# Patient Record
Sex: Male | Born: 1955
Health system: Southern US, Community
[De-identification: ages and names within clinical notes are randomized; demographics above are authoritative.]

## PROBLEM LIST (undated history)

## (undated) DIAGNOSIS — Z972 Presence of dental prosthetic device (complete) (partial): Secondary | ICD-10-CM

## (undated) DIAGNOSIS — N429 Disorder of prostate, unspecified: Secondary | ICD-10-CM

## (undated) DIAGNOSIS — M549 Dorsalgia, unspecified: Secondary | ICD-10-CM

## (undated) DIAGNOSIS — G4700 Insomnia, unspecified: Secondary | ICD-10-CM

## (undated) DIAGNOSIS — K08109 Complete loss of teeth, unspecified cause, unspecified class: Secondary | ICD-10-CM

## (undated) DIAGNOSIS — H269 Unspecified cataract: Secondary | ICD-10-CM

## (undated) DIAGNOSIS — K635 Polyp of colon: Secondary | ICD-10-CM

## (undated) DIAGNOSIS — J449 Chronic obstructive pulmonary disease, unspecified: Secondary | ICD-10-CM

## (undated) DIAGNOSIS — G479 Sleep disorder, unspecified: Secondary | ICD-10-CM

## (undated) DIAGNOSIS — F17200 Nicotine dependence, unspecified, uncomplicated: Secondary | ICD-10-CM

## (undated) DIAGNOSIS — K4090 Unilateral inguinal hernia, without obstruction or gangrene, not specified as recurrent: Secondary | ICD-10-CM

## (undated) DIAGNOSIS — N400 Enlarged prostate without lower urinary tract symptoms: Secondary | ICD-10-CM

## (undated) DIAGNOSIS — G8929 Other chronic pain: Secondary | ICD-10-CM

## (undated) DIAGNOSIS — Z87891 Personal history of nicotine dependence: Principal | ICD-10-CM

## (undated) DIAGNOSIS — M5136 Other intervertebral disc degeneration, lumbar region: Principal | ICD-10-CM

## (undated) HISTORY — PX: COLONOSCOPY W/ BIOPSIES AND POLYPECTOMY: SHX1376

## (undated) HISTORY — DX: Dorsalgia, unspecified: M54.9

## (undated) HISTORY — DX: Benign prostatic hyperplasia without lower urinary tract symptoms: N40.0

## (undated) HISTORY — DX: Other intervertebral disc degeneration, lumbar region: M51.36

## (undated) HISTORY — DX: Sleep disorder, unspecified: G47.9

## (undated) HISTORY — PX: HERNIA REPAIR: SHX51

## (undated) HISTORY — DX: Insomnia, unspecified: G47.00

## (undated) HISTORY — DX: Other chronic pain: G89.29

## (undated) HISTORY — PX: GANGLION CYST EXCISION: SHX1691

## (undated) HISTORY — DX: Personal history of nicotine dependence: Z87.891

## (undated) HISTORY — PX: TOOTH EXTRACTION: SUR596

## (undated) HISTORY — DX: Polyp of colon: K63.5

## (undated) HISTORY — DX: Disorder of prostate, unspecified: N42.9

## (undated) HISTORY — DX: Nicotine dependence, unspecified, uncomplicated: F17.200

---

## 2002-10-04 DIAGNOSIS — K4090 Unilateral inguinal hernia, without obstruction or gangrene, not specified as recurrent: Secondary | ICD-10-CM

## 2002-10-04 HISTORY — DX: Unilateral inguinal hernia, without obstruction or gangrene, not specified as recurrent: K40.90

## 2014-09-02 ENCOUNTER — Telehealth: Payer: Self-pay | Admitting: Family Medicine

## 2014-09-02 MED ORDER — TAMSULOSIN HCL 0.4 MG PO CAPS: 0.4000 mg | ORAL_CAPSULE | Freq: Every day | ORAL | Status: DC

## 2014-09-02 NOTE — Telephone Encounter (Signed)
I called the pt and he stated he is taking this for prostate problems (difficulty with urine flow) and I informed him Dr Maudie Mercury approved this and the Rx was sent to his pharmacy.

## 2014-09-02 NOTE — Telephone Encounter (Signed)
Please find out what he is taking this for. If for benign prostatic hypertrophy can refill #15, 0 refills to get to appointment but no further refills if not seen that day. Advise he get here 15 minutes prior to that appointment.

## 2014-09-02 NOTE — Telephone Encounter (Signed)
Pt calling to request refill of tamsulosine 0.4 mg.  Pt was informed that since he has not seen Dr. Maudie Mercury yet and will not see her until 09/16/14, she is unable to prescribe medications until he is an established pt.  Pt states he has to have this medication and the doctor he was seeing in [place] will not refill medication.  Pt wants to know what he shall do in the mean time.

## 2014-09-16 ENCOUNTER — Ambulatory Visit (INDEPENDENT_AMBULATORY_CARE_PROVIDER_SITE_OTHER): Payer: Managed Care, Other (non HMO) | Admitting: Family Medicine

## 2014-09-16 ENCOUNTER — Encounter: Payer: Self-pay | Admitting: Family Medicine

## 2014-09-16 VITALS — BP 112/76 | HR 91 | Temp 98.2°F | Ht 72.5 in | Wt 160.9 lb

## 2014-09-16 DIAGNOSIS — Z7689 Persons encountering health services in other specified circumstances: Secondary | ICD-10-CM

## 2014-09-16 DIAGNOSIS — F17200 Nicotine dependence, unspecified, uncomplicated: Secondary | ICD-10-CM | POA: Insufficient documentation

## 2014-09-16 DIAGNOSIS — G4700 Insomnia, unspecified: Secondary | ICD-10-CM

## 2014-09-16 DIAGNOSIS — M5136 Other intervertebral disc degeneration, lumbar region: Secondary | ICD-10-CM

## 2014-09-16 DIAGNOSIS — N400 Enlarged prostate without lower urinary tract symptoms: Secondary | ICD-10-CM

## 2014-09-16 DIAGNOSIS — G8929 Other chronic pain: Secondary | ICD-10-CM

## 2014-09-16 DIAGNOSIS — Z720 Tobacco use: Secondary | ICD-10-CM

## 2014-09-16 DIAGNOSIS — Z7189 Other specified counseling: Secondary | ICD-10-CM

## 2014-09-16 DIAGNOSIS — M549 Dorsalgia, unspecified: Secondary | ICD-10-CM

## 2014-09-16 DIAGNOSIS — M51369 Other intervertebral disc degeneration, lumbar region without mention of lumbar back pain or lower extremity pain: Secondary | ICD-10-CM

## 2014-09-16 DIAGNOSIS — Z23 Encounter for immunization: Secondary | ICD-10-CM

## 2014-09-16 DIAGNOSIS — M545 Low back pain: Secondary | ICD-10-CM

## 2014-09-16 HISTORY — DX: Other intervertebral disc degeneration, lumbar region: M51.36

## 2014-09-16 HISTORY — DX: Benign prostatic hyperplasia without lower urinary tract symptoms: N40.0

## 2014-09-16 HISTORY — DX: Other chronic pain: G89.29

## 2014-09-16 HISTORY — DX: Insomnia, unspecified: G47.00

## 2014-09-16 HISTORY — DX: Nicotine dependence, unspecified, uncomplicated: F17.200

## 2014-09-16 HISTORY — DX: Other intervertebral disc degeneration, lumbar region without mention of lumbar back pain or lower extremity pain: M51.369

## 2014-09-16 MED ORDER — BUPROPION HCL ER (SR) 150 MG PO TB12: 150.0000 mg | ORAL_TABLET | Freq: Every day | ORAL | Status: DC

## 2014-09-16 NOTE — Progress Notes (Signed)
Pre visit review using our clinic review tool, if applicable. No additional management support is needed unless otherwise documented below in the visit note. 

## 2014-09-16 NOTE — Addendum Note (Signed)
Addended by: Agnes Lawrence on: 09/16/2014 03:39 PM   Modules accepted: Orders

## 2014-09-16 NOTE — Patient Instructions (Addendum)
BEFORE YOU LEAVE: -flu shot -schedule physical exam in the spring  Congratulation on deciding to quit smoking! -start the wellbutrin once daily -stop smoking 1 week after starting wellbutrin -stop wellbutrin 3 months later  -We placed a referral for you as discussed to the orthopedic doctor for you history of disc disease and chronic back pain. I do not prescribe tramadol or opoid pain medications for chronic back pain. It usually takes about 1-2 weeks to process and schedule this referral. If you have not heard from [place] regarding this appointment in 2 weeks please contact our office.  -Please try to taper off of the xanax slowly and seek care with a psychiatrist if unable to stop this medication. I do not prescribe this medication for sleep or for chronic use.See below for other safer options for sleep.  We recommend the following healthy lifestyle measures: - eat a healthy diet consisting of lots of vegetables, fruits, beans, nuts, seeds, healthy meats such as white chicken and fish and whole grains.  - avoid fried foods, fast food, processed foods, sodas, red meet and other fattening foods.  - get a least 150 minutes of aerobic exercise per week.   We advise that you please obtain and provide [place] with the following so that we can provide you with the best care possible:  NOTE: please provide ONLY the requested documents and reserve a copy for yourself  -copies of all vaccines -reports from any heart studies, EKGs, xrays, MRIs, CT scans, ultrasounds or biopsies -any lab tests done in the last 2 years -reports from any gastrointestinal studies such as colonoscopy or endoscopy -physical exam doctor notes from your last physical -the most recent office visit note from any specialist you have seen  Thank you.  FOR IMPROVED SLEEP AND TO RESET YOUR SLEEP SCHEDULE: []  exercise 30 minutes daily  []  go to bed and wake up at the same time  []  keep bedroom cool, dark and quiet  []  reserve  bed for sleep - do not read, watch TV, etc in bed  []  If you toss and turn more then 15-20 minutes get out of bed and list thoughts/do quite activity then go back to bed; repeat as needed; do not worry about when you eventually fall asleep - still get up at the same time and turn on lights and take shower  [] get counseling  []  some people find that a half dose of benadryl, melatonin, tylenol pm or unisom on a few nights per week is helpful initially for a few weeks  [] seek help and treat any depression or anxiety  [] prescription strength sleep medications should only be used in severe cases of insomnia if other measures fail and should be used sparingly

## 2014-09-16 NOTE — Progress Notes (Signed)
HPI:  Don Christian is here to establish care. New to [place] from [place]. Reports was doing labs and physical yearly.  Had back injury at work in 2012 lifting something. Reports disabled for this. Reports has DDD. Chronic low back pain on Tramadol for this. Worse with sitting and driving. Occ pain in L leg. Denies weakness of numbness, bowel or bladder incontinence. Takes flexeril 2 times per day and tramadol 50mg  bid and ibuprofen bid. Seems like helps a little.  Tobacco Use: -smokes about 3 cigars per day -wants to take wellbutrin to help him quit -started in 2012, in the past had smoke for 20 years -he was getting a low dose CT scan yearly - parents and sister were smokers and died of lung cancer - reports last scan in the spring 2015  Insomnia: -takes alprazolam 0.25mg  5 nights per week -reports possible mild depression -mild anxiety but every once and awhile  BPH: -uses flomax for this -was seeing a urologist for this and wants to see a urologist here - wants referral to urologist next visit  Has the following chronic problems that require follow up and concerns today:   ROS negative for unless reported above: fevers, unintentional weight loss, hearing or vision loss, chest pain, palpitations, struggling to breath, hemoptysis, melena, hematochezia, hematuria, falls, loc, si, thoughts of self harm  Past Medical History  Diagnosis Date  . Depression   . Colon polyps   . Sleeping difficulties   . Prostate disorder   . DDD (degenerative disc disease), lumbar 09/16/2014  . BPH (benign prostatic hyperplasia) 09/16/2014  . Tobacco use disorder 09/16/2014  . Insomnia 09/16/2014  . Chronic back pain 09/16/2014    Past Surgical History  Procedure Laterality Date  . Hernia repair      Family History  Problem Relation Age of Onset  . Cancer Mother   . Heart disease Mother   . Cancer Father   . Cancer Sister     History   Social History  . Marital Status:  Unknown    Spouse Name: N/A    Number of Children: N/A  . Years of Education: N/A   Social History Main Topics  . Smoking status: Current Every Day Smoker  . Smokeless tobacco: None  . Alcohol Use: 0.0 oz/week    0 Not specified per week     Comment: 4-5 drinks 2 days per week  . Drug Use: No  . Sexual Activity: None   Other Topics Concern  . None   Social History Narrative   Work or School: no work currently, disabled from back injury in 2012      Home Situation: lives with wife      Spiritual Beliefs: none      Lifestyle: walks dog 2x per day;  Healthy diet per his report          Current outpatient prescriptions: ALPRAZolam (XANAX) 0.25 MG tablet, Take 0.25 mg by mouth at bedtime., Disp: , Rfl: ;  cyclobenzaprine (FLEXERIL) 10 MG tablet, Take 10 mg by mouth 3 (three) times daily as needed for muscle spasms., Disp: , Rfl: ;  ibuprofen (ADVIL,MOTRIN) 800 MG tablet, Take 800 mg by mouth 2 (two) times daily., Disp: , Rfl:  tamsulosin (FLOMAX) 0.4 MG CAPS capsule, Take 1 capsule (0.4 mg total) by mouth daily., Disp: 15 capsule, Rfl: 0;  traMADol (ULTRAM) 50 MG tablet, Take by mouth 3 (three) times daily. Back pain, Disp: , Rfl: ;  buPROPion (WELLBUTRIN SR) 150  MG 12 hr tablet, Take 1 tablet (150 mg total) by mouth daily., Disp: 90 tablet, Rfl: 0  EXAM:  Filed Vitals:   09/16/14 1428  BP: 112/76  Pulse: 91  Temp: 98.2 F (36.8 C)    Body mass index is 21.51 kg/(m^2).  GENERAL: vitals reviewed and listed above, alert, oriented, appears well hydrated and in no acute distress  HEENT: atraumatic, conjunttiva clear, no obvious abnormalities on inspection of external nose and ears  NECK: no obvious masses on inspection  LUNGS: clear to auscultation bilaterally, no wheezes, rales or rhonchi, good air movement  CV: HRRR, no peripheral edema  MS: moves all extremities without noticeable abnormality  PSYCH: pleasant and cooperative, no obvious depression or  anxiety  ASSESSMENT AND PLAN:  Discussed the following assessment and plan:  DDD (degenerative disc disease), lumbar - Plan: Ambulatory referral to Orthopedic Surgery Chronic low back pain - Plan: Ambulatory referral to Orthopedic Surgery -referred to ortho for is chronic debilitating back pain, advised I do not prescribe tramadol or other opoids for chronic use in adults -advised he obtain records  Tobacco use disorder - Plan: buPROPion (WELLBUTRIN SR) 150 MG 12 hr tablet -advised to quit, counseled 3-10 minutes -wellbutrin, supported  Encounter to establish care  -We reviewed the PMH, PSH, FH, SH, Meds and Allergies. -We provided refills for any medications we will prescribe as needed. -We addressed current concerns per orders and patient instructions. -We have asked for records for pertinent exams, studies, vaccines and notes from previous providers. -We have advised patient to follow up per instructions below.   -Patient advised to return or notify a doctor immediately if symptoms worsen or persist or new concerns arise.  Patient Instructions  BEFORE YOU LEAVE: -flu shot -schedule physical exam in the spring  Congratulation on deciding to quit smoking! -start the wellbutrin once daily -stop smoking 1 week after starting wellbutrin -stop wellbutrin 3 months later  -We placed a referral for you as discussed to the orthopedic doctor for you history of disc disease and chronic back pain. I do not prescribe tramadol or opoid pain medications for chronic back pain. It usually takes about 1-2 weeks to process and schedule this referral. If you have not heard from [place] regarding this appointment in 2 weeks please contact our office.  -Please try to taper off of the xanax slowly and seek care with a psychiatrist if unable to stop this medication. I do not prescribe this medication for sleep or for chronic use.See below for other safer options for sleep.  We recommend the following  healthy lifestyle measures: - eat a healthy diet consisting of lots of vegetables, fruits, beans, nuts, seeds, healthy meats such as white chicken and fish and whole grains.  - avoid fried foods, fast food, processed foods, sodas, red meet and other fattening foods.  - get a least 150 minutes of aerobic exercise per week.   We advise that you please obtain and provide [place] with the following so that we can provide you with the best care possible:  NOTE: please provide ONLY the requested documents and reserve a copy for yourself  -copies of all vaccines -reports from any heart studies, EKGs, xrays, MRIs, CT scans, ultrasounds or biopsies -any lab tests done in the last 2 years -reports from any gastrointestinal studies such as colonoscopy or endoscopy -physical exam doctor notes from your last physical -the most recent office visit note from any specialist you have seen  Thank you.  FOR IMPROVED  SLEEP AND TO RESET YOUR SLEEP SCHEDULE: []  exercise 30 minutes daily  []  go to bed and wake up at the same time  []  keep bedroom cool, dark and quiet  []  reserve bed for sleep - do not read, watch TV, etc in bed  []  If you toss and turn more then 15-20 minutes get out of bed and list thoughts/do quite activity then go back to bed; repeat as needed; do not worry about when you eventually fall asleep - still get up at the same time and turn on lights and take shower  [] get counseling  []  some people find that a half dose of benadryl, melatonin, tylenol pm or unisom on a few nights per week is helpful initially for a few weeks  [] seek help and treat any depression or anxiety  [] prescription strength sleep medications should only be used in severe cases of insomnia if other measures fail and should be used sparingly             KIM, Jarrett Soho R.

## 2014-09-17 ENCOUNTER — Telehealth: Payer: Self-pay | Admitting: Family Medicine

## 2014-09-17 ENCOUNTER — Other Ambulatory Visit: Payer: Self-pay | Admitting: Family Medicine

## 2014-09-17 NOTE — Telephone Encounter (Signed)
emmi emailed °

## 2014-09-17 NOTE — Telephone Encounter (Signed)
Dr. Maudie Mercury, okay to refill Tamsulosin?

## 2014-09-26 ENCOUNTER — Telehealth: Payer: Self-pay | Admitting: Family Medicine

## 2014-09-26 MED ORDER — TAMSULOSIN HCL 0.4 MG PO CAPS: 0.4000 mg | ORAL_CAPSULE | Freq: Every day | ORAL | Status: DC

## 2014-09-26 NOTE — Telephone Encounter (Signed)
1.Pt states he takes tamsulosin (FLOMAX) 0.4 MG CAPS capsule daily (for his prostate) and this rx done only for 15 days. pls advise. [facility]/ [place]  2. Pt states dr Maudie Mercury also reccommended a massage therapist for him. Pt does not remember and hopes dr Maudie Mercury can. pls advise.

## 2014-09-26 NOTE — Telephone Encounter (Signed)
Patient informed. 

## 2014-09-26 NOTE — Telephone Encounter (Signed)
flomax sent - not sure why only 15 sent when refilled. Please let him know have heard good things about [employer] [employer] for massage therapy. Thanks.

## 2014-10-17 ENCOUNTER — Other Ambulatory Visit: Payer: Self-pay | Admitting: Neurological Surgery

## 2014-10-17 DIAGNOSIS — M545 Low back pain: Secondary | ICD-10-CM

## 2014-11-04 ENCOUNTER — Other Ambulatory Visit: Payer: Self-pay

## 2014-11-15 ENCOUNTER — Other Ambulatory Visit: Payer: Self-pay | Admitting: Neurological Surgery

## 2014-11-27 ENCOUNTER — Inpatient Hospital Stay (HOSPITAL_COMMUNITY)
Admission: RE | Admit: 2014-11-27 | Discharge: 2014-11-27 | Disposition: A | Discharge status: Home / Self Care | Payer: Self-pay | Source: Ambulatory Visit

## 2014-11-27 ENCOUNTER — Other Ambulatory Visit (HOSPITAL_COMMUNITY): Payer: Self-pay | Admitting: *Deleted

## 2014-11-27 NOTE — Pre-Procedure Instructions (Signed)
Don Christian  11/27/2014   Your procedure is scheduled on:  Thursday, December 05, 2014 at 7:30 AM.   Report to [facility] Entrance "A" Admitting Office at 5:30 AM.   Call this number if you have problems the morning of surgery: [phone]               Any questions prior to day of surgery, please call [phone] between 8 & 4 PM.   Remember:   Do not eat food or drink liquids after midnight Wednesday, 12/04/14.   Take these medicines the morning of surgery with A SIP OF WATER: Bupropion (Wellbutrin SR), Tamsulosin (Flomax), Tramadol - if needed, Cyclobenzaprine (Flexeril) - if needed.  Stop NSAIDS (Ibuprofen, Aleve, etc.) as of today.   Do not wear jewelry.  Do not wear lotions, powders, or cologne. You may wear deodorant.  Men may shave face and neck.  Do not bring valuables to the hospital.  [facility] is not responsible                  for any belongings or valuables.               Contacts, dentures or bridgework may not be worn into surgery.  Leave suitcase in the car. After surgery it may be brought to your room.  For patients admitted to the hospital, discharge time is determined by your                treatment team.               Special Instructions: [place] - Preparing for Surgery  Before surgery, you can play an important role.  Because skin is not sterile, your skin needs to be as free of germs as possible.  You can reduce the number of germs on you skin by washing with CHG (chlorahexidine gluconate) soap before surgery.  CHG is an antiseptic cleaner which kills germs and bonds with the skin to continue killing germs even after washing.  Please DO NOT use if you have an allergy to CHG or antibacterial soaps.  If your skin becomes reddened/irritated stop using the CHG and inform your nurse when you arrive at Short Stay.  Do not shave (including legs and underarms) for at least 48 hours prior to the first CHG shower.  You may shave your face.  Please  follow these instructions carefully:   1.  Shower with CHG Soap the night before surgery and the                                morning of Surgery.  2.  If you choose to wash your hair, wash your hair first as usual with your       normal shampoo.  3.  After you shampoo, rinse your hair and body thoroughly to remove the                      Shampoo.  4.  Use CHG as you would any other liquid soap.  You can apply chg directly       to the skin and wash gently with scrungie or a clean washcloth.  5.  Apply the CHG Soap to your body ONLY FROM THE NECK DOWN.        Do not use on open wounds or open sores.  Avoid contact with your eyes, ears,  mouth and genitals (private parts).  Wash genitals (private parts) with your normal soap.  6.  Wash thoroughly, paying special attention to the area where your surgery        will be performed.  7.  Thoroughly rinse your body with warm water from the neck down.  8.  DO NOT shower/wash with your normal soap after using and rinsing off       the CHG Soap.  9.  Pat yourself dry with a clean towel.            10.  Wear clean pajamas.            11.  Place clean sheets on your bed the night of your first shower and do not        sleep with pets.  Day of Surgery  Do not apply any lotions the morning of surgery.  Please wear clean clothes to the hospital.     Please read over the following fact sheets that you were given: Pain Booklet, Coughing and Deep Breathing, Blood Transfusion Information, MRSA Information and Surgical Site Infection Prevention

## 2014-12-03 ENCOUNTER — Inpatient Hospital Stay (HOSPITAL_COMMUNITY): Admission: RE | Admit: 2014-12-03 | Payer: Self-pay | Source: Ambulatory Visit

## 2014-12-05 ENCOUNTER — Encounter (HOSPITAL_COMMUNITY): Admission: RE | Payer: Self-pay | Source: Ambulatory Visit

## 2014-12-05 ENCOUNTER — Inpatient Hospital Stay (HOSPITAL_COMMUNITY)
Admission: RE | Admit: 2014-12-05 | Payer: Managed Care, Other (non HMO) | Source: Ambulatory Visit | Admitting: Neurological Surgery

## 2014-12-05 SURGERY — FOR MAXIMUM ACCESS (MAS) POSTERIOR LUMBAR INTERBODY FUSION (PLIF) 1 LEVEL
Anesthesia: General | Site: Back

## 2014-12-16 ENCOUNTER — Telehealth: Payer: Self-pay | Admitting: Family Medicine

## 2014-12-16 NOTE — Telephone Encounter (Signed)
Patient informed of the message and stated he has not seen a psychiatrist and he has an appt on 4/14 for his physical and will discuss this at this time.

## 2014-12-16 NOTE — Telephone Encounter (Signed)
I advised him at his appointment I did not advise this for sleep long term and advised him to taper off and see psychiatry if persistent need for this medication as I do not prescribe. Needs appt if he did not taper off and needs taper prescription.

## 2014-12-16 NOTE — Telephone Encounter (Signed)
Pt request refill ALPRAZolam (XANAX) 0.25 MG tablet, 90 day  Pt takes 1-2 @ bedtime as needed.  Pt last had refilled last august a 90 /day supply. Pt states dr Maudie Mercury aware of this med [facility]. / [place]  [address]

## 2015-01-16 ENCOUNTER — Encounter: Payer: Self-pay | Admitting: Family Medicine

## 2015-01-16 ENCOUNTER — Ambulatory Visit (INDEPENDENT_AMBULATORY_CARE_PROVIDER_SITE_OTHER): Payer: Managed Care, Other (non HMO) | Admitting: Family Medicine

## 2015-01-16 VITALS — BP 132/90 | HR 74 | Temp 98.4°F | Ht 72.5 in | Wt 160.2 lb

## 2015-01-16 DIAGNOSIS — J449 Chronic obstructive pulmonary disease, unspecified: Secondary | ICD-10-CM

## 2015-01-16 DIAGNOSIS — R739 Hyperglycemia, unspecified: Secondary | ICD-10-CM

## 2015-01-16 DIAGNOSIS — E785 Hyperlipidemia, unspecified: Secondary | ICD-10-CM | POA: Diagnosis not present

## 2015-01-16 DIAGNOSIS — Z720 Tobacco use: Secondary | ICD-10-CM | POA: Diagnosis not present

## 2015-01-16 DIAGNOSIS — N400 Enlarged prostate without lower urinary tract symptoms: Secondary | ICD-10-CM

## 2015-01-16 DIAGNOSIS — F17200 Nicotine dependence, unspecified, uncomplicated: Secondary | ICD-10-CM

## 2015-01-16 DIAGNOSIS — M5136 Other intervertebral disc degeneration, lumbar region: Secondary | ICD-10-CM | POA: Diagnosis not present

## 2015-01-16 DIAGNOSIS — Z0000 Encounter for general adult medical examination without abnormal findings: Secondary | ICD-10-CM

## 2015-01-16 LAB — HEMOGLOBIN A1C: Hgb A1c MFr Bld: 5.8 % (ref 4.6–6.5)

## 2015-01-16 LAB — LIPID PANEL
CHOL/HDL RATIO: 4
Cholesterol: 211 mg/dL — ABNORMAL HIGH (ref 0–200)
HDL: 47.6 mg/dL (ref >39.00)
LDL CALC: 148 mg/dL — AB (ref 0–99)
NonHDL: 163.4
Triglycerides: 75 mg/dL (ref 0.0–149.0)
VLDL: 15 mg/dL (ref 0.0–40.0)

## 2015-01-16 LAB — PSA: PSA: 1.17 ng/mL (ref 0.10–4.00)

## 2015-01-16 NOTE — Patient Instructions (Signed)
BEFORE YOU LEAVE: -labs -schedule follow up in 4-6 months  -We placed a referral for you as discussed to the urologist and to for the CT scan for the smoking. It usually takes about 1-2 weeks to process and schedule this referral. If you have not heard from [place] regarding this appointment in 2 weeks please contact our office.  We recommend the following healthy lifestyle measures: - eat a healthy diet consisting of lots of vegetables, fruits, beans, nuts, seeds, healthy meats such as white chicken and fish and whole grains.  - avoid fried foods, fast food, processed foods, sodas, red meet and other fattening foods.  - get a least 150 minutes of aerobic exercise per week.   Please quit smoking. Please start the wellbutrin 1 week prior to quitting. Good luck.

## 2015-01-16 NOTE — Progress Notes (Signed)
HPI:  Here for CPE:  -Concerns and/or follow up today:   URI: -started about 3 days ago -symptoms: congestion, cough, PND, subjective low grade fever initially - now doing better -denies: SOB, wheezing  Chronic Back Pain/DDD: -reports he is seeing specialists for this - Dr. Alfonso Ramus and Dr. Ronnald Ramp and may have back surgery -back injury at work in 2012 lifting something.  -Reports disabled for this -Chronic low back pain, Worse with sitting and driving. Occ pain in L leg.  -Denies weakness of numbness, bowel or bladder incontinence.  -reports taking ibuprofen and prn percocet with dr Ronnald Ramp currently -advised at initial visit to see ortho and/or pain clinic for treatmentt  Tobacco Use/COPD: -smokes about 3 cigars per day -started in 2012, in the past had smoked for 20 years > 1ppd -he was getting a low dose CT scan yearly - parents and sister were smokers and died of lung cancer - reports last scan in the spring 2015 -reports prior PCP diagnosed him with COPD but has not required any medications for this and denies SOB, wheezing, cough -wants to set up next low dose CT -he has rx for wellbutrin I provided but has not started - trying to pick a date to do this with his wife  Insomnia: -took alprazolam 0.25mg  5 nights per week for sleep - I told him I don't advise this, certainty not first line for sleep -reports possible mild depression -mild anxiety but every once and awhile  Hx abd wall hernia: -reports occ when raking leaves notices pain in L mid abdomen and wants to check for hernia -reports hx hernia repair in another location -denies bulge, sig pain, persistent pain  BPH: -uses flomax for this -was seeing a urologist for this and wants to see a urologist here - wants referral to urologist next visit -he has wanted referral last visit but appears did not get done - will resend referral  -Diet: variety of foods, balance and well rounded, larger portion sizes  -Exercise:  no regular exercise  -Diabetes and Dyslipidemia Screening: labs today  -Hx of HTN: no  -Vaccines: UTD  -sexual activity: yes, male partner, no new partners  -wants STI testing, Hep C screening (if born 24-1966): no  -FH colon or prstate ca: see FH Last colon cancer screening: had at age 36 per his report normal and told needs in 2017 Last prostate ca screening: had last year, does with urologist - reports elevated - then normalized, wants to see urology here  -Alcohol, Tobacco, drug use: see social history  Review of Systems - no fevers, unintentional weight loss, vision loss, hearing loss, chest pain, sob, hemoptysis, melena, hematochezia, hematuria, genital discharge, changing or concerning skin lesions, bleeding, bruising, loc, thoughts of self harm or SI  Past Medical History  Diagnosis Date  . Depression   . Colon polyps   . Sleeping difficulties   . Prostate disorder   . DDD (degenerative disc disease), lumbar 09/16/2014  . BPH (benign prostatic hyperplasia) 09/16/2014  . Tobacco use disorder 09/16/2014  . Insomnia 09/16/2014  . Chronic back pain 09/16/2014    Past Surgical History  Procedure Laterality Date  . Hernia repair      Family History  Problem Relation Age of Onset  . Cancer Mother   . Heart disease Mother   . Cancer Father   . Cancer Sister     History   Social History  . Marital Status: Married    Spouse Name: N/A  .  Number of Children: N/A  . Years of Education: N/A   Social History Main Topics  . Smoking status: Current Every Day Smoker  . Smokeless tobacco: Not on file  . Alcohol Use: 0.0 oz/week    0 Standard drinks or equivalent per week     Comment: 4-5 drinks 2 days per week  . Drug Use: No  . Sexual Activity: Not on file   Other Topics Concern  . None   Social History Narrative   Work or School: no work currently, disabled from back injury in 2012      Home Situation: lives with wife      Spiritual Beliefs: none       Lifestyle: walks dog 2x per day;  Healthy diet per his report           Current outpatient prescriptions:  .  buPROPion (WELLBUTRIN SR) 150 MG 12 hr tablet, Take 1 tablet (150 mg total) by mouth daily., Disp: 90 tablet, Rfl: 0 .  ibuprofen (ADVIL,MOTRIN) 800 MG tablet, Take 800 mg by mouth 2 (two) times daily as needed (pain). , Disp: , Rfl:  .  oxyCODONE-acetaminophen (PERCOCET/ROXICET) 5-325 MG per tablet, 1 tablet as needed. , Disp: , Rfl: 0 .  tamsulosin (FLOMAX) 0.4 MG CAPS capsule, Take 1 capsule (0.4 mg total) by mouth daily., Disp: 90 capsule, Rfl: 1  EXAM:  Filed Vitals:   01/16/15 0930  BP: 132/90  Pulse: 74  Temp: 98.4 F (36.9 C)  TempSrc: Oral  Height: 6' 0.5" (1.842 m)  Weight: 160 lb 3.2 oz (72.666 kg)  SpO2: 98%    Estimated body mass index is 21.42 kg/(m^2) as calculated from the following:   Height as of this encounter: 6' 0.5" (1.842 m).   Weight as of this encounter: 160 lb 3.2 oz (72.666 kg).  GENERAL: vitals reviewed and listed below, alert, oriented, appears well hydrated and in no acute distress  HEENT: head atraumatic, PERRLA, normal appearance of eyes, ears, nose and mouth. moist mucus membranes.  NECK: supple, no masses or lymphadenopathy  LUNGS: clear to auscultation bilaterally, no rales, rhonchi or wheeze  CV: HRRR, no peripheral edema or cyanosis, normal pedal pulses  ABDOMEN: bowel sounds normal, soft, non tender to palpation, no masses, no rebound or guarding, no hernia appreciated with valsalva or at rest  GU: deferred  RECTAL: deferred  SKIN: no rash or abnormal lesions  MS: normal gait, moves all extremities normally  NEURO: CN II-XII grossly intact, normal muscle strength and sensation to light touch on extremities  PSYCH: normal affect, pleasant and cooperative  ASSESSMENT AND PLAN:  Discussed the following assessment and plan:  BPH (benign prostatic hyperplasia) - Plan: Ambulatory referral to Urology, PSA  Visit for  preventive health examination - Plan: Lipid panel, Hemoglobin A1c, PSA  Tobacco use disorder - Plan: CT CHEST LUNG CA SCREEN LOW DOSE W/O CM  DDD (degenerative disc disease), lumbar - managed by his specialists  Chronic obstructive pulmonary disease, unspecified COPD, unspecified chronic bronchitis type - strongly advised to quit smoking  Hyperglycemia - Plan: Hemoglobin A1c  Hyperlipemia - Plan: Lipid panel   -Discussed and advised all [place] preventive services health task force level A and B recommendations for age, sex and risks.  -Advised at least 150 minutes of exercise per week and a healthy diet low in saturated fats and sweets and consisting of fresh fruits and vegetables, lean meats such as fish and white chicken and whole grains.  -declined STI testing  -  advised to quit smoking, he has wellbutrin  -FASTING labs, studies and vaccines per orders this encounter   Patient advised to return to clinic immediately if symptoms worsen or persist or new concerns.  Patient Instructions  BEFORE YOU LEAVE: -labs -schedule follow up in 4-6 months  -We placed a referral for you as discussed to the urologist and to for the CT scan for the smoking. It usually takes about 1-2 weeks to process and schedule this referral. If you have not heard from [place] regarding this appointment in 2 weeks please contact our office.  We recommend the following healthy lifestyle measures: - eat a healthy diet consisting of lots of vegetables, fruits, beans, nuts, seeds, healthy meats such as white chicken and fish and whole grains.  - avoid fried foods, fast food, processed foods, sodas, red meet and other fattening foods.  - get a least 150 minutes of aerobic exercise per week.   Please quit smoking. Please start the wellbutrin 1 week prior to quitting. Good luck.      No Follow-up on file.   Don Benton R.

## 2015-01-16 NOTE — Progress Notes (Signed)
Pre visit review using our clinic review tool, if applicable. No additional management support is needed unless otherwise documented below in the visit note. 

## 2015-01-20 ENCOUNTER — Telehealth: Payer: Self-pay | Admitting: Acute Care

## 2015-01-20 ENCOUNTER — Encounter: Payer: Self-pay | Admitting: Acute Care

## 2015-01-20 NOTE — Telephone Encounter (Signed)
This patient was called on Friday 01/17/15 after being referred for screening by  Goliad Brassfield. He does qualify for the lung cancer screening program.He stated that he would prefer to participate in this screening at the [facility] location, as this is closer to his home. I have contacted Burgess Estelle and Georgeanne Nim from [facility], and  shared his patient  information with them on 01/17/15. They will call the patient and arrange for him to be screened and followed at their site. Don Christian verbalized understanding of this. He has my contact information should he have any further questions.

## 2015-01-28 ENCOUNTER — Ambulatory Visit
Admit: 2015-01-28 | Disposition: A | Discharge status: Home / Self Care | Payer: Self-pay | Attending: Family Medicine | Admitting: Family Medicine

## 2015-01-28 DIAGNOSIS — F17210 Nicotine dependence, cigarettes, uncomplicated: Secondary | ICD-10-CM | POA: Diagnosis not present

## 2015-01-28 DIAGNOSIS — Z122 Encounter for screening for malignant neoplasm of respiratory organs: Secondary | ICD-10-CM | POA: Diagnosis not present

## 2015-01-31 ENCOUNTER — Ambulatory Visit
Admit: 2015-01-31 | Disposition: A | Discharge status: Home / Self Care | Payer: Self-pay | Attending: Family Medicine | Admitting: Family Medicine

## 2015-01-31 DIAGNOSIS — Z122 Encounter for screening for malignant neoplasm of respiratory organs: Secondary | ICD-10-CM | POA: Diagnosis not present

## 2015-01-31 DIAGNOSIS — F17210 Nicotine dependence, cigarettes, uncomplicated: Secondary | ICD-10-CM | POA: Diagnosis not present

## 2015-02-03 NOTE — Progress Notes (Signed)
Notified patient of LDCT lung cancer screening results of Lung Rads 2 Finding with recommendation for 12 month follow up imaging. Also notified of incidental finding of emphysematous and atherosclerotic changes.

## 2015-02-04 ENCOUNTER — Encounter: Payer: Self-pay | Admitting: *Deleted

## 2015-02-21 ENCOUNTER — Other Ambulatory Visit: Payer: Self-pay | Admitting: Neurological Surgery

## 2015-02-21 DIAGNOSIS — M545 Low back pain: Secondary | ICD-10-CM

## 2015-02-25 DIAGNOSIS — N138 Other obstructive and reflux uropathy: Secondary | ICD-10-CM | POA: Diagnosis not present

## 2015-02-25 DIAGNOSIS — N401 Enlarged prostate with lower urinary tract symptoms: Secondary | ICD-10-CM | POA: Diagnosis not present

## 2015-02-25 DIAGNOSIS — R1030 Lower abdominal pain, unspecified: Secondary | ICD-10-CM | POA: Diagnosis not present

## 2015-02-27 ENCOUNTER — Ambulatory Visit (INDEPENDENT_AMBULATORY_CARE_PROVIDER_SITE_OTHER): Payer: Managed Care, Other (non HMO) | Admitting: Psychiatry

## 2015-02-27 DIAGNOSIS — F064 Anxiety disorder due to known physiological condition: Secondary | ICD-10-CM | POA: Diagnosis not present

## 2015-03-04 ENCOUNTER — Ambulatory Visit
Admission: RE | Admit: 2015-03-04 | Discharge: 2015-03-04 | Disposition: A | Discharge status: Home / Self Care | Payer: Medicare Other | Source: Ambulatory Visit | Attending: Neurological Surgery | Admitting: Neurological Surgery

## 2015-03-04 ENCOUNTER — Other Ambulatory Visit: Payer: Self-pay | Admitting: Neurological Surgery

## 2015-03-04 VITALS — BP 140/83 | HR 75 | Temp 97.7°F | Resp 10

## 2015-03-04 DIAGNOSIS — M545 Low back pain: Secondary | ICD-10-CM

## 2015-03-04 DIAGNOSIS — M5136 Other intervertebral disc degeneration, lumbar region: Secondary | ICD-10-CM

## 2015-03-04 DIAGNOSIS — M47816 Spondylosis without myelopathy or radiculopathy, lumbar region: Secondary | ICD-10-CM | POA: Diagnosis not present

## 2015-03-04 MED ORDER — MIDAZOLAM HCL 2 MG/2ML IJ SOLN
1.0000 mg | INTRAMUSCULAR | Status: DC | PRN
  Administered 2015-03-04: 1 mg via INTRAVENOUS

## 2015-03-04 MED ORDER — KETOROLAC TROMETHAMINE 30 MG/ML IJ SOLN
30.0000 mg | Freq: Once | INTRAMUSCULAR | Status: AC
  Administered 2015-03-04: 30 mg via INTRAVENOUS

## 2015-03-04 MED ORDER — CEFAZOLIN SODIUM-DEXTROSE 2-3 GM-% IV SOLR
2.0000 g | Freq: Once | INTRAVENOUS | Status: AC
  Administered 2015-03-04: 2 g via INTRAVENOUS

## 2015-03-04 MED ORDER — FENTANYL CITRATE (PF) 100 MCG/2ML IJ SOLN
25.0000 ug | INTRAMUSCULAR | Status: DC | PRN
  Administered 2015-03-04: 100 ug via INTRAVENOUS

## 2015-03-04 MED ORDER — SODIUM CHLORIDE 0.9 % IV SOLN
Freq: Once | INTRAVENOUS | Status: AC
  Administered 2015-03-04: 08:00:00 via INTRAVENOUS

## 2015-03-04 MED ORDER — IOHEXOL 180 MG/ML  SOLN: 3.0000 mL | Freq: Once | INTRAMUSCULAR | Status: AC | PRN

## 2015-03-04 MED ORDER — ONDANSETRON HCL 4 MG/2ML IJ SOLN: 4.0000 mg | Freq: Four times a day (QID) | INTRAMUSCULAR | Status: DC | PRN

## 2015-03-04 NOTE — Discharge Instructions (Signed)
Discogram Post Procedure Discharge Instructions ° °1. May resume a regular diet and any medications that you routinely take (including pain medications). °2. No driving day of procedure. °3. Upon discharge go home and rest for at least 4 hours.  May use an ice pack as needed to injection sites on back.  Ice to back 30 minutes on and 30 minutes off, all day. °4. May remove bandades later, today. °5. It is not unusual to be sore for several days after this procedure. ° ° ° °Please contact our office at [phone] for the following symptoms: ° °· Fever greater than 100 degrees °· Increased swelling, pain, or redness at injection site. ° ° °Thank you for visiting [facility]. ° ° °

## 2015-03-24 DIAGNOSIS — M5136 Other intervertebral disc degeneration, lumbar region: Secondary | ICD-10-CM | POA: Diagnosis not present

## 2015-04-08 ENCOUNTER — Other Ambulatory Visit: Payer: Self-pay | Admitting: Family Medicine

## 2015-04-15 ENCOUNTER — Other Ambulatory Visit: Payer: Self-pay | Admitting: Neurological Surgery

## 2015-04-15 ENCOUNTER — Other Ambulatory Visit: Payer: Self-pay

## 2015-04-16 ENCOUNTER — Encounter: Payer: Self-pay | Admitting: Vascular Surgery

## 2015-04-18 ENCOUNTER — Encounter: Payer: Self-pay | Admitting: Vascular Surgery

## 2015-04-22 ENCOUNTER — Encounter: Payer: Self-pay | Admitting: Vascular Surgery

## 2015-04-22 ENCOUNTER — Ambulatory Visit (INDEPENDENT_AMBULATORY_CARE_PROVIDER_SITE_OTHER): Payer: Managed Care, Other (non HMO) | Admitting: Vascular Surgery

## 2015-04-22 VITALS — BP 106/68 | HR 72 | Temp 98.2°F | Resp 18 | Ht 72.0 in | Wt 156.3 lb

## 2015-04-22 DIAGNOSIS — M5440 Lumbago with sciatica, unspecified side: Secondary | ICD-10-CM | POA: Diagnosis not present

## 2015-04-22 NOTE — Progress Notes (Addendum)
[facility] Consult   Referred by:  Lucretia Kern, DO [address] [place], [place] [address]  Reason for referral: Pre-op consult prior to ALIF  History of Present Illness  Don Christian is a 59 y.o. (26-Jan-1956) male who presents with chief complaint: chronic low back pain. He is seen as a pre-operative consult prior to ALIF (L4-L5) with Dr. Ronnald Ramp on 04/22/15. He has had progressive back pain since 2008. He is healthy without any major medical problems. He denies any prior cardiac history. He has had a left inguinal hernia repair over a decade ago. No other abdominal surgeries. He is currently retired and on disability. He hopes to receive his back surgery and return to work. He is a former smoker quitting 1 year ago.   Past Medical History  Diagnosis Date  . Depression   . Colon polyps   . Sleeping difficulties   . Prostate disorder   . DDD (degenerative disc disease), lumbar 09/16/2014  . BPH (benign prostatic hyperplasia) 09/16/2014  . Tobacco use disorder 09/16/2014  . Insomnia 09/16/2014  . Chronic back pain 09/16/2014    Past Surgical History  Procedure Laterality Date  . Hernia repair      History   Social History  . Marital Status: Married    Spouse Name: N/A  . Number of Children: N/A  . Years of Education: N/A   Occupational History  . Not on file.   Social History Main Topics  . Smoking status: Former Smoker -- 1.75 packs/day for 37 years    Types: Cigars    Quit date: 05/23/2014  . Smokeless tobacco: Not on file     Comment: Quit cigarettes 2008, has smoked 3 cigars daily since 2013  . Alcohol Use: 0.0 oz/week    0 Standard drinks or equivalent per week     Comment: occassionally on social occassions  . Drug Use: No  . Sexual Activity: Not on file   Other Topics Concern  . Not on file   Social History Narrative   Work or School: no work currently, disabled from back injury in 2012      Home Situation: lives with wife        Spiritual Beliefs: none      Lifestyle: walks dog 2x per day;  Healthy diet per his report          Family History  Problem Relation Age of Onset  . Cancer Mother   . Heart disease Mother   . Cancer Father   . Cancer Sister     Current Outpatient Prescriptions on File Prior to Visit  Medication Sig Dispense Refill  . ibuprofen (ADVIL,MOTRIN) 800 MG tablet Take 800 mg by mouth 2 (two) times daily as needed (pain).     Marland Kitchen oxyCODONE-acetaminophen (PERCOCET/ROXICET) 5-325 MG per tablet 1 tablet as needed.   0  . tamsulosin (FLOMAX) 0.4 MG CAPS capsule TAKE 1 CAPSULE BY MOUTH EVERY DAY 90 capsule 3  . buPROPion (WELLBUTRIN SR) 150 MG 12 hr tablet Take 1 tablet (150 mg total) by mouth daily. (Patient not taking: Reported on 04/22/2015) 90 tablet 0   No current facility-administered medications on file prior to visit.    No Known Allergies  REVIEW OF SYSTEMS:  (Positives checked otherwise negative)  CARDIOVASCULAR:  []  chest pain, []  chest pressure, []  palpitations, []  shortness of breath when laying flat, []  shortness of breath with exertion,  []  pain in feet when walking, []  pain in feet  when laying flat, []  history of blood clot in veins (DVT), []  history of phlebitis, []  swelling in legs, []  varicose veins  PULMONARY:  []  productive cough, []  asthma, []  wheezing  NEUROLOGIC:  []  weakness in arms or legs, []  numbness in arms or legs, []  difficulty speaking or slurred speech, []  temporary loss of vision in one eye, []  dizziness  HEMATOLOGIC:  []  bleeding problems, []  problems with blood clotting too easily  MUSCULOSKEL:  []  joint pain, []  joint swelling  GASTROINTEST:  []  vomiting blood, []  blood in stool     GENITOURINARY:  []  burning with urination, []  blood in urine  PSYCHIATRIC:  []  history of major depression  INTEGUMENTARY:  []  rashes, []  ulcers  CONSTITUTIONAL:  []  fever, []  chills  Physical Examination  Filed Vitals:   04/22/15 1302  BP: 106/68  Pulse: 72   Temp: 98.2 F (36.8 C)  TempSrc: Oral  Resp: 18  Height: 6' (1.829 m)  Weight: 156 lb 4.8 oz (70.897 kg)  SpO2: 99%    Body mass index is 21.19 kg/(m^2).  General: A&O x 3, WDWN thin male appears stated age  Head: [place]/AT  Pulmonary: Sym exp, good air movt, CTAB, no rales, rhonchi, & wheezing.   Cardiac: RRR, Nl S1, S2, no Murmurs, rubs or gallops, no carotid bruits.   Vascular: Palpable 2+femoral, dorsalis pedis and posterior tibial pulses bilaterally.   Gastrointestinal: soft, NTND, -G/R, - HSM, - masses,   Musculoskeletal: M/S 5/5 throughout, Extremities without ischemic changes  Neurologic: No focal deficits, Pain and light touch intact in extremities  Psychiatric: Judgment intact, Mood & affect appropriate for pt's clinical situation  Dermatologic: See M/S exam for extremity exam, no rashes otherwise noted   Outside Studies/Documentation 6 pages of outside documents were reviewed including: Dr. Ronnald Ramp.  Medical Decision Making  [name] is a 59 y.o. male who presents with chronic low back pain with L4-L5 disc degeneration. His CT Lumbar spine revealed diffuse calcification of his abdominal aorta. Discussed that this is not a contraindication for anterior exposure but may cause mobilizing of his aorta to be more challenging. He appears to be a good candidate for ALIF. The procedure, risks and benefits were explained to the patient at length and he agrees to proceed His ALIF will be scheduled with Dr. Ronnald Ramp on 05/09/15.    Virgina Jock, PA-C [facility] [place] Office: [phone] Pager: [phone]  04/22/2015, 1:32 PM   This patient was seen and examined in conjunction with Dr. Donnetta Hutching.   I have examined the patient, reviewed and agree with above. Discussed my role exposure for L5-S1 disc surgery with Dr. Ronnald Ramp. Splane mobilization of intraperitoneal contents, left ureter and iliac arteries and veins. He does have a normal results  pedis pulses bilaterally. He does have extensive calcification of his aortoiliac segments. Excellent this does put him at some increased risk for injury to the vessels with mobilization but do not feel this is prohibitive risk. He understands wished to proceed with surgery as scheduled on 05/09/2015  Curt Jews, MD 04/22/2015 2:36 PM

## 2015-04-29 ENCOUNTER — Other Ambulatory Visit (HOSPITAL_COMMUNITY): Payer: Self-pay | Admitting: *Deleted

## 2015-04-29 NOTE — Pre-Procedure Instructions (Signed)
    Don Christian  04/29/2015      Your procedure is scheduled on Friday, May 09, 2015 at 7:30 AM.   Report to [facility] Entrance "A" Admitting Office at 5:30 AM.   Call this number if you have problems the morning of surgery: [phone]   Any questions prior to day of surgery, please call [phone] between 8 & 4 PM.   Remember:  Do not eat food or drink liquids after midnight Thursday, 05/08/15.  Take these medicines the morning of surgery with A SIP OF WATER: Flomax, Tramadol or Oxycodone if needed for pain  Stop NSAIDS (Ibuprofen, Aleve, etc.) 7 days prior to surgery.   Do not wear jewelry.  Do not wear lotions, powders, or cologne.  You may wear deodorant.  Men may shave face and neck.  Do not bring valuables to the hospital.  [facility] is not responsible for any belongings or valuables.  Contacts, dentures or bridgework may not be worn into surgery.  Leave your suitcase in the car.  After surgery it may be brought to your room.  For patients admitted to the hospital, discharge time will be determined by your treatment team.  Special instructions:  See "Preparing for Surgery" Instruction sheet.  Please read over the following fact sheets that you were given. Pain Booklet, Coughing and Deep Breathing, Blood Transfusion Information, MRSA Information and Surgical Site Infection Prevention

## 2015-04-30 ENCOUNTER — Inpatient Hospital Stay (HOSPITAL_COMMUNITY)
Admission: RE | Admit: 2015-04-30 | Discharge: 2015-04-30 | Disposition: A | Discharge status: Home / Self Care | Payer: Self-pay | Source: Ambulatory Visit

## 2015-05-06 ENCOUNTER — Telehealth: Payer: Self-pay | Admitting: Family Medicine

## 2015-05-06 NOTE — Telephone Encounter (Addendum)
Pt is having back surgery 8/5 and has not been able to sleep.  Pt states he used to take xanax. Pt states his anxiety level is up as well Pt would like a refill of something that will help to [facility]. [address] [place]

## 2015-05-07 ENCOUNTER — Encounter (HOSPITAL_COMMUNITY): Payer: Self-pay

## 2015-05-07 ENCOUNTER — Encounter (HOSPITAL_COMMUNITY)
Admission: RE | Admit: 2015-05-07 | Discharge: 2015-05-07 | Disposition: A | Discharge status: Home / Self Care | Payer: Managed Care, Other (non HMO) | Source: Ambulatory Visit | Attending: Neurological Surgery | Admitting: Neurological Surgery

## 2015-05-07 ENCOUNTER — Ambulatory Visit (HOSPITAL_COMMUNITY)
Admission: RE | Admit: 2015-05-07 | Discharge: 2015-05-07 | Disposition: A | Discharge status: Home / Self Care | Payer: Managed Care, Other (non HMO) | Source: Ambulatory Visit | Attending: Neurological Surgery | Admitting: Neurological Surgery

## 2015-05-07 DIAGNOSIS — J449 Chronic obstructive pulmonary disease, unspecified: Secondary | ICD-10-CM | POA: Insufficient documentation

## 2015-05-07 DIAGNOSIS — Z01810 Encounter for preprocedural cardiovascular examination: Secondary | ICD-10-CM | POA: Insufficient documentation

## 2015-05-07 DIAGNOSIS — Z87891 Personal history of nicotine dependence: Secondary | ICD-10-CM

## 2015-05-07 DIAGNOSIS — Z01818 Encounter for other preprocedural examination: Secondary | ICD-10-CM | POA: Insufficient documentation

## 2015-05-07 DIAGNOSIS — M5136 Other intervertebral disc degeneration, lumbar region: Secondary | ICD-10-CM | POA: Insufficient documentation

## 2015-05-07 DIAGNOSIS — Z01812 Encounter for preprocedural laboratory examination: Secondary | ICD-10-CM

## 2015-05-07 DIAGNOSIS — N400 Enlarged prostate without lower urinary tract symptoms: Secondary | ICD-10-CM

## 2015-05-07 HISTORY — DX: Complete loss of teeth, unspecified cause, unspecified class: K08.109

## 2015-05-07 HISTORY — DX: Unspecified cataract: H26.9

## 2015-05-07 HISTORY — DX: Chronic obstructive pulmonary disease, unspecified: J44.9

## 2015-05-07 HISTORY — DX: Presence of dental prosthetic device (complete) (partial): Z97.2

## 2015-05-07 HISTORY — DX: Unilateral inguinal hernia, without obstruction or gangrene, not specified as recurrent: K40.90

## 2015-05-07 LAB — SURGICAL PCR SCREEN
MRSA, PCR: NEGATIVE
Staphylococcus aureus: NEGATIVE

## 2015-05-07 LAB — CBC WITH DIFFERENTIAL/PLATELET
Basophils Absolute: 0.1 10*3/uL (ref 0.0–0.1)
Basophils Relative: 1 % (ref 0–1)
EOS ABS: 0.2 10*3/uL (ref 0.0–0.7)
EOS PCT: 2 % (ref 0–5)
HEMATOCRIT: 44.7 % (ref 39.0–52.0)
Hemoglobin: 15 g/dL (ref 13.0–17.0)
LYMPHS ABS: 2.2 10*3/uL (ref 0.7–4.0)
Lymphocytes Relative: 23 % (ref 12–46)
MCH: 30.7 pg (ref 26.0–34.0)
MCHC: 33.6 g/dL (ref 30.0–36.0)
MCV: 91.4 fL (ref 78.0–100.0)
MONO ABS: 0.8 10*3/uL (ref 0.1–1.0)
MONOS PCT: 8 % (ref 3–12)
Neutro Abs: 6.5 10*3/uL (ref 1.7–7.7)
Neutrophils Relative %: 66 % (ref 43–77)
PLATELETS: 233 10*3/uL (ref 150–400)
RBC: 4.89 MIL/uL (ref 4.22–5.81)
RDW: 13.3 % (ref 11.5–15.5)
WBC: 9.7 10*3/uL (ref 4.0–10.5)

## 2015-05-07 LAB — BASIC METABOLIC PANEL
Anion gap: 7 (ref 5–15)
BUN: 8 mg/dL (ref 6–20)
CO2: 29 mmol/L (ref 22–32)
Calcium: 9.7 mg/dL (ref 8.9–10.3)
Chloride: 102 mmol/L (ref 101–111)
Creatinine, Ser: 0.9 mg/dL (ref 0.61–1.24)
GFR calc Af Amer: >60 mL/min (ref >60)
GLUCOSE: 109 mg/dL — AB (ref 65–99)
POTASSIUM: 4.1 mmol/L (ref 3.5–5.1)
Sodium: 138 mmol/L (ref 135–145)

## 2015-05-07 LAB — PROTIME-INR
INR: 1.04 (ref 0.00–1.49)
Prothrombin Time: 13.8 seconds (ref 11.6–15.2)

## 2015-05-07 LAB — ABO/RH: ABO/RH(D): O POS

## 2015-05-07 LAB — TYPE AND SCREEN
ABO/RH(D): O POS
ANTIBODY SCREEN: NEGATIVE

## 2015-05-07 NOTE — Telephone Encounter (Signed)
I called the pt and informed him of the message below and offered an appt and he stated he does not want to come in and will wait as the surgery is on Friday.

## 2015-05-07 NOTE — Pre-Procedure Instructions (Signed)
    Don Christian  05/07/2015      [facility] - [place], [place] - [address] AT [address] [place] [address] Phone: [phone] Fax: [phone]    Your procedure is scheduled on 05/09/2015.  Report to [facility] Admitting at 530 A.M.  Call this number if you have problems in the days leading up to your surgery:  [phone]  Call this number if you have problems the morning of surgery:  [phone]   Remember:  Do not eat food or drink liquids after midnight Thursday August 4th.  Take these medicines the morning of surgery with A SIP OF WATER if needed: Percocet (oxycodone-acetaminophen), Tramadol (ultram), Flomax(Tamsulosin)   Do not wear jewelry, make-up or nail polish.  Do not wear lotions, powders, or perfumes.  You may wear deodorant.  Do not shave 48 hours prior to surgery.  Men may shave face and neck.  Do not bring valuables to the hospital.  [facility] is not responsible for any belongings or valuables.  Contacts, dentures or bridgework may not be worn into surgery.  Leave your suitcase in the car.  After surgery it may be brought to your room.  For patients admitted to the hospital, discharge time will be determined by your treatment team.  Patients discharged the day of surgery will not be allowed to drive home.   Special instructions:  Please follow these instructions carefully:  1. Shower with CHG Soap the night before surgery and the morning of Surgery. 2. If you choose to wash your hair, wash your hair first as usual with your normal shampoo. 3. After you shampoo, rinse your hair and body thoroughly to remove the Shampoo. 4. Use CHG as you would any other liquid soap. You can apply chg directly to the skin and wash gently with scrungie or a clean washcloth. 5. Apply the CHG Soap to your body ONLY FROM THE NECK  DOWN. Do not use on open wounds or open sores. Avoid contact with your eyes, ears, mouth and genitals (private parts). Wash genitals (private parts) with your normal soap. 6. Wash thoroughly, paying special attention to the area where your surgery will be performed. 7. Thoroughly rinse your body with warm water from the neck down. 8. DO NOT shower/wash with your normal soap after using and rinsing off the CHG Soap. 9. Pat yourself dry with a clean towel.  10. Wear clean pajamas.  11. Place clean sheets on your bed the night of your first shower and do not sleep with pets.  Day of Surgery  Do not apply any lotions/deodorants the morning of surgery. Please wear clean clothes to the hospital/surgery center.    Please read over the following fact sheets that you were given. Pain Booklet, Coughing and Deep Breathing, Blood Transfusion Information, MRSA Information and Surgical Site Infection Prevention

## 2015-05-07 NOTE — Telephone Encounter (Signed)
Please let him know he will need an appointment as have not seen him in several months. Also, I do not prescribe this medication for regular use for sleep.

## 2015-05-07 NOTE — Progress Notes (Signed)
Patient's PCP is Dr. Colin Benton in [place], [place] [phone]

## 2015-05-08 NOTE — Progress Notes (Signed)
Anesthesia Chart Review: Patient is a 59 year old male scheduled for L4-5 ALIF on 05/09/15 by Dr. Ronnald Ramp. Anterior exposure by Dr. Donnetta Hutching.  History includes former smoker (quit 05/23/14), BPH, insomnia, COPD, full dentures, hernia repair. BMI is 21. PCP is Dr. Colin Benton, last visit for CPE 01/16/15. She is aware of surgery plans. He moved to [place] from [place] ~ 2015. BP 106/68 at PAT. HR 72.   Meds include Percocet, Flomax, tramadol.  05/07/15 EKG: NSR, septal infarct (age undetermined). No comparison EKG available. No chest pain or SOB reported at PAT or at recent VVS and PCP evaluations. He is a recent former smoker, but no reported CAD/MI, CHF, HTN, DM history.   05/07/15 CXR: IMPRESSION: No active cardiopulmonary disease.  Preoperative labs noted. A1C 5.8 on 01/16/15.   Further evaluation by his assigned anesthesiologist tomorrow. No CV symptoms reported at PAT. No known MI history. If he remains asymptomatic from a CV standpoint then I would anticipate that he could proceed as planned.  [facility] Short Stay Center/Anesthesiology Phone [phone] 05/08/2015 10:24 AM

## 2015-05-09 ENCOUNTER — Inpatient Hospital Stay (HOSPITAL_COMMUNITY): Payer: Managed Care, Other (non HMO)

## 2015-05-09 ENCOUNTER — Inpatient Hospital Stay (HOSPITAL_COMMUNITY): Payer: Managed Care, Other (non HMO) | Admitting: Vascular Surgery

## 2015-05-09 ENCOUNTER — Inpatient Hospital Stay (HOSPITAL_COMMUNITY): Payer: Managed Care, Other (non HMO) | Admitting: Certified Registered"

## 2015-05-09 ENCOUNTER — Encounter (HOSPITAL_COMMUNITY): Payer: Self-pay | Admitting: *Deleted

## 2015-05-09 ENCOUNTER — Inpatient Hospital Stay (HOSPITAL_COMMUNITY)
Admission: RE | Admit: 2015-05-09 | Discharge: 2015-05-10 | DRG: 460 | Disposition: A | Discharge status: Home / Self Care | Payer: Managed Care, Other (non HMO) | Source: Ambulatory Visit | Attending: Neurological Surgery | Admitting: Neurological Surgery

## 2015-05-09 ENCOUNTER — Encounter (HOSPITAL_COMMUNITY)
Admission: RE | Disposition: A | Discharge status: Home / Self Care | Payer: Self-pay | Source: Ambulatory Visit | Attending: Neurological Surgery

## 2015-05-09 DIAGNOSIS — Z01818 Encounter for other preprocedural examination: Secondary | ICD-10-CM

## 2015-05-09 DIAGNOSIS — I708 Atherosclerosis of other arteries: Secondary | ICD-10-CM | POA: Diagnosis present

## 2015-05-09 DIAGNOSIS — M4806 Spinal stenosis, lumbar region: Secondary | ICD-10-CM | POA: Diagnosis present

## 2015-05-09 DIAGNOSIS — I700 Atherosclerosis of aorta: Secondary | ICD-10-CM | POA: Diagnosis present

## 2015-05-09 DIAGNOSIS — Z01810 Encounter for preprocedural cardiovascular examination: Secondary | ICD-10-CM

## 2015-05-09 DIAGNOSIS — Z01812 Encounter for preprocedural laboratory examination: Secondary | ICD-10-CM | POA: Diagnosis not present

## 2015-05-09 DIAGNOSIS — Z981 Arthrodesis status: Secondary | ICD-10-CM

## 2015-05-09 DIAGNOSIS — M5136 Other intervertebral disc degeneration, lumbar region: Secondary | ICD-10-CM | POA: Diagnosis not present

## 2015-05-09 DIAGNOSIS — Z87891 Personal history of nicotine dependence: Secondary | ICD-10-CM

## 2015-05-09 DIAGNOSIS — N400 Enlarged prostate without lower urinary tract symptoms: Secondary | ICD-10-CM | POA: Diagnosis present

## 2015-05-09 DIAGNOSIS — M5116 Intervertebral disc disorders with radiculopathy, lumbar region: Secondary | ICD-10-CM | POA: Diagnosis not present

## 2015-05-09 DIAGNOSIS — Z419 Encounter for procedure for purposes other than remedying health state, unspecified: Secondary | ICD-10-CM

## 2015-05-09 DIAGNOSIS — Z4889 Encounter for other specified surgical aftercare: Secondary | ICD-10-CM | POA: Diagnosis not present

## 2015-05-09 DIAGNOSIS — J449 Chronic obstructive pulmonary disease, unspecified: Secondary | ICD-10-CM | POA: Diagnosis present

## 2015-05-09 HISTORY — DX: Arthrodesis status: Z98.1

## 2015-05-09 HISTORY — PX: ABDOMINAL EXPOSURE: SHX5708

## 2015-05-09 HISTORY — PX: ANTERIOR LUMBAR FUSION: SHX1170

## 2015-05-09 SURGERY — ANTERIOR LUMBAR FUSION 1 LEVEL
Anesthesia: General | Site: Abdomen

## 2015-05-09 MED ORDER — HYDROMORPHONE HCL 1 MG/ML IJ SOLN
INTRAMUSCULAR | Status: AC
  Administered 2015-05-09: 0.5 mg via INTRAVENOUS
  Filled 2015-05-09: qty 1

## 2015-05-09 MED ORDER — CELECOXIB 200 MG PO CAPS
200.0000 mg | ORAL_CAPSULE | Freq: Two times a day (BID) | ORAL | Status: DC
  Administered 2015-05-09 (×2): 200 mg via ORAL
  Filled 2015-05-09 (×2): qty 1

## 2015-05-09 MED ORDER — ONDANSETRON HCL 4 MG/2ML IJ SOLN
4.0000 mg | Freq: Once | INTRAMUSCULAR | Status: AC | PRN
  Administered 2015-05-09: 4 mg via INTRAVENOUS

## 2015-05-09 MED ORDER — SUCCINYLCHOLINE CHLORIDE 20 MG/ML IJ SOLN
INTRAMUSCULAR | Status: AC
  Filled 2015-05-09: qty 1

## 2015-05-09 MED ORDER — HYDROMORPHONE HCL 1 MG/ML IJ SOLN
0.2500 mg | INTRAMUSCULAR | Status: DC | PRN
  Administered 2015-05-09 (×4): 0.5 mg via INTRAVENOUS

## 2015-05-09 MED ORDER — FENTANYL CITRATE (PF) 250 MCG/5ML IJ SOLN
INTRAMUSCULAR | Status: AC
  Filled 2015-05-09: qty 5

## 2015-05-09 MED ORDER — FENTANYL CITRATE (PF) 250 MCG/5ML IJ SOLN
INTRAMUSCULAR | Status: DC | PRN
  Administered 2015-05-09: 50 ug via INTRAVENOUS
  Administered 2015-05-09: 100 ug via INTRAVENOUS
  Administered 2015-05-09: 50 ug via INTRAVENOUS
  Administered 2015-05-09 (×2): 25 ug via INTRAVENOUS

## 2015-05-09 MED ORDER — PHENYLEPHRINE 40 MCG/ML (10ML) SYRINGE FOR IV PUSH (FOR BLOOD PRESSURE SUPPORT)
PREFILLED_SYRINGE | INTRAVENOUS | Status: AC
  Filled 2015-05-09: qty 10

## 2015-05-09 MED ORDER — THROMBIN 5000 UNITS EX SOLR
OROMUCOSAL | Status: DC | PRN
  Administered 2015-05-09: 09:00:00 via TOPICAL

## 2015-05-09 MED ORDER — GLYCOPYRROLATE 0.2 MG/ML IJ SOLN
INTRAMUSCULAR | Status: DC | PRN
  Administered 2015-05-09: 0.4 mg via INTRAVENOUS

## 2015-05-09 MED ORDER — DEXAMETHASONE SODIUM PHOSPHATE 10 MG/ML IJ SOLN
INTRAMUSCULAR | Status: AC
  Filled 2015-05-09: qty 1

## 2015-05-09 MED ORDER — SODIUM CHLORIDE 0.9 % IV SOLN: 250.0000 mL | INTRAVENOUS | Status: DC

## 2015-05-09 MED ORDER — LIDOCAINE HCL (CARDIAC) 20 MG/ML IV SOLN
INTRAVENOUS | Status: DC | PRN
  Administered 2015-05-09: 100 mg via INTRAVENOUS

## 2015-05-09 MED ORDER — LACTATED RINGERS IV SOLN
INTRAVENOUS | Status: DC | PRN
  Administered 2015-05-09 (×3): via INTRAVENOUS

## 2015-05-09 MED ORDER — MENTHOL 3 MG MT LOZG: 1.0000 | LOZENGE | OROMUCOSAL | Status: DC | PRN

## 2015-05-09 MED ORDER — SODIUM CHLORIDE 0.9 % IJ SOLN
3.0000 mL | Freq: Two times a day (BID) | INTRAMUSCULAR | Status: DC
  Administered 2015-05-09 (×2): 3 mL via INTRAVENOUS

## 2015-05-09 MED ORDER — SODIUM CHLORIDE 0.9 % IJ SOLN: 3.0000 mL | INTRAMUSCULAR | Status: DC | PRN

## 2015-05-09 MED ORDER — DEXAMETHASONE 4 MG PO TABS
4.0000 mg | ORAL_TABLET | Freq: Four times a day (QID) | ORAL | Status: DC
  Administered 2015-05-09 – 2015-05-10 (×4): 4 mg via ORAL
  Filled 2015-05-09 (×4): qty 1

## 2015-05-09 MED ORDER — PHENOL 1.4 % MT LIQD: 1.0000 | OROMUCOSAL | Status: DC | PRN

## 2015-05-09 MED ORDER — MIDAZOLAM HCL 5 MG/5ML IJ SOLN
INTRAMUSCULAR | Status: DC | PRN
  Administered 2015-05-09: 2 mg via INTRAVENOUS

## 2015-05-09 MED ORDER — PROPOFOL 10 MG/ML IV BOLUS
INTRAVENOUS | Status: AC
  Filled 2015-05-09: qty 20

## 2015-05-09 MED ORDER — THROMBIN 5000 UNITS EX SOLR
CUTANEOUS | Status: DC | PRN
  Administered 2015-05-09 (×2): 5000 [IU] via TOPICAL

## 2015-05-09 MED ORDER — ACETAMINOPHEN 650 MG RE SUPP: 650.0000 mg | RECTAL | Status: DC | PRN

## 2015-05-09 MED ORDER — CEFAZOLIN SODIUM-DEXTROSE 2-3 GM-% IV SOLR
INTRAVENOUS | Status: AC
  Administered 2015-05-09: 2 g via INTRAVENOUS
  Filled 2015-05-09: qty 50

## 2015-05-09 MED ORDER — ROCURONIUM BROMIDE 50 MG/5ML IV SOLN
INTRAVENOUS | Status: AC
  Filled 2015-05-09: qty 1

## 2015-05-09 MED ORDER — 0.9 % SODIUM CHLORIDE (POUR BTL) OPTIME
TOPICAL | Status: DC | PRN
  Administered 2015-05-09: 1000 mL

## 2015-05-09 MED ORDER — HEMOSTATIC AGENTS (NO CHARGE) OPTIME
TOPICAL | Status: DC | PRN
  Administered 2015-05-09: 1 via TOPICAL

## 2015-05-09 MED ORDER — CEFAZOLIN SODIUM-DEXTROSE 2-3 GM-% IV SOLR: 2.0000 g | INTRAVENOUS | Status: DC

## 2015-05-09 MED ORDER — DEXAMETHASONE SODIUM PHOSPHATE 4 MG/ML IJ SOLN: 4.0000 mg | Freq: Four times a day (QID) | INTRAMUSCULAR | Status: DC

## 2015-05-09 MED ORDER — DEXAMETHASONE SODIUM PHOSPHATE 10 MG/ML IJ SOLN
10.0000 mg | INTRAMUSCULAR | Status: AC
  Administered 2015-05-09: 10 mg via INTRAVENOUS

## 2015-05-09 MED ORDER — NEOSTIGMINE METHYLSULFATE 10 MG/10ML IV SOLN
INTRAVENOUS | Status: DC | PRN
  Administered 2015-05-09: 3 mg via INTRAVENOUS

## 2015-05-09 MED ORDER — SODIUM CHLORIDE 0.9 % IJ SOLN
INTRAMUSCULAR | Status: AC
  Filled 2015-05-09: qty 10

## 2015-05-09 MED ORDER — PROPOFOL 10 MG/ML IV BOLUS
INTRAVENOUS | Status: DC | PRN
  Administered 2015-05-09: 150 mg via INTRAVENOUS

## 2015-05-09 MED ORDER — PHENYLEPHRINE HCL 10 MG/ML IJ SOLN
INTRAMUSCULAR | Status: DC | PRN
  Administered 2015-05-09: 80 ug via INTRAVENOUS
  Administered 2015-05-09: 120 ug via INTRAVENOUS
  Administered 2015-05-09 (×4): 80 ug via INTRAVENOUS

## 2015-05-09 MED ORDER — EPHEDRINE SULFATE 50 MG/ML IJ SOLN
INTRAMUSCULAR | Status: AC
  Filled 2015-05-09: qty 1

## 2015-05-09 MED ORDER — CEFAZOLIN SODIUM 1-5 GM-% IV SOLN
1.0000 g | Freq: Three times a day (TID) | INTRAVENOUS | Status: AC
  Administered 2015-05-09 – 2015-05-10 (×2): 1 g via INTRAVENOUS
  Filled 2015-05-09 (×2): qty 50

## 2015-05-09 MED ORDER — ONDANSETRON HCL 4 MG/2ML IJ SOLN
INTRAMUSCULAR | Status: AC
  Administered 2015-05-09: 4 mg via INTRAVENOUS
  Filled 2015-05-09: qty 2

## 2015-05-09 MED ORDER — MORPHINE SULFATE 2 MG/ML IJ SOLN
1.0000 mg | INTRAMUSCULAR | Status: DC | PRN
  Administered 2015-05-09 – 2015-05-10 (×4): 2 mg via INTRAVENOUS
  Filled 2015-05-09 (×4): qty 1

## 2015-05-09 MED ORDER — DEXTROSE 5 % IV SOLN
500.0000 mg | Freq: Four times a day (QID) | INTRAVENOUS | Status: DC | PRN
  Administered 2015-05-09: 500 mg via INTRAVENOUS
  Filled 2015-05-09 (×3): qty 5

## 2015-05-09 MED ORDER — ONDANSETRON HCL 4 MG/2ML IJ SOLN
INTRAMUSCULAR | Status: DC | PRN
  Administered 2015-05-09: 4 mg via INTRAVENOUS

## 2015-05-09 MED ORDER — STERILE WATER FOR INJECTION IJ SOLN
INTRAMUSCULAR | Status: AC
  Filled 2015-05-09: qty 10

## 2015-05-09 MED ORDER — OXYCODONE-ACETAMINOPHEN 5-325 MG PO TABS
1.0000 | ORAL_TABLET | ORAL | Status: DC | PRN
  Administered 2015-05-09 (×3): 2 via ORAL
  Administered 2015-05-10 (×2): 1 via ORAL
  Administered 2015-05-10: 2 via ORAL
  Filled 2015-05-09: qty 2
  Filled 2015-05-09 (×2): qty 1
  Filled 2015-05-09 (×2): qty 2

## 2015-05-09 MED ORDER — OXYCODONE-ACETAMINOPHEN 5-325 MG PO TABS
ORAL_TABLET | ORAL | Status: AC
  Filled 2015-05-09: qty 2

## 2015-05-09 MED ORDER — CHLORHEXIDINE GLUCONATE 4 % EX LIQD: 60.0000 mL | Freq: Once | CUTANEOUS | Status: DC

## 2015-05-09 MED ORDER — MIDAZOLAM HCL 2 MG/2ML IJ SOLN
INTRAMUSCULAR | Status: AC
  Filled 2015-05-09: qty 4

## 2015-05-09 MED ORDER — TAMSULOSIN HCL 0.4 MG PO CAPS
0.4000 mg | ORAL_CAPSULE | Freq: Every day | ORAL | Status: DC
  Filled 2015-05-09: qty 1

## 2015-05-09 MED ORDER — POTASSIUM CHLORIDE IN NACL 20-0.9 MEQ/L-% IV SOLN
INTRAVENOUS | Status: DC
  Administered 2015-05-09: 13:00:00 via INTRAVENOUS
  Filled 2015-05-09: qty 1000

## 2015-05-09 MED ORDER — ONDANSETRON HCL 4 MG/2ML IJ SOLN: 4.0000 mg | INTRAMUSCULAR | Status: DC | PRN

## 2015-05-09 MED ORDER — ROCURONIUM BROMIDE 100 MG/10ML IV SOLN
INTRAVENOUS | Status: DC | PRN
  Administered 2015-05-09: 50 mg via INTRAVENOUS

## 2015-05-09 MED ORDER — VECURONIUM BROMIDE 10 MG IV SOLR
INTRAVENOUS | Status: AC
  Filled 2015-05-09: qty 10

## 2015-05-09 MED ORDER — ACETAMINOPHEN 325 MG PO TABS: 650.0000 mg | ORAL_TABLET | ORAL | Status: DC | PRN

## 2015-05-09 MED ORDER — ENOXAPARIN SODIUM 40 MG/0.4ML ~~LOC~~ SOLN: 40.0000 mg | SUBCUTANEOUS | Status: DC

## 2015-05-09 MED ORDER — LIDOCAINE HCL (CARDIAC) 20 MG/ML IV SOLN
INTRAVENOUS | Status: AC
  Filled 2015-05-09: qty 5

## 2015-05-09 MED ORDER — VECURONIUM BROMIDE 10 MG IV SOLR
INTRAVENOUS | Status: DC | PRN
  Administered 2015-05-09 (×2): 2 mg via INTRAVENOUS

## 2015-05-09 MED ORDER — METHOCARBAMOL 500 MG PO TABS
500.0000 mg | ORAL_TABLET | Freq: Four times a day (QID) | ORAL | Status: DC | PRN
  Filled 2015-05-09: qty 1

## 2015-05-09 MED ORDER — BACITRACIN 50000 UNITS IM SOLR
INTRAMUSCULAR | Status: DC | PRN
  Administered 2015-05-09: 09:00:00

## 2015-05-09 SURGICAL SUPPLY — 109 items
APPLIER CLIP 11 MED OPEN (CLIP)
BAG DECANTER FOR FLEXI CONT (MISCELLANEOUS) ×4 IMPLANT
BENZOIN TINCTURE PRP APPL 2/3 (GAUZE/BANDAGES/DRESSINGS) IMPLANT
BUR EGG ELITE 5.0 (BURR) IMPLANT
BUR EGG ELITE 5.0MM (BURR)
BUR MATCHSTICK NEURO 3.0 LAGG (BURR) IMPLANT
CAGE COROENT XLR 16X38X28-8 (Cage) ×4 IMPLANT
CANISTER SUCT 3000ML PPV (MISCELLANEOUS) ×4 IMPLANT
CATH COUDE FOLEY 2W 5CC 16FR (CATHETERS) ×4 IMPLANT
CLIP APPLIE 11 MED OPEN (CLIP) IMPLANT
CLOSURE WOUND 1/2 X4 (GAUZE/BANDAGES/DRESSINGS)
COVER BACK TABLE 24X17X13 BIG (DRAPES) IMPLANT
DERMABOND ADVANCED (GAUZE/BANDAGES/DRESSINGS) ×2
DERMABOND ADVANCED .7 DNX12 (GAUZE/BANDAGES/DRESSINGS) ×2 IMPLANT
DRAPE C-ARM 42X72 X-RAY (DRAPES) ×12 IMPLANT
DRAPE INCISE IOBAN 66X45 STRL (DRAPES) IMPLANT
DRAPE LAPAROTOMY 100X72X124 (DRAPES) ×4 IMPLANT
DRAPE POUCH INSTRU U-SHP 10X18 (DRAPES) ×4 IMPLANT
DURAPREP 26ML APPLICATOR (WOUND CARE) ×4 IMPLANT
ELECT BLADE 4.0 EZ CLEAN MEGAD (MISCELLANEOUS) ×4
ELECT REM PT RETURN 9FT ADLT (ELECTROSURGICAL) ×4
ELECTRODE BLDE 4.0 EZ CLN MEGD (MISCELLANEOUS) ×2 IMPLANT
ELECTRODE REM PT RTRN 9FT ADLT (ELECTROSURGICAL) ×2 IMPLANT
GAUZE SPONGE 4X4 12PLY STRL (GAUZE/BANDAGES/DRESSINGS) ×4 IMPLANT
GAUZE SPONGE 4X4 16PLY XRAY LF (GAUZE/BANDAGES/DRESSINGS) IMPLANT
GLOVE BIO SURGEON STRL SZ 6.5 (GLOVE) IMPLANT
GLOVE BIO SURGEON STRL SZ7 (GLOVE) ×8 IMPLANT
GLOVE BIO SURGEON STRL SZ8 (GLOVE) ×8 IMPLANT
GLOVE BIO SURGEON STRL SZ8.5 (GLOVE) IMPLANT
GLOVE BIO SURGEONS STRL SZ 6.5 (GLOVE)
GLOVE BIOGEL PI IND STRL 7.0 (GLOVE) ×6 IMPLANT
GLOVE BIOGEL PI INDICATOR 7.0 (GLOVE) ×6
GLOVE ECLIPSE 6.5 STRL STRAW (GLOVE) IMPLANT
GLOVE ECLIPSE 7.0 STRL STRAW (GLOVE) IMPLANT
GLOVE ECLIPSE 7.5 STRL STRAW (GLOVE) IMPLANT
GLOVE INDICATOR 6.5 STRL GRN (GLOVE) IMPLANT
GLOVE INDICATOR 7.0 STRL GRN (GLOVE) IMPLANT
GLOVE INDICATOR 8.0 STRL GRN (GLOVE) IMPLANT
GLOVE OPTIFIT SS 6.5 STRL BRWN (GLOVE) IMPLANT
GLOVE OPTIFIT SS 7.0 STRL BRWN (GLOVE)
GLOVE OPTIFIT SS 7.5 STRL LX (GLOVE) IMPLANT
GLOVE OPTIFIT SS 8.0 STRL (GLOVE) IMPLANT
GLOVE OPTIFIT SS 8.5 STRL (GLOVE) IMPLANT
GLOVE SS BIOGEL STRL SZ 7.5 (GLOVE) ×4 IMPLANT
GLOVE SS N UNI LF 7.5 STRL (GLOVE) IMPLANT
GLOVE SUPERSENSE BIOGEL SZ 7.5 (GLOVE) ×4
GLOVE SURG SS PI 6.5 STRL IVOR (GLOVE) IMPLANT
GLOVE SURG SS PI 7.0 STRL IVOR (GLOVE) ×12 IMPLANT
GLOVE SURG SS PI 7.5 STRL IVOR (GLOVE) IMPLANT
GLOVE SURG SS PI 8.0 STRL IVOR (GLOVE) IMPLANT
GLOVE SURG SS PI 8.5 STRL IVOR (GLOVE)
GLOVE SURG SS PI 8.5 STRL STRW (GLOVE) IMPLANT
GOWN STRL NON-REIN LRG LVL3 (GOWN DISPOSABLE) IMPLANT
GOWN STRL REUS W/ TWL LRG LVL3 (GOWN DISPOSABLE) IMPLANT
GOWN STRL REUS W/ TWL XL LVL3 (GOWN DISPOSABLE) ×4 IMPLANT
GOWN STRL REUS W/TWL 2XL LVL3 (GOWN DISPOSABLE) ×4 IMPLANT
GOWN STRL REUS W/TWL LRG LVL3 (GOWN DISPOSABLE)
GOWN STRL REUS W/TWL XL LVL3 (GOWN DISPOSABLE) ×4
HEMOSTAT POWDER KIT SURGIFOAM (HEMOSTASIS) ×4 IMPLANT
INSERT FOGARTY 61MM (MISCELLANEOUS) IMPLANT
INSERT FOGARTY SM (MISCELLANEOUS) IMPLANT
KIT BASIN OR (CUSTOM PROCEDURE TRAY) ×4 IMPLANT
KIT INFUSE X SMALL 1.4CC (Orthopedic Implant) ×4 IMPLANT
KIT ROOM TURNOVER OR (KITS) ×8 IMPLANT
LIQUID BAND (GAUZE/BANDAGES/DRESSINGS) IMPLANT
LOOP VESSEL MAXI BLUE (MISCELLANEOUS) IMPLANT
LOOP VESSEL MINI RED (MISCELLANEOUS) IMPLANT
NEEDLE SPNL 18GX3.5 QUINCKE PK (NEEDLE) ×4 IMPLANT
NS IRRIG 1000ML POUR BTL (IV SOLUTION) ×4 IMPLANT
PACK LAMINECTOMY NEURO (CUSTOM PROCEDURE TRAY) ×4 IMPLANT
PAD ARMBOARD 7.5X6 YLW CONV (MISCELLANEOUS) ×12 IMPLANT
PIN FIX PLATE BRIGADE 12MM THD (Pin) ×8 IMPLANT
PLATE BRIGADE LORDOTIC 38MM (Plate) ×4 IMPLANT
PUTTY BONE DBX 5CC MIX (Putty) ×4 IMPLANT
PUTTY STIMUBLAST 2.5CC (Putty) ×4 IMPLANT
SCREW PLATE BRIGADE 5.5X30.0MM (Screw) ×16 IMPLANT
SPONGE INTESTINAL PEANUT (DISPOSABLE) ×12 IMPLANT
SPONGE LAP 18X18 X RAY DECT (DISPOSABLE) ×4 IMPLANT
SPONGE LAP 4X18 X RAY DECT (DISPOSABLE) IMPLANT
STAPLER VISISTAT 35W (STAPLE) IMPLANT
STRIP CLOSURE SKIN 1/2X4 (GAUZE/BANDAGES/DRESSINGS) IMPLANT
SUT MNCRL AB 4-0 PS2 18 (SUTURE) IMPLANT
SUT PROLENE 4 0 RB 1 (SUTURE)
SUT PROLENE 4-0 RB1 .5 CRCL 36 (SUTURE) IMPLANT
SUT PROLENE 5 0 CC1 (SUTURE) IMPLANT
SUT PROLENE 6 0 C 1 30 (SUTURE) IMPLANT
SUT PROLENE 6 0 CC (SUTURE) IMPLANT
SUT SILK 0 TIES 10X30 (SUTURE) IMPLANT
SUT SILK 2 0 TIES 10X30 (SUTURE) ×4 IMPLANT
SUT SILK 2 0SH CR/8 30 (SUTURE) IMPLANT
SUT SILK 3 0 TIES 10X30 (SUTURE) IMPLANT
SUT SILK 3 0 TIES 17X18 (SUTURE)
SUT SILK 3 0SH CR/8 30 (SUTURE) IMPLANT
SUT SILK 3-0 18XBRD TIE BLK (SUTURE) IMPLANT
SUT VIC AB 0 CT1 18XCR BRD8 (SUTURE) ×2 IMPLANT
SUT VIC AB 0 CT1 27 (SUTURE) ×6
SUT VIC AB 0 CT1 27XBRD ANBCTR (SUTURE) ×6 IMPLANT
SUT VIC AB 0 CT1 8-18 (SUTURE) ×2
SUT VIC AB 2-0 CP2 18 (SUTURE) ×4 IMPLANT
SUT VIC AB 2-0 CT1 36 (SUTURE) IMPLANT
SUT VIC AB 3-0 SH 27 (SUTURE)
SUT VIC AB 3-0 SH 27X BRD (SUTURE) IMPLANT
SUT VIC AB 3-0 SH 8-18 (SUTURE) ×4 IMPLANT
SUT VICRYL 4-0 PS2 18IN ABS (SUTURE) ×4 IMPLANT
SYR 20ML ECCENTRIC (SYRINGE) ×4 IMPLANT
TOWEL OR 17X24 6PK STRL BLUE (TOWEL DISPOSABLE) ×16 IMPLANT
TOWEL OR 17X26 10 PK STRL BLUE (TOWEL DISPOSABLE) ×8 IMPLANT
TRAY FOLEY W/METER SILVER 16FR (SET/KITS/TRAYS/PACK) ×4 IMPLANT
WATER STERILE IRR 1000ML POUR (IV SOLUTION) ×4 IMPLANT

## 2015-05-09 NOTE — Transfer of Care (Signed)
Immediate Anesthesia Transfer of Care Note  Patient: Don Christian  Procedure(s) Performed: Procedure(s): LUMBAR FOUR-FIVE ANTERIOR LUMBAR FUSION WITH ABDOMINAL EXPOSURE (N/A) ABDOMINAL EXPOSURE (N/A)  Patient Location: PACU  Anesthesia Type:General  Level of Consciousness: awake, alert , oriented and patient cooperative  Airway & Oxygen Therapy: Patient Spontanous Breathing and Patient connected to nasal cannula oxygen  Post-op Assessment: Report given to RN, Post -op Vital signs reviewed and stable and Patient moving all extremities  Post vital signs: Reviewed and stable  Last Vitals:  Filed Vitals:   05/09/15 0612  BP: 110/67  Pulse: 56  Temp: 36.1 C  Resp: 18    Complications: No apparent anesthesia complications

## 2015-05-09 NOTE — Anesthesia Postprocedure Evaluation (Signed)
  Anesthesia Post-op Note  Patient: Don Christian  Procedure(s) Performed: Procedure(s): LUMBAR FOUR-FIVE ANTERIOR LUMBAR FUSION WITH ABDOMINAL EXPOSURE (N/A) ABDOMINAL EXPOSURE (N/A)  Patient Location: PACU  Anesthesia Type:General  Level of Consciousness: awake, alert  and oriented  Airway and Oxygen Therapy: Patient Spontanous Breathing  Post-op Pain: mild  Post-op Assessment: Post-op Vital signs reviewed, Patient's Cardiovascular Status Stable, Respiratory Function Stable, Patent Airway and No signs of Nausea or vomiting   LLE Sensation: Full sensation, No numbness, No tingling   RLE Sensation: Full sensation, No numbness, No tingling      Post-op Vital Signs: Reviewed and stable  Last Vitals:  Filed Vitals:   05/09/15 1130  BP: 120/72  Pulse: 80  Temp:   Resp: 12    Complications: No apparent anesthesia complications

## 2015-05-09 NOTE — Progress Notes (Signed)
Pt arrived to 4N07 via stretcher from PACU.  Pt welcomed and settled into the room.  Pt incision clean dry and intact.  VSS.  Pt A/O x4, conversant.  Will continue to monitor. Cori Razor, RN

## 2015-05-09 NOTE — Anesthesia Preprocedure Evaluation (Addendum)
Anesthesia Evaluation  Patient identified by MRN, date of birth, ID band Patient awake    Reviewed: Allergy & Precautions, NPO status , Patient's Chart, lab work & pertinent test results  History of Anesthesia Complications Negative for: history of anesthetic complications  Airway Mallampati: II  TM Distance: >3 FB Neck ROM: Full    Dental  (+) Dental Advisory Given, Upper Dentures, Lower Dentures   Pulmonary neg pulmonary ROS, COPDformer smoker,  Quit smoking 1 year ago; diagnosed with COPD "a long time ago" but does not use inhalers. Denies SOB, DOE. breath sounds clear to auscultation  Pulmonary exam normal       Cardiovascular Exercise Tolerance: Good negative cardio ROS Normal cardiovascular examRhythm:Regular Rate:Normal     Neuro/Psych PSYCHIATRIC DISORDERS 64 pack year smoking. Quit 1 year agonegative neurological ROS     GI/Hepatic negative GI ROS, Neg liver ROS,   Endo/Other  negative endocrine ROS  Renal/GU negative Renal ROS   BPH on Flomax    Musculoskeletal  (+) Arthritis -, Osteoarthritis,  DDD   Abdominal   Peds  Hematology negative hematology ROS (+)   Anesthesia Other Findings   Reproductive/Obstetrics                            Anesthesia Physical Anesthesia Plan  ASA: II  Anesthesia Plan: General   Post-op Pain Management:    Induction: Intravenous  Airway Management Planned: Oral ETT  Additional Equipment:   Intra-op Plan:   Post-operative Plan: Extubation in OR  Informed Consent: I have reviewed the patients History and Physical, chart, labs and discussed the procedure including the risks, benefits and alternatives for the proposed anesthesia with the patient or authorized representative who has indicated his/her understanding and acceptance.   Dental advisory given  Plan Discussed with: CRNA and Anesthesiologist  Anesthesia Plan Comments:  (Risks/benefits of general anesthesia discussed with patient including risk of damage to teeth, lips, gum, and tongue, nausea/vomiting, allergic reactions to medications, and the possibility of heart attack, stroke and death.  All patient questions answered.  Patient wishes to proceed.)        Anesthesia Quick Evaluation

## 2015-05-09 NOTE — H&P (Signed)
Subjective: Patient is a 59 y.o. male admitted for back pain. Onset of symptoms was yrs ago ago, progressive since that time.  The pain is rated severe, and is located at the back and radiates to legs. The pain is described as aching and occurs all day. The symptoms have been progressive. Symptoms are exacerbated by activity. MRI or CT showed DDD L4-5   Past Medical History  Diagnosis Date  . Colon polyps   . Sleeping difficulties   . Prostate disorder   . DDD (degenerative disc disease), lumbar 09/16/2014  . BPH (benign prostatic hyperplasia) 09/16/2014  . Tobacco use disorder 09/16/2014  . Insomnia 09/16/2014  . Chronic back pain 09/16/2014  . COPD (chronic obstructive pulmonary disease)     mild  . Inguinal hernia 2004  . Full dentures   . Cataract     left eye    Past Surgical History  Procedure Laterality Date  . Hernia repair    . Tooth extraction    . Colonoscopy w/ biopsies and polypectomy    . Ganglion cyst excision      left leg    Prior to Admission medications   Medication Sig Start Date End Date Taking? Authorizing Provider  ibuprofen (ADVIL,MOTRIN) 800 MG tablet Take 800 mg by mouth 2 (two) times daily as needed (pain).    Yes Historical Provider, MD  oxyCODONE-acetaminophen (PERCOCET/ROXICET) 5-325 MG per tablet Take 1 tablet by mouth every 6 (six) hours as needed for moderate pain.  12/10/14  Yes Historical Provider, MD  tamsulosin (FLOMAX) 0.4 MG CAPS capsule TAKE 1 CAPSULE BY MOUTH EVERY DAY 04/08/15  Yes Lucretia Kern, DO  traMADol (ULTRAM) 50 MG tablet Take 50 mg by mouth every 6 (six) hours as needed for severe pain.   Yes Historical Provider, MD   No Known Allergies  History  Substance Use Topics  . Smoking status: Former Smoker -- 1.75 packs/day for 37 years    Types: Cigars    Quit date: 05/23/2014  . Smokeless tobacco: Not on file     Comment: Quit cigarettes 2008, has smoked 3 cigars daily since 2013  . Alcohol Use: 0.0 oz/week    0 Standard drinks or  equivalent per week     Comment: occassionally on social occassions    Family History  Problem Relation Age of Onset  . Cancer Mother   . Heart disease Mother   . Cancer Father   . Cancer Sister      Review of Systems  Positive ROS: neg  All other systems have been reviewed and were otherwise negative with the exception of those mentioned in the HPI and as above.  Objective: Vital signs in last 24 hours: Temp:  [97 F (36.1 C)] 97 F (36.1 C) (08/05 0612) Pulse Rate:  [56] 56 (08/05 0612) Resp:  [18] 18 (08/05 0612) BP: (110)/(67) 110/67 mmHg (08/05 0612) SpO2:  [100 %] 100 % (08/05 0612) Weight:  [156 lb (70.761 kg)-156 lb 4.8 oz (70.897 kg)] 156 lb (70.761 kg) (08/05 0612)  General Appearance: Alert, cooperative, no distress, appears stated age Head: Normocephalic, without obvious abnormality, atraumatic Eyes: PERRL, conjunctiva/corneas clear, EOM's intact    Neck: Supple, symmetrical, trachea midline Back: Symmetric, no curvature, ROM normal, no CVA tenderness Lungs:  respirations unlabored Heart: Regular rate and rhythm Abdomen: Soft, non-tender Extremities: Extremities normal, atraumatic, no cyanosis or edema Pulses: 2+ and symmetric all extremities Skin: Skin color, texture, turgor normal, no rashes or lesions  NEUROLOGIC:  Mental status: Alert and oriented x4,  no aphasia, good attention span, fund of knowledge, and memory Motor Exam - grossly normal Sensory Exam - grossly normal Reflexes: 1+ Coordination - grossly normal Gait - grossly normal Balance - grossly normal Cranial Nerves: I: smell Not tested  II: visual acuity  OS: nl    OD: nl  II: visual fields Full to confrontation  II: pupils Equal, round, reactive to light  III,VII: ptosis None  III,IV,VI: extraocular muscles  Full ROM  V: mastication Normal  V: facial light touch sensation  Normal  V,VII: corneal reflex  Present  VII: facial muscle function - upper  Normal  VII: facial muscle  function - lower Normal  VIII: hearing Not tested  IX: soft palate elevation  Normal  IX,X: gag reflex Present  XI: trapezius strength  5/5  XI: sternocleidomastoid strength 5/5  XI: neck flexion strength  5/5  XII: tongue strength  Normal    Data Review Lab Results  Component Value Date   WBC 9.7 05/07/2015   HGB 15.0 05/07/2015   HCT 44.7 05/07/2015   MCV 91.4 05/07/2015   PLT 233 05/07/2015   Lab Results  Component Value Date   NA 138 05/07/2015   K 4.1 05/07/2015   CL 102 05/07/2015   CO2 29 05/07/2015   BUN 8 05/07/2015   CREATININE 0.90 05/07/2015   GLUCOSE 109* 05/07/2015   Lab Results  Component Value Date   INR 1.04 05/07/2015    Assessment/Plan: Patient admitted for ALIF. Patient has failed a reasonable attempt at conservative therapy.  I explained the condition and procedure to the patient and answered any questions.  Patient wishes to proceed with procedure as planned. Understands risks/ benefits and typical outcomes of procedure.   JONES,DAVID S 05/09/2015 6:57 AM

## 2015-05-09 NOTE — Op Note (Signed)
05/09/2015  10:39 AM  PATIENT:  Marcelle Smiling  59 y.o. male  PRE-OPERATIVE DIAGNOSIS:  Degenerative disc disease L4-5 with back and leg pain  POST-OPERATIVE DIAGNOSIS:  Same  PROCEDURE:  1. Anterior lumbar interbody fusion L4-5 utilizing a 16 mm peek interbody cage packed with morcellized allograft followed by anterior lumbar plating with a 38 mm nuvasive brigade plate  SURGEON:  Sherley Bounds, MD  Co-surgeon: Dr. early  ASSISTANTS: None  ANESTHESIA:   General  EBL: Less than 100 ml  Total I/O In: 2300 [I.V.:2300] Out: 450 [Urine:450]  BLOOD ADMINISTERED:none  DRAINS: None   SPECIMEN:  No Specimen  INDICATION FOR PROCEDURE: This patient presented with a long history of chronic debilitating back pain. MRI showed degenerative disc disease at L4-5. Provocative discography confirmed concordant pain at L4-5 with normal controls at L3-4 and L5-S1. I recommended anterior lumbar interbody fusion at L4-5. Patient understood the risks, benefits, and alternatives and potential outcomes and wished to proceed.  PROCEDURE DETAILS: The patient was taken to the operating room and after induction of adequate general endotracheal anesthesia he was placed in the supine position on the operating table. His abdomen was cleaned and then prepped DuraPrep and draped in usual sterile fashion. The exposure was performed by Dr. early of vascular surgery and that will be dictated in a separate operative report. Retractor is then placed with placed a needle in the disc space to mark our midline. The disc was then incised and released from the endplates with a Cobb and then removed. I then used curettes to prepare the endplates for arthrodesis and remove any remaining disc down to the level of the posterior longitudinal ligament. The ligament was opened and removed with a 3 mm Kerrison punch. The dura was visible. We then started using trials and started with a 10 mm trial. The 16 mm trial fit the best. Therefore  used a 16 mm x 8 peek interbody cage packed with morcellized allograft and tapped this into position at L4-5. We then used a 13 m brigade plate and locked this into position with locking pins and then placed 30 mm screws and locked these into position also. We then checked our final construct with AP and lateral fluoroscopy. The retractors were removed and there was no bleeding. I therefore closed the fascia with running 0 Vicryls. The subcutaneous tissues were closed with 20 Vicodin the subcuticular tissue was closed with 30 Vicodin. Final x-ray showed no retained instrument or sponge. In was closed with Dermabond. The patient was awakened from general anesthesia and transferred to recovery in stable condition. At the index of the procedure all sponge needle and instrument counts were correct.  PLAN OF CARE: Admit to inpatient   PATIENT DISPOSITION:  PACU - hemodynamically stable.   Delay start of Pharmacological VTE agent (>24hrs) due to surgical blood loss or risk of bleeding:  yes

## 2015-05-09 NOTE — Progress Notes (Signed)
Lovenox for VTE prophylaxis to start tomorrow per consult order entered for pharmacy to dose.  Patient with good renal function- CrCl ~80-74mL/min.  Baseline hgb 15 and olts 233 on 8/3. No overt bleeding noted intra-op.  Plan: -start Lovenox 40mg  subQ q24h beginning 8/6 at 1100- this will be 24h after AET -pharmacy to sign off as no additional monitoring required   Zoiey Christy D. Britteny Fiebelkorn, PharmD, BCPS Clinical Pharmacist Pager: [phone] 05/09/2015 1:14 PM

## 2015-05-09 NOTE — Care Management Note (Signed)
Case Management Note  Patient Details  Name: Don Christian MRN: [id number] Date of Birth: 06/29/1956  Subjective/Objective:                    Action/Plan:  Patient was admitted with an ALIF. Awaiting PT/OT evals for disposition. CM will continue to follow. Expected Discharge Date:                  Expected Discharge Plan:   (awaiting PT/OT evals for discharge disposition)  In-House Referral:     Discharge planning Services     Post Acute Care Choice:    Choice offered to:     DME Arranged:    DME Agency:     HH Arranged:    HH Agency:     Status of Service:  In process, will continue to follow  Medicare Important Message Given:    Date Medicare IM Given:    Medicare IM give by:    Date Additional Medicare IM Given:    Additional Medicare Important Message give by:     If discussed at [place] of Stay Meetings, dates discussed:    Additional Comments:  Rolm Baptise, RN 05/09/2015, 2:27 PM

## 2015-05-09 NOTE — Op Note (Signed)
    OPERATIVE REPORT  DATE OF SURGERY: 05/09/2015  PATIENT: Don Christian, 59 y.o. male MRN: [id number]  DOB: [birth year]  PRE-OPERATIVE DIAGNOSIS: L4-5 degenerative disc disease  POST-OPERATIVE DIAGNOSIS:  Same  PROCEDURE: Anterior exposure for L4-5 disc surgery  SURGEON:  Curt Jews, M.D.  Co-surgeon for the exposure Dr. Danella Sensing  ANESTHESIA:  Enteral  EBL: Minimal ml  Total I/O In: 2300 [I.V.:2300] Out: 450 [Urine:450]  BLOOD ADMINISTERED: None  DRAINS: None    COUNTS CORRECT:  YES  PLAN OF CARE: PACU   PATIENT DISPOSITION:  PACU - hemodynamically stable  PROCEDURE DETAILS: The patient was taken to the operative placed supine position where the area of the abdomen was prepped and draped in usual sterile fashion. Lateral C-arm projection was used to show the level of L4-5 disc on the anterior abdominal wall. Patient was prepped and draped in usual sterile fashion. A transverse incision was made from just below the umbilicus laterally at the level of the L4-5 disc. The fat was divided with electrocautery in line with the skin incision was mobilized off the anterior rectus sheath. Anterior rectus sheath was opened with left cautery in line with the skin incision. The rectus muscle was mobilized circumferentially proximally and distally for exposure. The left lower quadrant with peritoneal space was entered bluntly below the level of the semilunar line. Blunt dissection was used to mobilize intraperitoneal contents to the right. The posterior rectus sheath was opened laterally to allow exposure. Blunt dissection was continued above the level of the psoas muscle. The left common iliac vein was identified. It had 2 large branches of iliolumbar veins. These were ligated with 2-0 silk ties and divided. Blunt dissection was continued over the L4-5 disc to give adequate anterior exposure. The patient had moderate atherosclerotic change is noted on his present. CT scan with  desiccation of his aorta. Care was taken to gently mobilize the artery to the right. The reverse lip 100 blades were positioned the right and left of the L4-5 disc and malleable retractors were used for anterior inferior exposure. C-arm was brought back onto the field and the needle was used to confirm that this was the L4-5 disc. The remainder the procedure will be dictated as a separate note by Dr. Ronnald Ramp  At the completion patient did have good flow of the area of the pulse oximeter in the left leg. Had had the easy palpable popliteal pulses bilaterally. Preop and intraoperative had a diminished dorsalis pedis pulses bilaterally. Patient was transferred to the recovery room in stable condition   Curt Jews, M.D. 05/09/2015 10:44 AM

## 2015-05-09 NOTE — Anesthesia Procedure Notes (Signed)
Procedure Name: Intubation Date/Time: 05/09/2015 7:43 AM Performed by: Julian Reil Pre-anesthesia Checklist: Patient identified, Emergency Drugs available, Suction available and Patient being monitored Patient Re-evaluated:Patient Re-evaluated prior to inductionOxygen Delivery Method: Circle system utilized Preoxygenation: Pre-oxygenation with 100% oxygen Intubation Type: IV induction Ventilation: Mask ventilation without difficulty and Oral airway inserted - appropriate to patient size Laryngoscope Size: Mac and 4 Grade View: Grade I Tube type: Oral Tube size: 7.5 mm Number of attempts: 1 Airway Equipment and Method: Stylet Placement Confirmation: ETT inserted through vocal cords under direct vision,  positive ETCO2 and breath sounds checked- equal and bilateral Secured at: 22 cm Tube secured with: Tape Dental Injury: Teeth and Oropharynx as per pre-operative assessment

## 2015-05-10 MED ORDER — PANTOPRAZOLE SODIUM 40 MG PO TBEC: 40.0000 mg | DELAYED_RELEASE_TABLET | Freq: Every day | ORAL | Status: DC

## 2015-05-10 MED ORDER — PANTOPRAZOLE SODIUM 40 MG IV SOLR
40.0000 mg | Freq: Every day | INTRAVENOUS | Status: DC
  Administered 2015-05-10: 40 mg via INTRAVENOUS
  Filled 2015-05-10: qty 40

## 2015-05-10 MED ORDER — OXYCODONE-ACETAMINOPHEN 5-325 MG PO TABS: 1.0000 | ORAL_TABLET | ORAL | Status: DC | PRN

## 2015-05-10 NOTE — Discharge Instructions (Signed)
No lifting no bending no twisting no driving wear the brace whenever out of bedSpinal Fusion Care After Refer to this sheet in the next few weeks. These instructions provide you with information on caring for yourself after your procedure. Your caregiver may also give you more specific instructions. Your treatment has been planned according to current medical practices, but problems sometimes occur. Call your caregiver if you have any problems or questions after your procedure. HOME CARE INSTRUCTIONS   Take whatever pain medicine has been prescribed by your caregiver. Do not take over-the-counter pain medicine unless directed otherwise by your caregiver.  Do not drive if you are taking narcotic pain medicines.  Change your bandage (dressing) if necessary or as directed by your caregiver.  Do not get your surgical cut (incision) wet. After a few days you may take quick showers (rather than baths), but keep your incision clean and dry. Covering the incision with plastic wrap while you shower should keep your incision dry. A few weeks after surgery, once your incision has healed and your caregiver says it is okay, you can take baths or go swimming.  If you have been prescribed medicine to prevent your blood from clotting, follow the directions carefully.  Check the area around your incision often. Look for redness and swelling. Also, look for anything leaking from your wound. You can use a mirror or have a family member inspect your incision if it is in a place where it is difficult for you to see.  Ask your caregiver what activities you should avoid and for how long.  Walk as much as possible.  Do not lift anything heavier than 10 pounds (4.5 kilograms) until your caregiver says it is safe.  Do not twist or bend for a few weeks. Try not to pull on things. Avoid sitting for long periods of time. Change positions at least every hour.  Ask your caregiver what kinds of exercise you should do to  make your back stronger and when you should begin doing these exercises. SEEK IMMEDIATE MEDICAL CARE IF:   Pain suddenly becomes much worse.  The incision area is red, swollen, bleeding, or leaking fluid.  Your legs or feet become increasingly painful, numb, weak, or swollen.  You have trouble controlling urination or bowel movements.  You have trouble breathing.  You have chest pain.  You have a fever. MAKE SURE YOU:  Understand these instructions.  Will watch your condition.  Will get help right away if you are not doing well or get worse. Document Released: 04/09/2005 Document Revised: 12/13/2011 Document Reviewed: 12/03/2010 [facility] Patient Information 2015 [place], [place]. This information is not intended to replace advice given to you by your health care provider. Make sure you discuss any questions you have with your health care provider.

## 2015-05-10 NOTE — Discharge Summary (Signed)
  Physician Discharge Summary  Patient ID: Jobany Montellano MRN: [id number] DOB/AGE: 11-01-1955 59 y.o.  Admit date: 05/09/2015 Discharge date: 05/10/2015  Admission Diagnoses: Degenerative disc disease spinal stenosis L5-S1  Discharge Diagnoses: Same Active Problems:   S/P lumbar spinal fusion   Discharged Condition: good  Hospital Course: Patient admitted hospital underwent an anterior lumbar interbody fusion L5-S1 postoperative patient did very well with recovered in the floor on the floor was angling and voiding spontaneously tolerating regular diet was stable for discharge home  Consults: Significant Diagnostic Studies: Treatments: ALIF L5-S1 Discharge Exam: Blood pressure 99/65, pulse 69, temperature 97.6 F (36.4 C), temperature source Oral, resp. rate 18, height 6' (1.829 m), weight 70.761 kg (156 lb), SpO2 100 %. Strength out of 5 wound clean dry and intact  Disposition: Home     Medication List    TAKE these medications        ibuprofen 800 MG tablet  Commonly known as:  ADVIL,MOTRIN  Take 800 mg by mouth 2 (two) times daily as needed (pain).     oxyCODONE-acetaminophen 5-325 MG per tablet  Commonly known as:  PERCOCET/ROXICET  Take 1 tablet by mouth every 6 (six) hours as needed for moderate pain.     oxyCODONE-acetaminophen 5-325 MG per tablet  Commonly known as:  PERCOCET/ROXICET  Take 1-2 tablets by mouth every 4 (four) hours as needed for moderate pain.     tamsulosin 0.4 MG Caps capsule  Commonly known as:  FLOMAX  TAKE 1 CAPSULE BY MOUTH EVERY DAY     traMADol 50 MG tablet  Commonly known as:  ULTRAM  Take 50 mg by mouth every 6 (six) hours as needed for severe pain.           Follow-up Information    Follow up with Eustace Moore, MD.   Specialty:  Neurosurgery   Contact information:   [address] [place] [address] [phone]       Signed: Elaina Hoops 05/10/2015, 8:15 AM

## 2015-05-10 NOTE — Progress Notes (Signed)
Pt d/c to home by car with family. Assessment stable. Prescription given. All questions answered 

## 2015-05-10 NOTE — Progress Notes (Signed)
Patient ID: Don Christian, male   DOB: May 19, 1956, 59 y.o.   MRN: [id number] Doing well no leg pain neurologically intact discharge home

## 2015-05-10 NOTE — Progress Notes (Signed)
Patient ID: Don Christian, male   DOB: 1956-05-18, 59 y.o.   MRN: [id number] Looks great. Mild soreness. Ready for discharge. Abdomen soft mild tenderness. 2+ left popliteal pulse. Again discussed his advanced atherosclerotic changes of his aortoiliac vessels without evidence of stenosis. Explain the critical importance of risk factor modification for atherosclerosis. He argues quit smoking. Understands need for blood pressure and lipid management. He will see me again on as-needed basis

## 2015-05-12 ENCOUNTER — Encounter (HOSPITAL_COMMUNITY): Payer: Self-pay | Admitting: Neurological Surgery

## 2015-05-29 DIAGNOSIS — M545 Low back pain: Secondary | ICD-10-CM | POA: Diagnosis not present

## 2015-05-29 DIAGNOSIS — R293 Abnormal posture: Secondary | ICD-10-CM | POA: Diagnosis not present

## 2015-05-29 DIAGNOSIS — M5126 Other intervertebral disc displacement, lumbar region: Secondary | ICD-10-CM | POA: Diagnosis not present

## 2015-05-29 DIAGNOSIS — M6281 Muscle weakness (generalized): Secondary | ICD-10-CM | POA: Diagnosis not present

## 2015-06-04 DIAGNOSIS — M5126 Other intervertebral disc displacement, lumbar region: Secondary | ICD-10-CM | POA: Diagnosis not present

## 2015-06-04 DIAGNOSIS — M545 Low back pain: Secondary | ICD-10-CM | POA: Diagnosis not present

## 2015-06-04 DIAGNOSIS — M6281 Muscle weakness (generalized): Secondary | ICD-10-CM | POA: Diagnosis not present

## 2015-06-04 DIAGNOSIS — R293 Abnormal posture: Secondary | ICD-10-CM | POA: Diagnosis not present

## 2015-06-10 DIAGNOSIS — M6281 Muscle weakness (generalized): Secondary | ICD-10-CM | POA: Diagnosis not present

## 2015-06-10 DIAGNOSIS — R293 Abnormal posture: Secondary | ICD-10-CM | POA: Diagnosis not present

## 2015-06-10 DIAGNOSIS — M545 Low back pain: Secondary | ICD-10-CM | POA: Diagnosis not present

## 2015-06-10 DIAGNOSIS — M5126 Other intervertebral disc displacement, lumbar region: Secondary | ICD-10-CM | POA: Diagnosis not present

## 2015-06-12 DIAGNOSIS — M545 Low back pain: Secondary | ICD-10-CM | POA: Diagnosis not present

## 2015-06-12 DIAGNOSIS — M5126 Other intervertebral disc displacement, lumbar region: Secondary | ICD-10-CM | POA: Diagnosis not present

## 2015-06-12 DIAGNOSIS — R293 Abnormal posture: Secondary | ICD-10-CM | POA: Diagnosis not present

## 2015-06-12 DIAGNOSIS — M6281 Muscle weakness (generalized): Secondary | ICD-10-CM | POA: Diagnosis not present

## 2015-06-17 DIAGNOSIS — M6281 Muscle weakness (generalized): Secondary | ICD-10-CM | POA: Diagnosis not present

## 2015-06-17 DIAGNOSIS — M5126 Other intervertebral disc displacement, lumbar region: Secondary | ICD-10-CM | POA: Diagnosis not present

## 2015-06-17 DIAGNOSIS — R293 Abnormal posture: Secondary | ICD-10-CM | POA: Diagnosis not present

## 2015-06-17 DIAGNOSIS — M545 Low back pain: Secondary | ICD-10-CM | POA: Diagnosis not present

## 2015-06-18 ENCOUNTER — Telehealth: Payer: Self-pay | Admitting: Family Medicine

## 2015-06-18 NOTE — Telephone Encounter (Signed)
Sounds good. Thanks 

## 2015-06-18 NOTE — Telephone Encounter (Signed)
Ok with me 

## 2015-06-18 NOTE — Telephone Encounter (Signed)
Appointment 9/26 pt aware

## 2015-06-18 NOTE — Telephone Encounter (Signed)
Left message asking pt to call office  °

## 2015-06-18 NOTE — Telephone Encounter (Signed)
Pt called wanting to transfer from Dr Maudie Mercury to [facility] Is this ok? [facility] is closer to his home

## 2015-06-19 DIAGNOSIS — M6281 Muscle weakness (generalized): Secondary | ICD-10-CM | POA: Diagnosis not present

## 2015-06-19 DIAGNOSIS — R293 Abnormal posture: Secondary | ICD-10-CM | POA: Diagnosis not present

## 2015-06-19 DIAGNOSIS — M545 Low back pain: Secondary | ICD-10-CM | POA: Diagnosis not present

## 2015-06-19 DIAGNOSIS — M5126 Other intervertebral disc displacement, lumbar region: Secondary | ICD-10-CM | POA: Diagnosis not present

## 2015-06-24 DIAGNOSIS — M545 Low back pain: Secondary | ICD-10-CM | POA: Diagnosis not present

## 2015-06-24 DIAGNOSIS — R293 Abnormal posture: Secondary | ICD-10-CM | POA: Diagnosis not present

## 2015-06-24 DIAGNOSIS — M5126 Other intervertebral disc displacement, lumbar region: Secondary | ICD-10-CM | POA: Diagnosis not present

## 2015-06-24 DIAGNOSIS — M6281 Muscle weakness (generalized): Secondary | ICD-10-CM | POA: Diagnosis not present

## 2015-06-26 DIAGNOSIS — M6281 Muscle weakness (generalized): Secondary | ICD-10-CM | POA: Diagnosis not present

## 2015-06-26 DIAGNOSIS — M545 Low back pain: Secondary | ICD-10-CM | POA: Diagnosis not present

## 2015-06-26 DIAGNOSIS — M5126 Other intervertebral disc displacement, lumbar region: Secondary | ICD-10-CM | POA: Diagnosis not present

## 2015-06-26 DIAGNOSIS — R293 Abnormal posture: Secondary | ICD-10-CM | POA: Diagnosis not present

## 2015-06-30 ENCOUNTER — Encounter: Payer: Self-pay | Admitting: Primary Care

## 2015-06-30 ENCOUNTER — Ambulatory Visit (INDEPENDENT_AMBULATORY_CARE_PROVIDER_SITE_OTHER): Payer: Managed Care, Other (non HMO) | Admitting: Primary Care

## 2015-06-30 VITALS — BP 126/70 | HR 86 | Temp 97.7°F | Ht 72.0 in | Wt 146.0 lb

## 2015-06-30 DIAGNOSIS — Z720 Tobacco use: Secondary | ICD-10-CM

## 2015-06-30 DIAGNOSIS — G4700 Insomnia, unspecified: Secondary | ICD-10-CM

## 2015-06-30 DIAGNOSIS — F17200 Nicotine dependence, unspecified, uncomplicated: Secondary | ICD-10-CM

## 2015-06-30 MED ORDER — BUPROPION HCL ER (SR) 150 MG PO TB12: 150.0000 mg | ORAL_TABLET | Freq: Two times a day (BID) | ORAL | Status: DC

## 2015-06-30 NOTE — Assessment & Plan Note (Signed)
Longstanding history of difficutly staying asleep. Once managed on Xanax, used sparingly. No improvement with melatonin. Will consider starting Trazodone for insomnia, will not start today due to initiation of Bupropion. Will re-evaluate in 6 weeks.

## 2015-06-30 NOTE — Patient Instructions (Signed)
Start Bupropion tablets for tobacco abuse. Take 1 tablet by mouth daily for three days, then take 1 tablet by mouth twice daily thereafter.  Follow up in 6 weeks for re-evaluation of tobacco cessation.   Please schedule a physical with me in April 2017. You will also schedule a lab only appointment one week prior. We will discuss your lab results during your physical.  It was a pleasure to meet you today! Please don't hesitate to call me with any questions. Welcome to [facility]!

## 2015-06-30 NOTE — Progress Notes (Signed)
Subjective:    Patient ID: Don Christian, male    DOB: 04-25-1956, 59 y.o.   MRN: [id number]  HPI  Don Christian is a 59 year old male who presents today to establish care and discuss the problems mentioned below. He is a transfer from [facility] in [place]. Will reivew old records.  1) Tobacco abuse: Currently smokes swisher sweets cigars. He quit in May 2016 and restarted in August 2016. He currently underwent back surgery and is ready to quit again. He's smoked most of his life. He had a low dose screening CT scan this year which was negative for mass. He was once managed on Wellbutrin for smoking cessation which helped to reduce cravings, and is interested in restarting this medication.  2) Insomnia: Long standing history of difficulty staying asleep and has been managed before on Xanax that he would use sparingly. He typically gets about 4 hours of sleep on average. He denies daily worry, anxiety throughout the day, daytime tiredness, irritability.  Review of Systems  Constitutional: Positive for unexpected weight change.  HENT: Negative for rhinorrhea.   Respiratory: Negative for cough and shortness of breath.   Cardiovascular: Negative for chest pain.  Gastrointestinal: Negative for diarrhea and constipation.  Musculoskeletal: Negative for myalgias.       Chronic back pain, surgery in September 2016  Skin: Negative for rash.  Allergic/Immunologic: Positive for environmental allergies.  Neurological: Negative for dizziness, numbness and headaches.  Psychiatric/Behavioral:       Denies concerns for anxiety and depression       Past Medical History  Diagnosis Date  . Colon polyps   . Sleeping difficulties   . Prostate disorder   . DDD (degenerative disc disease), lumbar 09/16/2014  . BPH (benign prostatic hyperplasia) 09/16/2014  . Tobacco use disorder 09/16/2014  . Insomnia 09/16/2014  . Chronic back pain 09/16/2014  . COPD (chronic obstructive pulmonary disease)      mild  . Inguinal hernia 2004  . Full dentures   . Cataract     left eye    Social History   Social History  . Marital Status: Married    Spouse Name: N/A  . Number of Children: N/A  . Years of Education: N/A   Occupational History  . Not on file.   Social History Main Topics  . Smoking status: Former Smoker -- 1.75 packs/day for 37 years    Types: Cigars    Quit date: 05/23/2014  . Smokeless tobacco: Not on file     Comment: Quit cigarettes 2008, has smoked 3 cigars daily since 2013  . Alcohol Use: 0.0 oz/week    0 Standard drinks or equivalent per week     Comment: occassionally on social occassions  . Drug Use: No  . Sexual Activity: Not on file   Other Topics Concern  . Not on file   Social History Narrative   Work or School: no work currently, disabled from back injury in 2012   Home Situation: lives with wife   Spiritual Beliefs: none   Lifestyle: walks dog 2x per day;  Healthy diet per his report.   Enjoys spending time outdoors.           Past Surgical History  Procedure Laterality Date  . Hernia repair    . Tooth extraction    . Colonoscopy w/ biopsies and polypectomy    . Ganglion cyst excision      left leg  . Anterior lumbar fusion N/A 05/09/2015  Procedure: LUMBAR FOUR-FIVE ANTERIOR LUMBAR FUSION WITH ABDOMINAL EXPOSURE;  Surgeon: Eustace Moore, MD;  Location: [facility];  Service: Neurosurgery;  Laterality: N/A;  . Abdominal exposure N/A 05/09/2015    Procedure: ABDOMINAL EXPOSURE;  Surgeon: Rosetta Posner, MD;  Location: [facility];  Service: Vascular;  Laterality: N/A;    Family History  Problem Relation Age of Onset  . Cancer Mother     Lung  . Heart disease Mother   . Cancer Father     Lung with mets to brain  . Cancer Sister     Lung    No Known Allergies  Current Outpatient Prescriptions on File Prior to Visit  Medication Sig Dispense Refill  . ibuprofen (ADVIL,MOTRIN) 800 MG tablet Take 800 mg by mouth 2 (two) times daily  as needed (pain).     Marland Kitchen oxyCODONE-acetaminophen (PERCOCET/ROXICET) 5-325 MG per tablet Take 1 tablet by mouth every 6 (six) hours as needed for moderate pain.   0  . traMADol (ULTRAM) 50 MG tablet Take 50 mg by mouth every 6 (six) hours as needed for severe pain.    . tamsulosin (FLOMAX) 0.4 MG CAPS capsule TAKE 1 CAPSULE BY MOUTH EVERY DAY (Patient not taking: Reported on 06/30/2015) 90 capsule 3   No current facility-administered medications on file prior to visit.    BP 126/70 mmHg  Pulse 86  Temp(Src) 97.7 F (36.5 C) (Oral)  Ht 6' (1.829 m)  Wt 146 lb (66.225 kg)  BMI 19.80 kg/m2  SpO2 98%    Objective:   Physical Exam  Constitutional: He is oriented to person, place, and time. He appears well-nourished.  Cardiovascular: Normal rate and regular rhythm.   Pulmonary/Chest: Effort normal and breath sounds normal.  Neurological: He is alert and oriented to person, place, and time.  Skin: Skin is warm and dry.  Psychiatric: He has a normal mood and affect.          Assessment & Plan:

## 2015-06-30 NOTE — Progress Notes (Signed)
Pre visit review using our clinic review tool, if applicable. No additional management support is needed unless otherwise documented below in the visit note. 

## 2015-06-30 NOTE — Assessment & Plan Note (Signed)
Smokes swisher sweets cigars. Low dose CT scan completed in 2016. Quit in May 2016, restarted in August 2016. Once managed on Wellbutrin with improvement, is interested in restarting and is ready to quit. Start Bupropion 150 mg SR tablets.  Follow up in 6 weeks for re-evaluation.

## 2015-07-17 ENCOUNTER — Ambulatory Visit: Payer: Medicare Other | Admitting: Family Medicine

## 2015-08-11 ENCOUNTER — Ambulatory Visit (INDEPENDENT_AMBULATORY_CARE_PROVIDER_SITE_OTHER): Payer: Managed Care, Other (non HMO) | Admitting: Primary Care

## 2015-08-11 ENCOUNTER — Encounter: Payer: Self-pay | Admitting: Primary Care

## 2015-08-11 VITALS — BP 126/86 | HR 94 | Temp 97.7°F | Ht 72.0 in | Wt 151.8 lb

## 2015-08-11 DIAGNOSIS — Z720 Tobacco use: Secondary | ICD-10-CM

## 2015-08-11 DIAGNOSIS — M5136 Other intervertebral disc degeneration, lumbar region: Secondary | ICD-10-CM

## 2015-08-11 DIAGNOSIS — F17200 Nicotine dependence, unspecified, uncomplicated: Secondary | ICD-10-CM | POA: Diagnosis not present

## 2015-08-11 DIAGNOSIS — G4700 Insomnia, unspecified: Secondary | ICD-10-CM | POA: Diagnosis not present

## 2015-08-11 DIAGNOSIS — Z23 Encounter for immunization: Secondary | ICD-10-CM | POA: Diagnosis not present

## 2015-08-11 MED ORDER — BUPROPION HCL ER (XL) 150 MG PO TB24: 150.0000 mg | ORAL_TABLET | Freq: Every day | ORAL | Status: DC

## 2015-08-11 MED ORDER — TRAZODONE HCL 50 MG PO TABS: 25.0000 mg | ORAL_TABLET | Freq: Every day | ORAL | Status: DC

## 2015-08-11 NOTE — Progress Notes (Signed)
Subjective:    Patient ID: Don Christian, male    DOB: Dec 19, 1955, 59 y.o.   MRN: [id number]  HPI  Don Christian is a 59 year old male who presents today for follow up of tobacco cessation. He was evaluated in late September 2016 and was ready to quit smoking. He had been on Wellbutrin in the past with success. He smokes swisher sweets cigars, quit in May 2016 and restarted in August 2016. He was initiated on Bupropion SR 150 mg tablets BID last visit.   Since his last visit he's quit smoking. His last cigar was 1 month ago and he continues to have a struggle with cravings but is taking it one day at a time.   2) Insomnia: Long standing history of for the past 20+ years. He has difficulty staying asleep and will wake up within 3-4 hours of falling asleep. Typically it will take him an hour to fall back asleep, but recently (since starting Bupropion BID) it has taken him 3 hours to fall back asleep. He experiences daytime tiredness but cannot nap throughout the day. He's tried taking Melatonin, changing his bedtime routine, and other OTC medications without improvement.  Review of Systems  Constitutional: Negative for unexpected weight change.  Respiratory: Positive for cough. Negative for shortness of breath.   Cardiovascular: Negative for chest pain.  Psychiatric/Behavioral: Positive for sleep disturbance. Negative for suicidal ideas.       Past Medical History  Diagnosis Date  . Colon polyps   . Sleeping difficulties   . Prostate disorder   . DDD (degenerative disc disease), lumbar 09/16/2014  . BPH (benign prostatic hyperplasia) 09/16/2014  . Tobacco use disorder 09/16/2014  . Insomnia 09/16/2014  . Chronic back pain 09/16/2014  . COPD (chronic obstructive pulmonary disease) (HCC)     mild  . Inguinal hernia 2004  . Full dentures   . Cataract     left eye    Social History   Social History  . Marital Status: Married    Spouse Name: N/A  . Number of Children: N/A  .  Years of Education: N/A   Occupational History  . Not on file.   Social History Main Topics  . Smoking status: Former Smoker -- 1.75 packs/day for 37 years    Types: Cigars    Quit date: 05/23/2014  . Smokeless tobacco: Not on file     Comment: Quit cigarettes 2008, has smoked 3 cigars daily since 2013  . Alcohol Use: 0.0 oz/week    0 Standard drinks or equivalent per week     Comment: occassionally on social occassions  . Drug Use: No  . Sexual Activity: Not on file   Other Topics Concern  . Not on file   Social History Narrative   Work or School: no work currently, disabled from back injury in 2012   Home Situation: lives with wife   Spiritual Beliefs: none   Lifestyle: walks dog 2x per day;  Healthy diet per his report.   Enjoys spending time outdoors.           Past Surgical History  Procedure Laterality Date  . Hernia repair    . Tooth extraction    . Colonoscopy w/ biopsies and polypectomy    . Ganglion cyst excision      left leg  . Anterior lumbar fusion N/A 05/09/2015    Procedure: LUMBAR FOUR-FIVE ANTERIOR LUMBAR FUSION WITH ABDOMINAL EXPOSURE;  Surgeon: Eustace Moore, MD;  Location: [facility];  Service: Neurosurgery;  Laterality: N/A;  . Abdominal exposure N/A 05/09/2015    Procedure: ABDOMINAL EXPOSURE;  Surgeon: Rosetta Posner, MD;  Location: [facility];  Service: Vascular;  Laterality: N/A;    Family History  Problem Relation Age of Onset  . Cancer Mother     Lung  . Heart disease Mother   . Cancer Father     Lung with mets to brain  . Cancer Sister     Lung    No Known Allergies  Current Outpatient Prescriptions on File Prior to Visit  Medication Sig Dispense Refill  . ibuprofen (ADVIL,MOTRIN) 800 MG tablet Take 800 mg by mouth 2 (two) times daily as needed (pain).     Marland Kitchen oxyCODONE-acetaminophen (PERCOCET/ROXICET) 5-325 MG per tablet Take 1 tablet by mouth every 6 (six) hours as needed for moderate pain.   0  . traMADol (ULTRAM) 50 MG tablet  Take 50 mg by mouth every 6 (six) hours as needed for severe pain.    . tamsulosin (FLOMAX) 0.4 MG CAPS capsule TAKE 1 CAPSULE BY MOUTH EVERY DAY (Patient not taking: Reported on 06/30/2015) 90 capsule 3   No current facility-administered medications on file prior to visit.    BP 126/86 mmHg  Pulse 94  Temp(Src) 97.7 F (36.5 C) (Oral)  Ht 6' (1.829 m)  Wt 151 lb 12.8 oz (68.856 kg)  BMI 20.58 kg/m2  SpO2 96%    Objective:   Physical Exam  Constitutional: He appears well-nourished.  Cardiovascular: Normal rate and regular rhythm.   Pulmonary/Chest: Effort normal and breath sounds normal.  Skin: Skin is warm and dry.  Psychiatric: He has a normal mood and affect.          Assessment & Plan:

## 2015-08-11 NOTE — Assessment & Plan Note (Signed)
Currently meeting with ortho, may do MRI later this month.

## 2015-08-11 NOTE — Assessment & Plan Note (Signed)
Continues to struggle. No improvement with OTC's. Worse since taking Bupropion BID. Switched to Burpropion XL 150 mg, once daily dosing in AM. Start trazodone 25-50 mg tablet HS. Follow up in 6 weeks for re-evaluation.

## 2015-08-11 NOTE — Patient Instructions (Signed)
Start Bupropion XL 150 mg tablets for smoking cessation. Take 1 tablet by mouth every morning. Congratulations on your smoking cessation.  Start Trazodone tablets for insomnia. Take 1/2-1 tablet by mouth one hour prior to bed time.   Follow up in 6 weeks for re-evaluation of insomnia.   It was a pleasure to see you today!

## 2015-08-11 NOTE — Progress Notes (Signed)
Pre visit review using our clinic review tool, if applicable. No additional management support is needed unless otherwise documented below in the visit note. 

## 2015-08-11 NOTE — Assessment & Plan Note (Signed)
Quit 1 month ago. Congratulated him on this success. Some cough, overall improved. Managed on Bupropion. Switched to XL version to help with sleep. Follow up in 6 weeks.

## 2015-09-23 ENCOUNTER — Ambulatory Visit (INDEPENDENT_AMBULATORY_CARE_PROVIDER_SITE_OTHER): Payer: Managed Care, Other (non HMO) | Admitting: Primary Care

## 2015-09-23 ENCOUNTER — Encounter: Payer: Self-pay | Admitting: Primary Care

## 2015-09-23 VITALS — BP 110/70 | HR 76 | Temp 97.3°F | Ht 72.0 in | Wt 155.0 lb

## 2015-09-23 DIAGNOSIS — G4700 Insomnia, unspecified: Secondary | ICD-10-CM | POA: Diagnosis not present

## 2015-09-23 MED ORDER — ZOLPIDEM TARTRATE 5 MG PO TABS: 5.0000 mg | ORAL_TABLET | Freq: Every evening | ORAL | Status: DC | PRN

## 2015-09-23 NOTE — Patient Instructions (Addendum)
Start Zolpidem (Ambien) 5 mg. Take 1 tablet by mouth at bedtime. Take this 30 minutes to 1 hour prior to sleep.  Please call me in 1 week for an update.   Stop Trazodone.  It was a pleasure to see you today!

## 2015-09-23 NOTE — Assessment & Plan Note (Signed)
No improvement on Trazodone 50-100 mg at bedtime. Continues to wake up after 3.5 hours of sleep. Denies symptoms of GAD. Will stop Trazodone and try low dose Ambien. He is to update me in 1 week regarding medication.

## 2015-09-23 NOTE — Progress Notes (Signed)
Pre visit review using our clinic review tool, if applicable. No additional management support is needed unless otherwise documented below in the visit note. 

## 2015-09-23 NOTE — Progress Notes (Signed)
Subjective:    Patient ID: Don Christian, male    DOB: [birth year], 59 y.o.   MRN: [id number]  HPI  Don Christian is a 59 year old male who presents today for follow up of insomnia. He was evaluated last visit with struggles to stay asleep. He had no improvement on OTC's. Last visit his Wellbutrin was changed to XL as his twice daily dosing was likely contributing. He was also initiated on trazodone 25-50 mg tablets.  Since his last visit he's sleeping about 3.5-4 hours and will wake up. He's taken 50-100 mg tablets without noticing a difference. He doesn't have a problem falling asleep, but will wake up every night. He's feeling tired and irritable throughout the day due to lack of sleep. Once he's awake at night he will watch TV for an hour before going back to bed. Denies symptoms of anxiety and depression.  Review of Systems  Psychiatric/Behavioral: Positive for sleep disturbance. The patient is not nervous/anxious.        Past Medical History  Diagnosis Date  . Colon polyps   . Sleeping difficulties   . Prostate disorder   . DDD (degenerative disc disease), lumbar 09/16/2014  . BPH (benign prostatic hyperplasia) 09/16/2014  . Tobacco use disorder 09/16/2014  . Insomnia 09/16/2014  . Chronic back pain 09/16/2014  . COPD (chronic obstructive pulmonary disease) (HCC)     mild  . Inguinal hernia 2004  . Full dentures   . Cataract     left eye    Social History   Social History  . Marital Status: Married    Spouse Name: N/A  . Number of Children: N/A  . Years of Education: N/A   Occupational History  . Not on file.   Social History Main Topics  . Smoking status: Current Every Day Smoker -- 1.75 packs/day for 37 years    Types: Cigars    Last Attempt to Quit: 05/23/2014  . Smokeless tobacco: Not on file     Comment: Quit cigarettes 2008, has smoked 3 cigars daily since 2013  . Alcohol Use: 0.0 oz/week    0 Standard drinks or equivalent per week     Comment:  occassionally on social occassions  . Drug Use: No  . Sexual Activity: Not on file   Other Topics Concern  . Not on file   Social History Narrative   Work or School: no work currently, disabled from back injury in 2012   Home Situation: lives with wife   Spiritual Beliefs: none   Lifestyle: walks dog 2x per day;  Healthy diet per his report.   Enjoys spending time outdoors.           Past Surgical History  Procedure Laterality Date  . Hernia repair    . Tooth extraction    . Colonoscopy w/ biopsies and polypectomy    . Ganglion cyst excision      left leg  . Anterior lumbar fusion N/A 05/09/2015    Procedure: LUMBAR FOUR-FIVE ANTERIOR LUMBAR FUSION WITH ABDOMINAL EXPOSURE;  Surgeon: Eustace Moore, MD;  Location: [facility];  Service: Neurosurgery;  Laterality: N/A;  . Abdominal exposure N/A 05/09/2015    Procedure: ABDOMINAL EXPOSURE;  Surgeon: Rosetta Posner, MD;  Location: [facility];  Service: Vascular;  Laterality: N/A;    Family History  Problem Relation Age of Onset  . Cancer Mother     Lung  . Heart disease Mother   . Cancer Father  Lung with mets to brain  . Cancer Sister     Lung    No Known Allergies  Current Outpatient Prescriptions on File Prior to Visit  Medication Sig Dispense Refill  . buPROPion (WELLBUTRIN XL) 150 MG 24 hr tablet Take 1 tablet (150 mg total) by mouth daily. 30 tablet 5  . ibuprofen (ADVIL,MOTRIN) 800 MG tablet Take 800 mg by mouth 2 (two) times daily as needed (pain).     . tamsulosin (FLOMAX) 0.4 MG CAPS capsule TAKE 1 CAPSULE BY MOUTH EVERY DAY (Patient not taking: Reported on 06/30/2015) 90 capsule 3   No current facility-administered medications on file prior to visit.    BP 110/70 mmHg  Pulse 76  Temp(Src) 97.3 F (36.3 C) (Oral)  Ht 6' (1.829 m)  Wt 155 lb (70.308 kg)  BMI 21.02 kg/m2  SpO2 98%    Objective:   Physical Exam  Constitutional: He appears well-nourished.  Cardiovascular: Normal rate and regular  rhythm.   Pulmonary/Chest: Effort normal and breath sounds normal.  Skin: Skin is warm and dry.  Psychiatric: He has a normal mood and affect.          Assessment & Plan:

## 2015-09-30 ENCOUNTER — Telehealth: Payer: Self-pay | Admitting: Primary Care

## 2015-09-30 NOTE — Telephone Encounter (Signed)
Will you call Don Christian and ask how he's doing since starting the Ambien for insomnia? Thanks.

## 2015-09-30 NOTE — Telephone Encounter (Signed)
Lm on pts vm requesting a call back with status

## 2015-10-07 NOTE — Telephone Encounter (Signed)
Message left for patient to return my call.  

## 2015-11-03 DIAGNOSIS — M545 Low back pain: Secondary | ICD-10-CM | POA: Diagnosis not present

## 2015-11-19 ENCOUNTER — Telehealth: Payer: Self-pay | Admitting: Primary Care

## 2015-11-19 ENCOUNTER — Encounter: Payer: Self-pay | Admitting: Internal Medicine

## 2015-11-19 ENCOUNTER — Ambulatory Visit (INDEPENDENT_AMBULATORY_CARE_PROVIDER_SITE_OTHER): Payer: Managed Care, Other (non HMO) | Admitting: Internal Medicine

## 2015-11-19 VITALS — BP 120/80 | HR 100 | Temp 97.8°F | Wt 158.0 lb

## 2015-11-19 DIAGNOSIS — B019 Varicella without complication: Secondary | ICD-10-CM | POA: Insufficient documentation

## 2015-11-19 DIAGNOSIS — B029 Zoster without complications: Secondary | ICD-10-CM

## 2015-11-19 HISTORY — DX: Varicella without complication: B01.9

## 2015-11-19 HISTORY — DX: Zoster without complications: B02.9

## 2015-11-19 MED ORDER — VALACYCLOVIR HCL 1 G PO TABS: 1000.0000 mg | ORAL_TABLET | Freq: Three times a day (TID) | ORAL | Status: DC

## 2015-11-19 MED ORDER — HYDROCODONE-ACETAMINOPHEN 5-325 MG PO TABS: 1.0000 | ORAL_TABLET | Freq: Four times a day (QID) | ORAL | Status: DC | PRN

## 2015-11-19 NOTE — Progress Notes (Signed)
Subjective:    Patient ID: Don Christian, male    DOB: 03-25-1956, 60 y.o.   MRN: [id number]  HPI Here due to rash on right temple  Started 2 days ago Looks like bug bites Feels "like novocaine" on the right side of his face Has abnormal sensation in mouth and lip also  Does have pain Very sensitive in part of ear  No vision change ?low grade fever per wife  Current Outpatient Prescriptions on File Prior to Visit  Medication Sig Dispense Refill  . buPROPion (WELLBUTRIN XL) 150 MG 24 hr tablet Take 1 tablet (150 mg total) by mouth daily. 30 tablet 5  . ibuprofen (ADVIL,MOTRIN) 800 MG tablet Take 800 mg by mouth 2 (two) times daily as needed (pain).     Marland Kitchen zolpidem (AMBIEN) 5 MG tablet Take 1 tablet (5 mg total) by mouth at bedtime as needed for sleep. 10 tablet 0   No current facility-administered medications on file prior to visit.    No Known Allergies  Past Medical History  Diagnosis Date  . Colon polyps   . Sleeping difficulties   . Prostate disorder   . DDD (degenerative disc disease), lumbar 09/16/2014  . BPH (benign prostatic hyperplasia) 09/16/2014  . Tobacco use disorder 09/16/2014  . Insomnia 09/16/2014  . Chronic back pain 09/16/2014  . COPD (chronic obstructive pulmonary disease) (HCC)     mild  . Inguinal hernia 2004  . Full dentures   . Cataract     left eye    Past Surgical History  Procedure Laterality Date  . Hernia repair    . Tooth extraction    . Colonoscopy w/ biopsies and polypectomy    . Ganglion cyst excision      left leg  . Anterior lumbar fusion N/A 05/09/2015    Procedure: LUMBAR FOUR-FIVE ANTERIOR LUMBAR FUSION WITH ABDOMINAL EXPOSURE;  Surgeon: Eustace Moore, MD;  Location: [facility];  Service: Neurosurgery;  Laterality: N/A;  . Abdominal exposure N/A 05/09/2015    Procedure: ABDOMINAL EXPOSURE;  Surgeon: Rosetta Posner, MD;  Location: [facility];  Service: Vascular;  Laterality: N/A;    Family History  Problem Relation Age of  Onset  . Cancer Mother     Lung  . Heart disease Mother   . Cancer Father     Lung with mets to brain  . Cancer Sister     Lung    Social History   Social History  . Marital Status: Married    Spouse Name: N/A  . Number of Children: N/A  . Years of Education: N/A   Occupational History  . Not on file.   Social History Main Topics  . Smoking status: Current Every Day Smoker -- 1.75 packs/day for 37 years    Types: Cigars    Last Attempt to Quit: 05/23/2014  . Smokeless tobacco: Not on file     Comment: Quit cigarettes 2008, has smoked 3 cigars daily since 2013  . Alcohol Use: 0.0 oz/week    0 Standard drinks or equivalent per week     Comment: occassionally on social occassions  . Drug Use: No  . Sexual Activity: Not on file   Other Topics Concern  . Not on file   Social History Narrative   Work or School: no work currently, disabled from back injury in 2012   Home Situation: lives with wife   Spiritual Beliefs: none   Lifestyle: walks dog 2x per day;  Healthy diet per his report.   Enjoys spending time outdoors.          Review of Systems Only smoking a little now Hearing is not changed    Objective:   Physical Exam  Eyes:  No inflammation in eye  Skin:  Classic papulovesicular lesions on lower right forehead and cheek Lesions in right mouth as well          Assessment & Plan:  Here

## 2015-11-19 NOTE — Patient Instructions (Signed)

## 2015-11-19 NOTE — Telephone Encounter (Signed)
See OV note Was shingles

## 2015-11-19 NOTE — Assessment & Plan Note (Signed)
Seems to be in V2---given oral lesions and on cheek It appears to be directly next to right eye though---if V2, it shouldn't affect eye but will have eye exam anyway. Valacyclovir for 10 days norco

## 2015-11-19 NOTE — Progress Notes (Signed)
Pre visit review using our clinic review tool, if applicable. No additional management support is needed unless otherwise documented below in the visit note. 

## 2015-11-19 NOTE — Telephone Encounter (Signed)
Patient Name: Don Christian  DOB: 1956-09-28    Initial Comment Caller states husband has 3 insect bites on side of head - lips swollen   Nurse Assessment  Nurse: Orvan Seen, RN, Jacquilin Date/Time (Eastern Time): 11/19/2015 8:22:16 AM  Confirm and document reason for call. If symptomatic, describe symptoms. You must click the next button to save text entered. ---Caller states husband has 3 insect bites on side of head - lips swollen. Started Sunday. No SOB.  Has the patient traveled out of the country within the last 30 days? ---No  Does the patient have any new or worsening symptoms? ---Yes  Will a triage be completed? ---Yes  Related visit to physician within the last 2 weeks? ---No  Does the PT have any chronic conditions? (i.e. diabetes, asthma, etc.) ---No  Is this a behavioral health or substance abuse call? ---No     Guidelines    Guideline Title Affirmed Question Affirmed Notes  Lip Swelling Toothache    Final Disposition User   See Physician within 4 Hours (or PCP triage) Orvan Seen, RN, Jacquilin    Comments  Appointment made with Dr. Silvio Pate at 0900 - PCP did not having any openings until after the 4 hour mark   Referrals  REFERRED TO PCP OFFICE   Disagree/Comply: Comply

## 2015-11-28 ENCOUNTER — Other Ambulatory Visit: Payer: Self-pay

## 2015-11-28 MED ORDER — HYDROCODONE-ACETAMINOPHEN 5-325 MG PO TABS: 1.0000 | ORAL_TABLET | Freq: Four times a day (QID) | ORAL | Status: DC | PRN

## 2015-11-28 NOTE — Telephone Encounter (Signed)
Pt left v/m requesting rx hydrocodone apap. Call when ready for pick up. Pt seen and rx last printed # 30 on 11/19/15. Pt also wants to know what to take for the itching of the shingles on his face. Pt request cb. Dr Silvio Pate out of office. Pt has one pill left of hydrocodone apap.

## 2015-11-28 NOTE — Telephone Encounter (Signed)
Called and notified patient of Don Christian's comments. Patient verbalized understanding.  

## 2015-12-15 ENCOUNTER — Telehealth: Payer: Self-pay | Admitting: Primary Care

## 2015-12-15 ENCOUNTER — Other Ambulatory Visit: Payer: Self-pay

## 2015-12-15 DIAGNOSIS — B0229 Other postherpetic nervous system involvement: Secondary | ICD-10-CM

## 2015-12-15 MED ORDER — GABAPENTIN 300 MG PO CAPS: ORAL_CAPSULE | ORAL | Status: DC

## 2015-12-15 NOTE — Telephone Encounter (Signed)
Called and notified patient of Kate's comments. Patient stated that the pain is at the bottom of his jaw. Pain along the gum line like a tooth ache at times also noted that patient does not have any teeth. Patient stated also spots on his face have scabb over on the right side, his face feels different on that side of the face.

## 2015-12-15 NOTE — Telephone Encounter (Signed)
Pt left v/m requesting rx hydrocodone apap. Call when ready for pick up. Last printed # 30 on 11/28/15; pt last seen for shingles on 11/19/15.

## 2015-12-15 NOTE — Telephone Encounter (Signed)
I've sent in a medication for his pain called gabapentin. He will take 1 capsule by mouth once daily on day 1, then 1 capsule by mouth twice daily on day 2, then 1 capsule by mouth three times daily thereafter as needed for pain. Caution as this medication may cause drowsiness.

## 2015-12-15 NOTE — Telephone Encounter (Signed)
Please call and check on Don Christian. He is requesting another refill on his hydrocodone. Is he still having pain from the shingles? If so, please have him describe his pain.  If so, then hydrocodone is not the best treatment. I will send in a prescription for gabapentin that will be better suited for his type of pain.

## 2015-12-16 NOTE — Telephone Encounter (Signed)
Notified patient's wife of Kate's comments as patient's wife had appointment.

## 2016-01-13 ENCOUNTER — Encounter: Payer: Self-pay | Admitting: Primary Care

## 2016-01-16 ENCOUNTER — Other Ambulatory Visit: Payer: Self-pay | Admitting: Primary Care

## 2016-01-16 DIAGNOSIS — E785 Hyperlipidemia, unspecified: Secondary | ICD-10-CM

## 2016-01-16 DIAGNOSIS — R7303 Prediabetes: Secondary | ICD-10-CM

## 2016-01-16 DIAGNOSIS — Z1159 Encounter for screening for other viral diseases: Secondary | ICD-10-CM

## 2016-01-22 ENCOUNTER — Other Ambulatory Visit (INDEPENDENT_AMBULATORY_CARE_PROVIDER_SITE_OTHER): Payer: Managed Care, Other (non HMO)

## 2016-01-22 DIAGNOSIS — R7303 Prediabetes: Secondary | ICD-10-CM

## 2016-01-22 DIAGNOSIS — E785 Hyperlipidemia, unspecified: Secondary | ICD-10-CM

## 2016-01-22 DIAGNOSIS — Z1159 Encounter for screening for other viral diseases: Secondary | ICD-10-CM

## 2016-01-22 LAB — COMPREHENSIVE METABOLIC PANEL
ALBUMIN: 4.5 g/dL (ref 3.5–5.2)
ALK PHOS: 79 U/L (ref 39–117)
ALT: 13 U/L (ref 0–53)
AST: 15 U/L (ref 0–37)
BILIRUBIN TOTAL: 0.6 mg/dL (ref 0.2–1.2)
BUN: 14 mg/dL (ref 6–23)
CO2: 29 mEq/L (ref 19–32)
Calcium: 10.2 mg/dL (ref 8.4–10.5)
Chloride: 100 mEq/L (ref 96–112)
Creatinine, Ser: 0.9 mg/dL (ref 0.40–1.50)
GFR: 91.2 mL/min (ref >60.00)
GLUCOSE: 101 mg/dL — AB (ref 70–99)
Potassium: 5.5 mEq/L — ABNORMAL HIGH (ref 3.5–5.1)
SODIUM: 134 meq/L — AB (ref 135–145)
TOTAL PROTEIN: 7.3 g/dL (ref 6.0–8.3)

## 2016-01-22 LAB — LIPID PANEL
CHOL/HDL RATIO: 4
Cholesterol: 224 mg/dL — ABNORMAL HIGH (ref 0–200)
HDL: 54.8 mg/dL (ref >39.00)
LDL CALC: 153 mg/dL — AB (ref 0–99)
NONHDL: 168.94
TRIGLYCERIDES: 81 mg/dL (ref 0.0–149.0)
VLDL: 16.2 mg/dL (ref 0.0–40.0)

## 2016-01-22 LAB — HEMOGLOBIN A1C: HEMOGLOBIN A1C: 5.7 % (ref 4.6–6.5)

## 2016-01-23 ENCOUNTER — Telehealth: Payer: Self-pay | Admitting: Primary Care

## 2016-01-23 LAB — HEPATITIS C ANTIBODY: HCV Ab: NEGATIVE

## 2016-01-23 NOTE — Telephone Encounter (Signed)
Called and notified patient of Kate's comments. Patient verbalized understanding.  

## 2016-01-23 NOTE — Telephone Encounter (Signed)
Patient returned Chan's call. °

## 2016-01-27 ENCOUNTER — Encounter: Payer: Self-pay | Admitting: Primary Care

## 2016-01-27 ENCOUNTER — Ambulatory Visit (INDEPENDENT_AMBULATORY_CARE_PROVIDER_SITE_OTHER): Payer: Managed Care, Other (non HMO) | Admitting: Primary Care

## 2016-01-27 ENCOUNTER — Ambulatory Visit (INDEPENDENT_AMBULATORY_CARE_PROVIDER_SITE_OTHER)
Admission: RE | Admit: 2016-01-27 | Discharge: 2016-01-27 | Disposition: A | Discharge status: Home / Self Care | Payer: Managed Care, Other (non HMO) | Source: Ambulatory Visit | Attending: Primary Care | Admitting: Primary Care

## 2016-01-27 VITALS — BP 118/74 | HR 85 | Temp 97.6°F | Ht 72.0 in | Wt 156.4 lb

## 2016-01-27 DIAGNOSIS — L299 Pruritus, unspecified: Secondary | ICD-10-CM | POA: Diagnosis not present

## 2016-01-27 DIAGNOSIS — Z0000 Encounter for general adult medical examination without abnormal findings: Secondary | ICD-10-CM

## 2016-01-27 DIAGNOSIS — G4700 Insomnia, unspecified: Secondary | ICD-10-CM

## 2016-01-27 DIAGNOSIS — F17200 Nicotine dependence, unspecified, uncomplicated: Secondary | ICD-10-CM

## 2016-01-27 DIAGNOSIS — E875 Hyperkalemia: Secondary | ICD-10-CM

## 2016-01-27 DIAGNOSIS — M25511 Pain in right shoulder: Secondary | ICD-10-CM | POA: Insufficient documentation

## 2016-01-27 DIAGNOSIS — F32A Depression, unspecified: Secondary | ICD-10-CM

## 2016-01-27 DIAGNOSIS — Z0001 Encounter for general adult medical examination with abnormal findings: Secondary | ICD-10-CM | POA: Diagnosis not present

## 2016-01-27 DIAGNOSIS — F329 Major depressive disorder, single episode, unspecified: Secondary | ICD-10-CM | POA: Diagnosis not present

## 2016-01-27 DIAGNOSIS — B019 Varicella without complication: Secondary | ICD-10-CM

## 2016-01-27 DIAGNOSIS — B029 Zoster without complications: Secondary | ICD-10-CM

## 2016-01-27 DIAGNOSIS — F419 Anxiety disorder, unspecified: Secondary | ICD-10-CM | POA: Insufficient documentation

## 2016-01-27 LAB — BASIC METABOLIC PANEL
BUN: 20 mg/dL (ref 6–23)
CO2: 31 meq/L (ref 19–32)
Calcium: 10.2 mg/dL (ref 8.4–10.5)
Chloride: 99 mEq/L (ref 96–112)
Creatinine, Ser: 1 mg/dL (ref 0.40–1.50)
GFR: 80.76 mL/min (ref >60.00)
GLUCOSE: 106 mg/dL — AB (ref 70–99)
POTASSIUM: 4.9 meq/L (ref 3.5–5.1)
Sodium: 135 mEq/L (ref 135–145)

## 2016-01-27 MED ORDER — TRIAMCINOLONE ACETONIDE 0.1 % EX CREA: 1.0000 "application " | TOPICAL_CREAM | Freq: Two times a day (BID) | CUTANEOUS | Status: DC

## 2016-01-27 MED ORDER — TRAZODONE HCL 100 MG PO TABS: 100.0000 mg | ORAL_TABLET | Freq: Every day | ORAL | Status: DC

## 2016-01-27 MED ORDER — BUPROPION HCL ER (XL) 300 MG PO TB24: 300.0000 mg | ORAL_TABLET | Freq: Every day | ORAL | Status: DC

## 2016-01-27 NOTE — Assessment & Plan Note (Signed)
History of for years. Managed on Wellbutrin 150 mg. Symptoms of depressed mood, no interest in doing things. PHQ 9 score of 10-12 today. Denies SI/HI. Increase Wellbutrin to 300 mg every morning. Will follow up on symptoms in 4-6 weeks.

## 2016-01-27 NOTE — Progress Notes (Signed)
Subjective:    Patient ID: Don Christian, male    DOB: 1956/06/28, 60 y.o.   MRN: [id number]  HPI  Mr. Reges is a 60 year old male who presents today for complete physical and the problems mentioned below.  Immunizations: -Tetanus: Completed within the last 10 years. Believes it was in 2009. -Influenza: Obtained in November 2016   Diet: Endorses a fair diet. Breakfast: Fruit, oatmeal, cereal, eggs Lunch: Left overs Dinner: Meat, vegetable, potato, rice, occasional pizza Snacks:Candy Desserts: Candy daily, chocolate  Beverages: Coffee, water, occasional milk  Exercise: He does not currently exercise.  Eye exam: Completed in February 2017, due again for post shingles follow up.  Dental exam: Completed in years.  Colonoscopy: Completed in 2008, due in 2018  1) Post Herpatic Neuralgia/itching: Continues to experience discomfort and itching to the right side of his face. Diagnosed with shingles in mid February 2017. He's tried applying Gold Bond, Cortizone 10, Calomine lotion without improvement in itching. He's been managed on Gabapentin and hydrocodone without much improvement in discomfort. His main concern today is the itching.  2) Right shoulder pain: First noticed after throwing a tennis ball at a squirrel in December 2016. His pain initially became bothersome him with any movement that requires raising his right upper extremity over his head. Since then he's noticed pain with all positions of abduction. He's taken ibuprofen as needed for pain with temporary improvement. He's had no imaging of his shoulder. Denies numbness/tingling.    Review of Systems  Constitutional: Negative for unexpected weight change.  HENT: Negative for rhinorrhea.   Respiratory: Negative for cough and shortness of breath.   Cardiovascular: Negative for chest pain.  Genitourinary: Negative for difficulty urinating.  Musculoskeletal:       Right shoulder pain  Skin: Negative for rash and wound.     Facial itching post shingles  Allergic/Immunologic: Positive for environmental allergies.  Neurological: Negative for numbness.       Occasional headaches  Psychiatric/Behavioral:       See HPI       Past Medical History  Diagnosis Date  . Colon polyps   . Sleeping difficulties   . Prostate disorder   . DDD (degenerative disc disease), lumbar 09/16/2014  . BPH (benign prostatic hyperplasia) 09/16/2014  . Tobacco use disorder 09/16/2014  . Insomnia 09/16/2014  . Chronic back pain 09/16/2014  . COPD (chronic obstructive pulmonary disease) (HCC)     mild  . Inguinal hernia 2004  . Full dentures   . Cataract     left eye     Social History   Social History  . Marital Status: Married    Spouse Name: N/A  . Number of Children: N/A  . Years of Education: N/A   Occupational History  . Not on file.   Social History Main Topics  . Smoking status: Current Every Day Smoker -- 1.75 packs/day for 37 years    Types: Cigars    Last Attempt to Quit: 05/23/2014  . Smokeless tobacco: Not on file     Comment: Quit cigarettes 2008, has smoked 3 cigars daily since 2013  . Alcohol Use: 0.0 oz/week    0 Standard drinks or equivalent per week     Comment: occassionally on social occassions  . Drug Use: No  . Sexual Activity: Not on file   Other Topics Concern  . Not on file   Social History Narrative   Work or School: no work currently, disabled from back injury  in 2012   Home Situation: lives with wife   Spiritual Beliefs: none   Lifestyle: walks dog 2x per day;  Healthy diet per his report.   Enjoys spending time outdoors.           Past Surgical History  Procedure Laterality Date  . Hernia repair    . Tooth extraction    . Colonoscopy w/ biopsies and polypectomy    . Ganglion cyst excision      left leg  . Anterior lumbar fusion N/A 05/09/2015    Procedure: LUMBAR FOUR-FIVE ANTERIOR LUMBAR FUSION WITH ABDOMINAL EXPOSURE;  Surgeon: Eustace Moore, MD;  Location: [place]  [facility];  Service: Neurosurgery;  Laterality: N/A;  . Abdominal exposure N/A 05/09/2015    Procedure: ABDOMINAL EXPOSURE;  Surgeon: Rosetta Posner, MD;  Location: [facility];  Service: Vascular;  Laterality: N/A;    Family History  Problem Relation Age of Onset  . Cancer Mother     Lung  . Heart disease Mother   . Cancer Father     Lung with mets to brain  . Cancer Sister     Lung    No Known Allergies  Current Outpatient Prescriptions on File Prior to Visit  Medication Sig Dispense Refill  . ibuprofen (ADVIL,MOTRIN) 800 MG tablet Take 800 mg by mouth 2 (two) times daily as needed (pain).      No current facility-administered medications on file prior to visit.    BP 118/74 mmHg  Pulse 85  Temp(Src) 97.6 F (36.4 C) (Oral)  Ht 6' (1.829 m)  Wt 156 lb 6.4 oz (70.943 kg)  BMI 21.21 kg/m2  SpO2 97%    Objective:   Physical Exam  Constitutional: He is oriented to person, place, and time. He appears well-nourished.  HENT:  Right Ear: Tympanic membrane and ear canal normal.  Left Ear: Tympanic membrane and ear canal normal.  Nose: Nose normal. Right sinus exhibits no maxillary sinus tenderness and no frontal sinus tenderness. Left sinus exhibits no maxillary sinus tenderness and no frontal sinus tenderness.  Mouth/Throat: Oropharynx is clear and moist.  Eyes: Conjunctivae and EOM are normal. Pupils are equal, round, and reactive to light.  Neck: Neck supple. Carotid bruit is not present. No thyromegaly present.  Cardiovascular: Normal rate, regular rhythm and normal heart sounds.   Pulmonary/Chest: Effort normal and breath sounds normal. He has no wheezes. He has no rales.  Abdominal: Soft. Bowel sounds are normal. There is no tenderness.  Musculoskeletal:       Right shoulder: He exhibits decreased range of motion and pain. He exhibits no tenderness and no deformity.  Neurological: He is alert and oriented to person, place, and time. He has normal reflexes. No cranial  nerve deficit.  Skin: Skin is warm and dry. No rash noted.  Psychiatric: He has a normal mood and affect.          Assessment & Plan:  Hyperkalemia:  Noted on recent labs. Due for repeat today. Asymptomatic. Pending labs now.

## 2016-01-27 NOTE — Assessment & Plan Note (Signed)
Currently continues to smoke. Increase Wellbutrin to 300 mg today for depression and also for tobacco abuse. Discussed importance of cessation.

## 2016-01-27 NOTE — Assessment & Plan Note (Signed)
Tdap and flu UTD. Will administer Zostavax next visit. Labs with hyperlipidemia and hyperkalemia. Will readdress both. Exam with moderate reduction in ROM to right shoulder, otherwise unremarkable. Xray pending. Discussed the importance of a healthy diet and regular exercise to reduce risk of other medical diseases.  Follow up in 1 year for repeat physical.

## 2016-01-27 NOTE — Assessment & Plan Note (Signed)
No improvement with Trazodone or Ambien.  Increase Trazodone to 100 mg HS. He is to notify me if no improvement.

## 2016-01-27 NOTE — Addendum Note (Signed)
Addended by: Marchia Bond on: 01/27/2016 03:44 PM   Modules accepted: Miquel Dunn

## 2016-01-27 NOTE — Progress Notes (Signed)
Pre visit review using our clinic review tool, if applicable. No additional management support is needed unless otherwise documented below in the visit note. 

## 2016-01-27 NOTE — Patient Instructions (Addendum)
We've increased your Wellbutrin from 150 mg to 300 mg. Take 1 tablet by mouth every morning.  We've increased your Trazodone from 50 mg to 100 mg. Take 1 tablet by mouth 1 hour prior to bedtime.  You may apply Triamcinolone cream twice daily as needed for itching. Also start taking a daily antihistamine such as Zyrtec at bedtime to help with itching.  Complete lab work prior to leaving today. I will notify you of your results once received.   Complete xray(s) prior to leaving today. I will notify you of your results once received.  You will be contacted regarding your referral to Dr. Alfonso Ramus.  Please let us know if you have not heard back within one week.   Start exercising. You should be getting 1 hour of moderate intensity exercise 5 days weekly.  Please notify me if no improvement in depression, sleep, facial itching.  Follow up in 1 year for repeat physical or sooner if needed.  It was a pleasure to see you today!

## 2016-01-27 NOTE — Assessment & Plan Note (Signed)
Residual facial itching with discomfort to right side. No rash or lesions today. RX for low-mid potency steroid cream BID provided. He is to follow up PRN.

## 2016-01-27 NOTE — Assessment & Plan Note (Signed)
Present since December 2016, worse now. Exam with moderate decrease in ROM with abduction. Xray ordered and pending. Referral placed to his orthopedist Dr. Alfonso Ramus for further evaluation.

## 2016-01-29 DIAGNOSIS — M7581 Other shoulder lesions, right shoulder: Secondary | ICD-10-CM | POA: Diagnosis not present

## 2016-02-02 ENCOUNTER — Telehealth: Payer: Self-pay | Admitting: *Deleted

## 2016-02-02 NOTE — Telephone Encounter (Signed)
Notified patient that his yearly follow up lung cancer screening low dose CT scan is due. Confirmed that patient is within age range of 55-77, asymptomatic of lung cancer, and no other serious disease processes that would make treatment of lung cancer not possible. The patient is a  current smoker with a 37.5  pack year history. The shared decision making visit was completed 01/16/2015. The patient is agreeable for CT scan to be scheduled once insurance has approved it.

## 2016-02-24 ENCOUNTER — Other Ambulatory Visit: Payer: Self-pay | Admitting: Primary Care

## 2016-02-24 NOTE — Telephone Encounter (Signed)
Electronically refill request for   bupropion (WELLBUTRIN XL) 150 MG 24 hr tablet    Last prescribed on 08/11/2015. Last seen on 01/27/2016. No future appt.

## 2016-03-02 DIAGNOSIS — M545 Low back pain: Secondary | ICD-10-CM | POA: Diagnosis not present

## 2016-03-10 ENCOUNTER — Encounter: Payer: Self-pay | Admitting: Family Medicine

## 2016-03-10 ENCOUNTER — Other Ambulatory Visit: Payer: Self-pay | Admitting: Family Medicine

## 2016-03-10 DIAGNOSIS — Z87891 Personal history of nicotine dependence: Secondary | ICD-10-CM

## 2016-03-10 HISTORY — DX: Personal history of nicotine dependence: Z87.891

## 2016-03-16 ENCOUNTER — Encounter: Payer: Self-pay | Admitting: Primary Care

## 2016-03-19 ENCOUNTER — Ambulatory Visit
Admission: RE | Admit: 2016-03-19 | Discharge: 2016-03-19 | Disposition: A | Discharge status: Home / Self Care | Payer: Managed Care, Other (non HMO) | Source: Ambulatory Visit | Attending: Family Medicine | Admitting: Family Medicine

## 2016-03-19 DIAGNOSIS — Z87891 Personal history of nicotine dependence: Secondary | ICD-10-CM | POA: Diagnosis not present

## 2016-03-19 DIAGNOSIS — J439 Emphysema, unspecified: Secondary | ICD-10-CM | POA: Diagnosis not present

## 2016-03-25 ENCOUNTER — Telehealth: Payer: Self-pay | Admitting: *Deleted

## 2016-03-25 NOTE — Telephone Encounter (Signed)
Notified patient of LDCT lung cancer screening results with recommendation for 12 month follow up imaging. Also notified of incidental finding noted below. Patient verbalizes understanding.   IMPRESSION: 1. Lung-RADS Category 2, benign appearance or behavior. Continue annual screening with low-dose chest CT without contrast in 12 months. 2. Mild emphysema with mild diffuse bronchial wall thickening, suggesting COPD.

## 2016-05-19 ENCOUNTER — Other Ambulatory Visit: Payer: Self-pay | Admitting: Primary Care

## 2016-05-19 DIAGNOSIS — F329 Major depressive disorder, single episode, unspecified: Secondary | ICD-10-CM

## 2016-05-19 DIAGNOSIS — F32A Depression, unspecified: Secondary | ICD-10-CM

## 2016-06-15 ENCOUNTER — Other Ambulatory Visit: Payer: Self-pay | Admitting: Family Medicine

## 2016-06-24 ENCOUNTER — Other Ambulatory Visit: Payer: Self-pay | Admitting: Family Medicine

## 2016-06-29 ENCOUNTER — Other Ambulatory Visit: Payer: Self-pay

## 2016-06-29 DIAGNOSIS — N400 Enlarged prostate without lower urinary tract symptoms: Secondary | ICD-10-CM

## 2016-06-29 MED ORDER — TAMSULOSIN HCL 0.4 MG PO CAPS: 0.4000 mg | ORAL_CAPSULE | Freq: Every day | ORAL | 0 refills | Status: DC

## 2016-06-29 MED ORDER — TAMSULOSIN HCL 0.4 MG PO CAPS: 0.4000 mg | ORAL_CAPSULE | Freq: Every day | ORAL | 3 refills | Status: DC

## 2016-06-29 NOTE — Telephone Encounter (Signed)
Pt left v/m; pt had annual exam on 01/27/16; do not see tamsulosin mentioned in visit; Allie Bossier NP has not filled tamsulosin before. Please advise.[facility] [address]

## 2016-06-29 NOTE — Telephone Encounter (Signed)
Spoken to patient. He stated that patient is not seeing urology anymore. This mediation was first prescribed by his primary in [place] then by his PCP before [place]. Patient stated that he takes it daily and been on it for 2 1/2 years now.

## 2016-06-29 NOTE — Addendum Note (Signed)
Addended by: Pleas Koch on: 06/29/2016 05:38 PM   Modules accepted: Orders

## 2016-06-29 NOTE — Telephone Encounter (Signed)
Noted. Refill(s) sent to pharmacy.  

## 2016-07-28 NOTE — Telephone Encounter (Signed)
Pt request refill tamsulosin to [facility] [address]. [facility] spoke with [facility] and tamsulosin refills available and will get ready for pt to pick up. Pt voiced understanding.

## 2016-12-13 ENCOUNTER — Other Ambulatory Visit: Payer: Self-pay | Admitting: Primary Care

## 2016-12-13 DIAGNOSIS — F329 Major depressive disorder, single episode, unspecified: Secondary | ICD-10-CM

## 2016-12-13 DIAGNOSIS — F32A Depression, unspecified: Secondary | ICD-10-CM

## 2016-12-13 NOTE — Telephone Encounter (Signed)
Ok to refill? Electronically refill request for buPROPion (WELLBUTRIN XL) 300 MG 24 hr tablet. Last prescribed on 05/20/2016. Last seen on 01/27/2016

## 2016-12-17 NOTE — Telephone Encounter (Signed)
Message left for patient to return my call on 12/16/2016 and 12/17/2016

## 2016-12-21 NOTE — Telephone Encounter (Signed)
CPE has been scheduled 

## 2017-01-16 ENCOUNTER — Other Ambulatory Visit: Payer: Self-pay | Admitting: Primary Care

## 2017-01-16 DIAGNOSIS — R7303 Prediabetes: Secondary | ICD-10-CM

## 2017-01-16 DIAGNOSIS — E785 Hyperlipidemia, unspecified: Secondary | ICD-10-CM

## 2017-01-18 ENCOUNTER — Ambulatory Visit (INDEPENDENT_AMBULATORY_CARE_PROVIDER_SITE_OTHER): Payer: Managed Care, Other (non HMO)

## 2017-01-18 VITALS — BP 104/68 | HR 79 | Temp 98.0°F | Ht 71.5 in | Wt 146.8 lb

## 2017-01-18 DIAGNOSIS — Z0000 Encounter for general adult medical examination without abnormal findings: Secondary | ICD-10-CM

## 2017-01-18 DIAGNOSIS — Z114 Encounter for screening for human immunodeficiency virus [HIV]: Secondary | ICD-10-CM

## 2017-01-18 DIAGNOSIS — R7303 Prediabetes: Secondary | ICD-10-CM

## 2017-01-18 DIAGNOSIS — E785 Hyperlipidemia, unspecified: Secondary | ICD-10-CM | POA: Diagnosis not present

## 2017-01-18 LAB — COMPREHENSIVE METABOLIC PANEL
ALK PHOS: 79 U/L (ref 39–117)
ALT: 19 U/L (ref 0–53)
AST: 15 U/L (ref 0–37)
Albumin: 4.4 g/dL (ref 3.5–5.2)
BILIRUBIN TOTAL: 0.7 mg/dL (ref 0.2–1.2)
BUN: 16 mg/dL (ref 6–23)
CO2: 29 mEq/L (ref 19–32)
Calcium: 10 mg/dL (ref 8.4–10.5)
Chloride: 102 mEq/L (ref 96–112)
Creatinine, Ser: 1.03 mg/dL (ref 0.40–1.50)
GFR: 77.8 mL/min (ref >60.00)
GLUCOSE: 106 mg/dL — AB (ref 70–99)
POTASSIUM: 5.3 meq/L — AB (ref 3.5–5.1)
Sodium: 135 mEq/L (ref 135–145)
TOTAL PROTEIN: 7.3 g/dL (ref 6.0–8.3)

## 2017-01-18 LAB — LIPID PANEL
Cholesterol: 200 mg/dL (ref 0–200)
HDL: 47.7 mg/dL (ref >39.00)
LDL Cholesterol: 140 mg/dL — ABNORMAL HIGH (ref 0–99)
NonHDL: 152.35
TRIGLYCERIDES: 60 mg/dL (ref 0.0–149.0)
Total CHOL/HDL Ratio: 4
VLDL: 12 mg/dL (ref 0.0–40.0)

## 2017-01-18 LAB — HEMOGLOBIN A1C: HEMOGLOBIN A1C: 6 % (ref 4.6–6.5)

## 2017-01-18 NOTE — Progress Notes (Signed)
Subjective:   Don Christian is a 61 y.o. male who presents for an Initial Medicare Annual Wellness Visit.  Review of Systems  N/A Cardiac Risk Factors include: advanced age (>73men, >35 women);male gender    Objective:    Today's Vitals   01/18/17 0827 01/18/17 0846  BP: 104/68   Pulse: 79   Temp: 98 F (36.7 C)   TempSrc: Oral   SpO2: 98%   Weight: 146 lb 12 oz (66.6 kg)   Height: 5' 11.5" (1.816 m)   PainSc: 3  3   PainLoc: Back    Body mass index is 20.18 kg/m.  Current Medications (verified) Outpatient Encounter Prescriptions as of 01/18/2017  Medication Sig  . buPROPion (WELLBUTRIN XL) 300 MG 24 hr tablet TAKE 1 TABLET(300 MG) BY MOUTH DAILY  . tamsulosin (FLOMAX) 0.4 MG CAPS capsule Take 1 capsule (0.4 mg total) by mouth daily.  . traZODone (DESYREL) 100 MG tablet Take 1 tablet (100 mg total) by mouth at bedtime.  . [DISCONTINUED] buPROPion (WELLBUTRIN XL) 300 MG 24 hr tablet TAKE 1 TABLET(300 MG) BY MOUTH DAILY  . [DISCONTINUED] cyclobenzaprine (FLEXERIL) 10 MG tablet Take 10 mg by mouth every 8 (eight) hours as needed for muscle spasms.  . [DISCONTINUED] ibuprofen (ADVIL,MOTRIN) 800 MG tablet Take 800 mg by mouth 2 (two) times daily as needed (pain).   . [DISCONTINUED] triamcinolone cream (KENALOG) 0.1 % Apply 1 application topically 2 (two) times daily.   No facility-administered encounter medications on file as of 01/18/2017.     Allergies (verified) Patient has no known allergies.   History: Past Medical History:  Diagnosis Date  . BPH (benign prostatic hyperplasia) 09/16/2014  . Cataract    left eye  . Chronic back pain 09/16/2014  . Colon polyps   . COPD (chronic obstructive pulmonary disease) (HCC)    mild  . DDD (degenerative disc disease), lumbar 09/16/2014  . Full dentures   . Inguinal hernia 2004  . Insomnia 09/16/2014  . Personal history of tobacco use, presenting hazards to health 03/10/2016  . Prostate disorder   . Sleeping difficulties    . Tobacco use disorder 09/16/2014   Past Surgical History:  Procedure Laterality Date  . ABDOMINAL EXPOSURE N/A 05/09/2015   Procedure: ABDOMINAL EXPOSURE;  Surgeon: Rosetta Posner, MD;  Location: [place] [facility];  Service: Vascular;  Laterality: N/A;  . ANTERIOR LUMBAR FUSION N/A 05/09/2015   Procedure: LUMBAR FOUR-FIVE ANTERIOR LUMBAR FUSION WITH ABDOMINAL EXPOSURE;  Surgeon: Eustace Moore, MD;  Location: [place] [facility];  Service: Neurosurgery;  Laterality: N/A;  . COLONOSCOPY W/ BIOPSIES AND POLYPECTOMY    . GANGLION CYST EXCISION     left leg  . HERNIA REPAIR    . TOOTH EXTRACTION     Family History  Problem Relation Age of Onset  . Cancer Mother     Lung  . Heart disease Mother   . Cancer Father     Lung with mets to brain  . Cancer Sister     Lung   Social History   Occupational History  . Not on file.   Social History Main Topics  . Smoking status: Former Smoker    Packs/day: 2.00    Years: 20.00    Types: Cigars    Quit date: 01/10/2017  . Smokeless tobacco: Never Used  . Alcohol use 0.0 oz/week     Comment: occassionally on social occassions  . Drug use: No  . Sexual activity: Yes   Tobacco  Counseling Counseling given: No   Activities of Daily Living In your present state of health, do you have any difficulty performing the following activities: 01/18/2017  Hearing? N  Vision? N  Difficulty concentrating or making decisions? N  Walking or climbing stairs? N  Dressing or bathing? N  Doing errands, shopping? N  Preparing Food and eating ? N  Using the Toilet? N  In the past six months, have you accidently leaked urine? N  Do you have problems with loss of bowel control? N  Managing your Medications? N  Managing your Finances? N  Housekeeping or managing your Housekeeping? N  Some recent data might be hidden    Immunizations and Health Maintenance Immunization History  Administered Date(s) Administered  . Influenza,inj,Quad PF,36+ Mos 09/16/2014,  08/11/2015   There are no preventive care reminders to display for this patient.  Patient Care Team: Pleas Koch, NP as PCP - General (Internal Medicine)     Assessment:   This is a routine wellness examination for Don Christian.   Hearing/Vision screen  Hearing Screening   125Hz  250Hz  500Hz  1000Hz  2000Hz  3000Hz  4000Hz  6000Hz  8000Hz   Right ear:   40 40 40  40    Left ear:   40 40 40  0      Visual Acuity Screening   Right eye Left eye Both eyes  Without correction: 20/40 20/40 20/25   With correction:       Dietary issues and exercise activities discussed: Current Exercise Habits: Home exercise routine, Type of exercise: walking, Time (Minutes): 30, Frequency (Times/Week): 7, Weekly Exercise (Minutes/Week): 210, Intensity: Moderate, Exercise limited by: None identified  Goals    . Increase physical activity          Starting 01/18/2017, I will continue to walk at least 30 min daily.       Depression Screen PHQ 2/9 Scores 01/18/2017 01/27/2016  PHQ - 2 Score 0 4  PHQ- 9 Score - 10    Fall Risk Fall Risk  01/18/2017  Falls in the past year? No    Cognitive Function: MMSE - Mini Mental State Exam 01/18/2017  Orientation to time 5  Orientation to Place 5  Registration 3  Attention/ Calculation 0  Recall 3  Language- name 2 objects 0  Language- repeat 1  Language- follow 3 step command 3  Language- read & follow direction 0  Write a sentence 0  Copy design 0  Total score 20     PLEASE NOTE: A Mini-Cog screen was completed. Maximum score is 20. A value of 0 denotes this part of Folstein MMSE was not completed or the patient failed this part of the Mini-Cog screening.   Mini-Cog Screening Orientation to Time - Max 5 pts Orientation to Place - Max 5 pts Registration - Max 3 pts Recall - Max 3 pts Language Repeat - Max 1 pts Language Follow 3 Step Command - Max 3 pts     Screening Tests Health Maintenance  Topic Date Due  . COLONOSCOPY  10/03/2017 (Originally  10/04/2016)  . INFLUENZA VACCINE  05/04/2017  . TETANUS/TDAP  10/05/2019  . Hepatitis C Screening  Completed  . HIV Screening  Completed        Plan:     I have personally reviewed and addressed the Medicare Annual Wellness questionnaire and have noted the following in the patient's chart:  A. Medical and social history B. Use of alcohol, tobacco or illicit drugs  C. Current medications and supplements D.  Functional ability and status E.  Nutritional status F.  Physical activity G. Advance directives H. List of other physicians I.  Hospitalizations, surgeries, and ER visits in previous 12 months J.  Daisy to include hearing, vision, cognitive, depression L. Referrals and appointments - none  In addition, I have reviewed and discussed with patient certain preventive protocols, quality metrics, and best practice recommendations. A written personalized care plan for preventive services as well as general preventive health recommendations were provided to patient.  See attached scanned questionnaire for additional information.   Signed,   Lindell Noe, MHA, BS, LPN Health Coach'

## 2017-01-18 NOTE — Progress Notes (Signed)
Pre visit review using our clinic review tool, if applicable. No additional management support is needed unless otherwise documented below in the visit note. 

## 2017-01-18 NOTE — Patient Instructions (Signed)
Don Christian , Thank you for taking time to come for your Medicare Wellness Visit. I appreciate your ongoing commitment to your health goals. Please review the following plan we discussed and let me know if I can assist you in the future.   These are the goals we discussed: Goals    . Increase physical activity          Starting 01/18/2017, I will continue to walk at least 30 min daily.        This is a list of the screening recommended for you and due dates:  Health Maintenance  Topic Date Due  . Colon Cancer Screening  10/03/2017*  . Flu Shot  05/04/2017  . Tetanus Vaccine  10/05/2019  .  Hepatitis C: One time screening is recommended by Center for Disease Control  (CDC) for  adults born from [birth year] through 1966.   Completed  . HIV Screening  Completed  *Topic was postponed. The date shown is not the original due date.   Preventive Care for Adults  A healthy lifestyle and preventive care can promote health and wellness. Preventive health guidelines for adults include the following key practices.  . A routine yearly physical is a good way to check with your health care provider about your health and preventive screening. It is a chance to share any concerns and updates on your health and to receive a thorough exam.  . Visit your dentist for a routine exam and preventive care every 6 months. Brush your teeth twice a day and floss once a day. Good oral hygiene prevents tooth decay and gum disease.  . The frequency of eye exams is based on your age, health, family medical history, use  of contact lenses, and other factors. Follow your health care provider's ecommendations for frequency of eye exams.  . Eat a healthy diet. Foods like vegetables, fruits, whole grains, low-fat dairy products, and lean protein foods contain the nutrients you need without too many calories. Decrease your intake of foods high in solid fats, added sugars, and salt. Eat the right amount of calories for you. Get  information about a proper diet from your health care provider, if necessary.  . Regular physical exercise is one of the most important things you can do for your health. Most adults should get at least 150 minutes of moderate-intensity exercise (any activity that increases your heart rate and causes you to sweat) each week. In addition, most adults need muscle-strengthening exercises on 2 or more days a week.  Silver Sneakers may be a benefit available to you. To determine eligibility, you may visit the website: www.silversneakers.com or contact program at 929-537-0105 Mon-Fri between 8AM-8PM.   . Maintain a healthy weight. The body mass index (BMI) is a screening tool to identify possible weight problems. It provides an estimate of body fat based on height and weight. Your health care provider can find your BMI and can help you achieve or maintain a healthy weight.   For adults 20 years and older: ? A BMI below 18.5 is considered underweight. ? A BMI of 18.5 to 24.9 is normal. ? A BMI of 25 to 29.9 is considered overweight. ? A BMI of 30 and above is considered obese.   . Maintain normal blood lipids and cholesterol levels by exercising and minimizing your intake of saturated fat. Eat a balanced diet with plenty of fruit and vegetables. Blood tests for lipids and cholesterol should begin at age 55 and be repeated every  5 years. If your lipid or cholesterol levels are high, you are over 50, or you are at high risk for heart disease, you may need your cholesterol levels checked more frequently. Ongoing high lipid and cholesterol levels should be treated with medicines if diet and exercise are not working.  . If you smoke, find out from your health care provider how to quit. If you do not use tobacco, please do not start.  . If you choose to drink alcohol, please do not consume more than 2 drinks per day. One drink is considered to be 12 ounces (355 mL) of beer, 5 ounces (148 mL) of wine, or 1.5  ounces (44 mL) of liquor.  . If you are 67-75 years old, ask your health care provider if you should take aspirin to prevent strokes.  . Use sunscreen. Apply sunscreen liberally and repeatedly throughout the day. You should seek shade when your shadow is shorter than you. Protect yourself by wearing long sleeves, pants, a wide-brimmed hat, and sunglasses year round, whenever you are outdoors.  . Once a month, do a whole body skin exam, using a mirror to look at the skin on your back. Tell your health care provider of new moles, moles that have irregular borders, moles that are larger than a pencil eraser, or moles that have changed in shape or color.

## 2017-01-18 NOTE — Progress Notes (Signed)
PCP notes:   Health maintenance:  HIV screening - completed  Colon cancer screening - pt will discuss with PCP at next appt  Abnormal screenings:   Hearing - failed  Patient concerns:   Back pain - 3/10.   Numbness to face as a result of shingles.   Nurse concerns:  None  Next PCP appt:   01/27/17 @ 0730

## 2017-01-19 LAB — HIV ANTIBODY (ROUTINE TESTING W REFLEX): HIV: NONREACTIVE

## 2017-01-20 ENCOUNTER — Other Ambulatory Visit: Payer: Managed Care, Other (non HMO)

## 2017-01-20 NOTE — Progress Notes (Signed)
I reviewed health advisor's note, was available for consultation, and agree with documentation and plan.  

## 2017-01-24 ENCOUNTER — Other Ambulatory Visit: Payer: Self-pay | Admitting: Primary Care

## 2017-01-24 DIAGNOSIS — G4700 Insomnia, unspecified: Secondary | ICD-10-CM

## 2017-01-24 NOTE — Telephone Encounter (Signed)
Ok to refill? Electronically refill request for traZODone (DESYREL) 100 MG tablet. Last prescribed on 01/27/2016. Patient saw Katha Cabal on 01/18/2017. Patient is schedule for part 2 on 01/27/2017.

## 2017-01-27 ENCOUNTER — Encounter: Payer: Self-pay | Admitting: Primary Care

## 2017-01-27 ENCOUNTER — Other Ambulatory Visit: Payer: Self-pay | Admitting: Primary Care

## 2017-01-27 ENCOUNTER — Ambulatory Visit (INDEPENDENT_AMBULATORY_CARE_PROVIDER_SITE_OTHER): Payer: Managed Care, Other (non HMO) | Admitting: Primary Care

## 2017-01-27 VITALS — BP 120/78 | HR 77 | Temp 97.9°F | Ht 72.0 in | Wt 151.4 lb

## 2017-01-27 DIAGNOSIS — E875 Hyperkalemia: Secondary | ICD-10-CM

## 2017-01-27 DIAGNOSIS — G4700 Insomnia, unspecified: Secondary | ICD-10-CM | POA: Diagnosis not present

## 2017-01-27 DIAGNOSIS — R7303 Prediabetes: Secondary | ICD-10-CM | POA: Diagnosis not present

## 2017-01-27 DIAGNOSIS — J439 Emphysema, unspecified: Secondary | ICD-10-CM | POA: Diagnosis not present

## 2017-01-27 DIAGNOSIS — F3342 Major depressive disorder, recurrent, in full remission: Secondary | ICD-10-CM | POA: Diagnosis not present

## 2017-01-27 DIAGNOSIS — B029 Zoster without complications: Secondary | ICD-10-CM | POA: Diagnosis not present

## 2017-01-27 DIAGNOSIS — K469 Unspecified abdominal hernia without obstruction or gangrene: Secondary | ICD-10-CM | POA: Diagnosis not present

## 2017-01-27 DIAGNOSIS — Z23 Encounter for immunization: Secondary | ICD-10-CM | POA: Diagnosis not present

## 2017-01-27 DIAGNOSIS — E785 Hyperlipidemia, unspecified: Secondary | ICD-10-CM | POA: Insufficient documentation

## 2017-01-27 DIAGNOSIS — Z1211 Encounter for screening for malignant neoplasm of colon: Secondary | ICD-10-CM | POA: Diagnosis not present

## 2017-01-27 DIAGNOSIS — Z0000 Encounter for general adult medical examination without abnormal findings: Secondary | ICD-10-CM | POA: Diagnosis not present

## 2017-01-27 DIAGNOSIS — F17200 Nicotine dependence, unspecified, uncomplicated: Secondary | ICD-10-CM

## 2017-01-27 DIAGNOSIS — E782 Mixed hyperlipidemia: Secondary | ICD-10-CM

## 2017-01-27 DIAGNOSIS — B019 Varicella without complication: Secondary | ICD-10-CM

## 2017-01-27 LAB — BASIC METABOLIC PANEL
BUN: 11 mg/dL (ref 6–23)
CO2: 30 mEq/L (ref 19–32)
CREATININE: 1 mg/dL (ref 0.40–1.50)
Calcium: 9.7 mg/dL (ref 8.4–10.5)
Chloride: 102 mEq/L (ref 96–112)
GFR: 80.49 mL/min (ref >60.00)
GLUCOSE: 110 mg/dL — AB (ref 70–99)
Potassium: 4.5 mEq/L (ref 3.5–5.1)
Sodium: 136 mEq/L (ref 135–145)

## 2017-01-27 MED ORDER — ZOSTER VAC RECOMB ADJUVANTED 50 MCG/0.5ML IM SUSR: INTRAMUSCULAR | 1 refills | Status: DC

## 2017-01-27 MED ORDER — ALBUTEROL SULFATE HFA 108 (90 BASE) MCG/ACT IN AERS: 2.0000 | INHALATION_SPRAY | RESPIRATORY_TRACT | 2 refills | Status: DC | PRN

## 2017-01-27 NOTE — Assessment & Plan Note (Signed)
Doing well on Flomax, continue same. 

## 2017-01-27 NOTE — Assessment & Plan Note (Signed)
Rx for Shingrix prescription provided. He will check on price. Some residual numbness, overall doing well.

## 2017-01-27 NOTE — Assessment & Plan Note (Signed)
Long discussion today regarding importance of quitting. He plans on quitting this weekend.

## 2017-01-27 NOTE — Assessment & Plan Note (Signed)
Rx for Shingrix provided. All other immunizations UTD. PSA UTD. Due for colonoscopy, orders placed. Due for repeat CT chest, he will complete this Summer. Discussed the importance of a healthy diet and regular exercise in order to reduce the risk of other medical diseases. Exam unremarkable. Labs with prediabetes and hyperlipidemia.  Follow up in 1 year.

## 2017-01-27 NOTE — Assessment & Plan Note (Signed)
TC borderline, LDL above goal. He would like to work in improving his diet and increasing exercise.  Recheck in 6 months, consider statin if no improvement, especially given smoking history.

## 2017-01-27 NOTE — Assessment & Plan Note (Signed)
Increase of A1C to 6.0 on recent labs. Discussed to reduce ice cream, juice, cookies. Discussed to increase exercise. Will recheck in 6 months.

## 2017-01-27 NOTE — Progress Notes (Signed)
Pre visit review using our clinic review tool, if applicable. No additional management support is needed unless otherwise documented below in the visit note. 

## 2017-01-27 NOTE — Assessment & Plan Note (Signed)
Stable on Wellbutrin XL 300 mg. Denies SI/HI.

## 2017-01-27 NOTE — Assessment & Plan Note (Signed)
Stable on Trazodone, using PRN.

## 2017-01-27 NOTE — Patient Instructions (Addendum)
Take the Shingrix vaccination to the pharmacy for administration. This is a two part series, complete the first injection now, then repeat in 2-6 months.  It's important to improve your diet by reducing consumption of ice cream, cookies, starchy foods, processed snack foods, sugary drinks. Increase consumption of fresh vegetables and fruits, whole grains, water.  Ensure you are drinking 64 ounces of water daily.  Continue exercising. You should be getting 150 minutes of moderate intensity exercise weekly.  Shortness of Breath/Wheezing/Cough: Use the albuterol inhaler. Inhale 2 puffs into the lungs every 4-6 hours as needed for wheezing and/or shortness of breath.   You will be contacted regarding your referral to General Surgery and for your colonoscopy.  Please let us know if you have not heard back within one week.   Complete lab work prior to leaving today. I will notify you of your results once received.   Schedule a lab only appointment in 6 months to recheck your cholesterol and blood sugar levels.  Follow up in 1 year for your annual exam, or sooner if needed.  It was a pleasure to see you today!

## 2017-01-27 NOTE — Progress Notes (Signed)
Subjective:    Patient ID: Don Christian, male    DOB: 01-21-1956, 61 y.o.   MRN: [id number]  HPI  Don Christian is a 61 year old male who presents today for MVW Part 2. He was seen by our health advisor last week for his initial Medicare Annual Wellness Visit.   He is due for his colon cancer screening in 2018. He is agreeable to have this repeated.   Diet currently consists of:  Breakfast: Cereal with craisins, fruit Lunch: Left overs Dinner: Meat (grilled), vegetable, salad, corn,  Snacks: Fruit Desserts: Ice cream daily, cookies Beverages: Coffee, water, grape juice some days  Exercise: He walks 1 mile most days of the week.   1) BPH: Currently managed on Flomax. He denies any difficulty urinating, hematuria. He feels well managed on this regimen.   2) Depression: Currently managed on Wellbutrin XL 300 mg. Feels well managed on this regimen. He denies SI/HI.  3) COPD/Emphysema: Long history of tobacco abuse, has quit and restarted off and on for years. Emphysema/COPD noted on CT chest from 03/2016. He is due for repeat CT chest for lung cancer screening this Summer. He does experience shortness of breath with moderate exertion such as working in the yard, walking up stairs, any acute illness. This is a change from prior years. He does not use an inhaler.   4) Hernia: Diagnosed in 2015. Located to the left groin/lower abdomen that has been present since 2015 after moving heavy objects. Over the past 6 months he's experiencing increased pain with any sort of lifting, walking more than 1/2 mile, coughing. He would like to have this evaluated by a surgeon.   Review of Systems  Constitutional: Negative for unexpected weight change.  HENT: Negative for rhinorrhea.   Respiratory: Negative for cough.        Intermittent shortness of breath  Cardiovascular: Negative for chest pain.  Gastrointestinal: Positive for abdominal pain. Negative for constipation, diarrhea and vomiting.    Genitourinary: Negative for difficulty urinating.  Musculoskeletal: Negative for arthralgias and myalgias.  Skin: Negative for rash.  Allergic/Immunologic: Positive for environmental allergies.  Neurological: Negative for dizziness, numbness and headaches.       Intermittent residual numbness to right cheek from Shingles last February 2017  Psychiatric/Behavioral:       Feels well managed.       Past Medical History:  Diagnosis Date  . BPH (benign prostatic hyperplasia) 09/16/2014  . Cataract    left eye  . Chronic back pain 09/16/2014  . Colon polyps   . COPD (chronic obstructive pulmonary disease) (HCC)    mild  . DDD (degenerative disc disease), lumbar 09/16/2014  . Full dentures   . Inguinal hernia 2004  . Insomnia 09/16/2014  . Personal history of tobacco use, presenting hazards to health 03/10/2016  . Prostate disorder   . Sleeping difficulties   . Tobacco use disorder 09/16/2014     Social History   Social History  . Marital status: Married    Spouse name: N/A  . Number of children: N/A  . Years of education: N/A   Occupational History  . Not on file.   Social History Main Topics  . Smoking status: Former Smoker    Packs/day: 2.00    Years: 20.00    Types: Cigars    Quit date: 01/10/2017  . Smokeless tobacco: Never Used  . Alcohol use 0.0 oz/week     Comment: occassionally on social occassions  . Drug use:  No  . Sexual activity: Yes   Other Topics Concern  . Not on file   Social History Narrative   ** Merged History Encounter **       Work or School: no work currently, disabled from back injury in 2012 Home Situation: lives with wife Spiritual Beliefs: none Lifestyle: walks dog 2x per day;  Healthy diet per his report. Enjoys spending time outdoors.       Past Surgical History:  Procedure Laterality Date  . ABDOMINAL EXPOSURE N/A 05/09/2015   Procedure: ABDOMINAL EXPOSURE;  Surgeon: Rosetta Posner, MD;  Location: [facility];  Service:  Vascular;  Laterality: N/A;  . ANTERIOR LUMBAR FUSION N/A 05/09/2015   Procedure: LUMBAR FOUR-FIVE ANTERIOR LUMBAR FUSION WITH ABDOMINAL EXPOSURE;  Surgeon: Eustace Moore, MD;  Location: [facility];  Service: Neurosurgery;  Laterality: N/A;  . COLONOSCOPY W/ BIOPSIES AND POLYPECTOMY    . GANGLION CYST EXCISION     left leg  . HERNIA REPAIR    . TOOTH EXTRACTION      Family History  Problem Relation Age of Onset  . Cancer Mother     Lung  . Heart disease Mother   . Cancer Father     Lung with mets to brain  . Cancer Sister     Lung    No Known Allergies  Current Outpatient Prescriptions on File Prior to Visit  Medication Sig Dispense Refill  . buPROPion (WELLBUTRIN XL) 300 MG 24 hr tablet TAKE 1 TABLET(300 MG) BY MOUTH DAILY 90 tablet 0  . tamsulosin (FLOMAX) 0.4 MG CAPS capsule Take 1 capsule (0.4 mg total) by mouth daily. 90 capsule 3  . traZODone (DESYREL) 100 MG tablet Take 1 tablet (100 mg total) by mouth at bedtime as needed for sleep. 30 tablet 0   No current facility-administered medications on file prior to visit.     BP 120/78   Pulse 77   Temp 97.9 F (36.6 C) (Oral)   Ht 6' (1.829 m)   Wt 151 lb 6.4 oz (68.7 kg)   SpO2 98%   BMI 20.53 kg/m    Objective:   Physical Exam  Constitutional: He is oriented to person, place, and time. He appears well-nourished.  HENT:  Right Ear: Tympanic membrane and ear canal normal.  Left Ear: Tympanic membrane and ear canal normal.  Nose: Nose normal. Right sinus exhibits no maxillary sinus tenderness and no frontal sinus tenderness. Left sinus exhibits no maxillary sinus tenderness and no frontal sinus tenderness.  Mouth/Throat: Oropharynx is clear and moist.  Eyes: Conjunctivae and EOM are normal. Pupils are equal, round, and reactive to light.  Neck: Neck supple. Carotid bruit is not present. No thyromegaly present.  Cardiovascular: Normal rate, regular rhythm and normal heart sounds.   Pulmonary/Chest: Effort normal  and breath sounds normal. He has no wheezes. He has no rales.  Abdominal: Soft. Bowel sounds are normal. There is no tenderness.  Musculoskeletal: Normal range of motion.  Neurological: He is alert and oriented to person, place, and time. He has normal reflexes. No cranial nerve deficit.  Skin: Skin is warm and dry.  Psychiatric: He has a normal mood and affect.          Assessment & Plan:

## 2017-02-07 ENCOUNTER — Encounter: Payer: Self-pay | Admitting: General Surgery

## 2017-02-14 ENCOUNTER — Telehealth: Payer: Self-pay

## 2017-02-14 ENCOUNTER — Other Ambulatory Visit: Payer: Self-pay

## 2017-02-14 DIAGNOSIS — Z1211 Encounter for screening for malignant neoplasm of colon: Secondary | ICD-10-CM

## 2017-02-14 NOTE — Telephone Encounter (Signed)
Gastroenterology Pre-Procedure Review  Request Date: 6/15 Requesting Physician: Dr. Governor Specking  PATIENT REVIEW QUESTIONS: The patient responded to the following health history questions as indicated:    1. Are you having any GI issues? no 2. Do you have a personal history of Polyps? yes (hyperplastic) 3. Do you have a family history of Colon Cancer or Polyps? no 4. Diabetes Mellitus? no 5. Joint replacements in the past 12 months?no 6. Major health problems in the past 3 months?no 7. Any artificial heart valves, MVP, or defibrillator?no    MEDICATIONS & ALLERGIES:    Patient reports the following regarding taking any anticoagulation/antiplatelet therapy:   Plavix, Coumadin, Eliquis, Xarelto, Lovenox, Pradaxa, Brilinta, or Effient? no Aspirin? yes (81mg )  Patient confirms/reports the following medications:  Current Outpatient Prescriptions  Medication Sig Dispense Refill  . albuterol (PROVENTIL HFA;VENTOLIN HFA) 108 (90 Base) MCG/ACT inhaler Inhale 2 puffs into the lungs every 4 (four) hours as needed for wheezing or shortness of breath. 1 Inhaler 2  . buPROPion (WELLBUTRIN XL) 300 MG 24 hr tablet TAKE 1 TABLET(300 MG) BY MOUTH DAILY 90 tablet 0  . tamsulosin (FLOMAX) 0.4 MG CAPS capsule Take 1 capsule (0.4 mg total) by mouth daily. 90 capsule 3  . traZODone (DESYREL) 100 MG tablet Take 1 tablet (100 mg total) by mouth at bedtime as needed for sleep. 30 tablet 0  . Zoster Vac Recomb Adjuvanted [facility]) injection Administer once to the deltoid muscle. Repeat in 2-6 months. 0.5 mL 1   No current facility-administered medications for this visit.     Patient confirms/reports the following allergies:  No Known Allergies  No orders of the defined types were placed in this encounter.   AUTHORIZATION INFORMATION Primary Insurance: 1D#: Group #:  Secondary Insurance: 1D#: Group #:  SCHEDULE INFORMATION: Date: 6/15 Time: Location: [facility]

## 2017-02-15 ENCOUNTER — Telehealth: Payer: Self-pay | Admitting: Gastroenterology

## 2017-02-15 ENCOUNTER — Encounter: Payer: Self-pay | Admitting: General Surgery

## 2017-02-15 ENCOUNTER — Ambulatory Visit (INDEPENDENT_AMBULATORY_CARE_PROVIDER_SITE_OTHER): Payer: Medicare Other | Admitting: General Surgery

## 2017-02-15 VITALS — BP 120/70 | HR 68 | Resp 12 | Ht 71.0 in | Wt 147.0 lb

## 2017-02-15 DIAGNOSIS — R109 Unspecified abdominal pain: Secondary | ICD-10-CM | POA: Diagnosis not present

## 2017-02-15 NOTE — Progress Notes (Signed)
Patient ID: Don Christian, male   DOB: 02/05/1956, 61 y.o.   MRN: [id number]  Chief Complaint  Patient presents with  . Other    HPI Don Christian is a 61 y.o. male here today for a evaluation of a abdominal hernia. Marland Kitchen He states he was moving an ice chest when he noticed this area in 2015. No change in size. Pain when he moves or with activity. The pain last for three to four minutes. He states he was moving an ice chest when he noticed this area in 2015. Takes aspirin for pain. Move his bowels every other day.   HPI  Past Medical History:  Diagnosis Date  . BPH (benign prostatic hyperplasia) 09/16/2014  . Cataract    left eye  . Chronic back pain 09/16/2014  . Colon polyps   . COPD (chronic obstructive pulmonary disease) (HCC)    mild  . DDD (degenerative disc disease), lumbar 09/16/2014  . Full dentures   . Inguinal hernia 2004  . Insomnia 09/16/2014  . Personal history of tobacco use, presenting hazards to health 03/10/2016  . Prostate disorder   . Sleeping difficulties   . Tobacco use disorder 09/16/2014    Past Surgical History:  Procedure Laterality Date  . ABDOMINAL EXPOSURE N/A 05/09/2015   Procedure: ABDOMINAL EXPOSURE;  Surgeon: Rosetta Posner, MD;  Location: [place] [facility];  Service: Vascular;  Laterality: N/A;  . ANTERIOR LUMBAR FUSION N/A 05/09/2015   Procedure: LUMBAR FOUR-FIVE ANTERIOR LUMBAR FUSION WITH ABDOMINAL EXPOSURE;  Surgeon: Eustace Moore, MD;  Location: [place] [facility];  Service: Neurosurgery;  Laterality: N/A;  . COLONOSCOPY W/ BIOPSIES AND POLYPECTOMY    . GANGLION CYST EXCISION     left leg  . HERNIA REPAIR Left    inguinal at 14   . TOOTH EXTRACTION      Family History  Problem Relation Age of Onset  . Cancer Mother        Lung  . Heart disease Mother   . Cancer Father        Lung with mets to brain  . Cancer Sister        Lung    Social History Social History  Substance Use Topics  . Smoking status: Current Every Day Smoker    Years: 20.00   Types: Cigars    Last attempt to quit: 01/10/2017  . Smokeless tobacco: Never Used     Comment: 2 cigars daily   . Alcohol use 0.0 oz/week     Comment: occassionally on social occassions    No Known Allergies  Current Outpatient Prescriptions  Medication Sig Dispense Refill  . albuterol (PROVENTIL HFA;VENTOLIN HFA) 108 (90 Base) MCG/ACT inhaler Inhale 2 puffs into the lungs every 4 (four) hours as needed for wheezing or shortness of breath. 1 Inhaler 2  . buPROPion (WELLBUTRIN XL) 300 MG 24 hr tablet TAKE 1 TABLET(300 MG) BY MOUTH DAILY 90 tablet 0  . tamsulosin (FLOMAX) 0.4 MG CAPS capsule Take 1 capsule (0.4 mg total) by mouth daily. 90 capsule 3  . traZODone (DESYREL) 100 MG tablet Take 1 tablet (100 mg total) by mouth at bedtime as needed for sleep. 30 tablet 0  . Zoster Vac Recomb Adjuvanted [facility]) injection Administer once to the deltoid muscle. Repeat in 2-6 months. 0.5 mL 1   No current facility-administered medications for this visit.     Review of Systems Review of Systems  Constitutional: Negative.   Respiratory: Negative.   Cardiovascular: Negative.  Blood pressure 120/70, pulse 68, resp. rate 12, height 5\' 11"  (1.803 m), weight 147 lb (66.7 kg).  Physical Exam Physical Exam  Constitutional: He is oriented to person, place, and time. He appears well-developed and well-nourished.  Eyes: Conjunctivae are normal. No scleral icterus.  Neck: Neck supple.  Cardiovascular: Normal rate, regular rhythm and normal heart sounds.   Pulmonary/Chest: Effort normal and breath sounds normal.  Abdominal: Soft. Normal appearance and bowel sounds are normal. There is no hepatomegaly. No hernia.    Lymphadenopathy:    He has no cervical adenopathy.  Neurological: He is alert and oriented to person, place, and time.  Skin: Skin is warm and dry.    Data Reviewed Operative report of 05/09/2015 describing anterior approach to the L 4-5 disc area.  Assessment    No evidence  of abdominal wall hernia.    Plan  Likely chronic irritation from dissection as there is no evidence of hernia formation.    The patient is aware to use an antiinflammatory of choice (Advil or Aleve) as needed for comfort. Patient to return as needed.  HPI, Physical Exam, Assessment and Plan have been scribed under the direction and in the presence of Hervey Ard, MD.  Don Christian, CMA  I have completed the exam and reviewed the above documentation for accuracy and completeness.  I agree with the above.  Haematologist has been used and any errors in dictation or transcription are unintentional.  Hervey Ard, M.D., F.A.C.S.   Robert Bellow 02/16/2017, 7:07 AM

## 2017-02-15 NOTE — Telephone Encounter (Signed)
02/15/17 Faxed Prior Auth form to [place].

## 2017-02-15 NOTE — Patient Instructions (Signed)
Return as needed.The patient is aware to call back for any questions or concerns.  

## 2017-02-16 DIAGNOSIS — R109 Unspecified abdominal pain: Secondary | ICD-10-CM | POA: Insufficient documentation

## 2017-03-08 ENCOUNTER — Telehealth: Payer: Self-pay | Admitting: *Deleted

## 2017-03-08 DIAGNOSIS — Z87891 Personal history of nicotine dependence: Secondary | ICD-10-CM

## 2017-03-08 NOTE — Telephone Encounter (Signed)
Notified patient that annual lung cancer screening low dose CT scan is due currently or will be in near future. Confirmed that patient is within the age range of 55-77, and asymptomatic, (no signs or symptoms of lung cancer). Patient denies illness that would prevent curative treatment for lung cancer if found. Verified smoking history, (current, 37 pack year). The shared decision making visit was done 01/31/15. Patient is agreeable for CT scan being scheduled.

## 2017-03-08 NOTE — Telephone Encounter (Signed)
Left message for patient to notify them that it is time to schedule annual low dose lung cancer screening CT scan. Instructed patient to call back to verify information prior to the scan being scheduled.  

## 2017-03-18 ENCOUNTER — Ambulatory Visit
Admission: RE | Admit: 2017-03-18 | Discharge: 2017-03-18 | Disposition: A | Discharge status: Home / Self Care | Payer: Managed Care, Other (non HMO) | Source: Ambulatory Visit | Attending: Gastroenterology | Admitting: Gastroenterology

## 2017-03-18 ENCOUNTER — Ambulatory Visit: Payer: Managed Care, Other (non HMO) | Admitting: Anesthesiology

## 2017-03-18 ENCOUNTER — Encounter
Admission: RE | Disposition: A | Discharge status: Home / Self Care | Payer: Self-pay | Source: Ambulatory Visit | Attending: Gastroenterology

## 2017-03-18 ENCOUNTER — Encounter: Payer: Self-pay | Admitting: *Deleted

## 2017-03-18 DIAGNOSIS — Z1211 Encounter for screening for malignant neoplasm of colon: Secondary | ICD-10-CM

## 2017-03-18 DIAGNOSIS — D123 Benign neoplasm of transverse colon: Secondary | ICD-10-CM | POA: Diagnosis not present

## 2017-03-18 DIAGNOSIS — L640 Drug-induced androgenic alopecia: Secondary | ICD-10-CM | POA: Diagnosis not present

## 2017-03-18 DIAGNOSIS — N400 Enlarged prostate without lower urinary tract symptoms: Secondary | ICD-10-CM | POA: Diagnosis not present

## 2017-03-18 DIAGNOSIS — K5730 Diverticulosis of large intestine without perforation or abscess without bleeding: Secondary | ICD-10-CM | POA: Insufficient documentation

## 2017-03-18 DIAGNOSIS — F329 Major depressive disorder, single episode, unspecified: Secondary | ICD-10-CM | POA: Insufficient documentation

## 2017-03-18 DIAGNOSIS — J449 Chronic obstructive pulmonary disease, unspecified: Secondary | ICD-10-CM | POA: Insufficient documentation

## 2017-03-18 DIAGNOSIS — K635 Polyp of colon: Secondary | ICD-10-CM | POA: Insufficient documentation

## 2017-03-18 DIAGNOSIS — F17290 Nicotine dependence, other tobacco product, uncomplicated: Secondary | ICD-10-CM | POA: Diagnosis not present

## 2017-03-18 DIAGNOSIS — Z79899 Other long term (current) drug therapy: Secondary | ICD-10-CM | POA: Insufficient documentation

## 2017-03-18 HISTORY — PX: COLONOSCOPY WITH PROPOFOL: SHX5780

## 2017-03-18 SURGERY — COLONOSCOPY WITH PROPOFOL
Anesthesia: General

## 2017-03-18 MED ORDER — PROPOFOL 10 MG/ML IV BOLUS
INTRAVENOUS | Status: DC | PRN
  Administered 2017-03-18: 70 mg via INTRAVENOUS
  Administered 2017-03-18: 10 mg via INTRAVENOUS
  Administered 2017-03-18: 30 mg via INTRAVENOUS
  Administered 2017-03-18: 10 mg via INTRAVENOUS

## 2017-03-18 MED ORDER — LIDOCAINE HCL (CARDIAC) 20 MG/ML IV SOLN
INTRAVENOUS | Status: DC | PRN
  Administered 2017-03-18: 40 mg via INTRAVENOUS

## 2017-03-18 MED ORDER — SODIUM CHLORIDE 0.9 % IV SOLN
INTRAVENOUS | Status: DC
  Administered 2017-03-18: 12:00:00 via INTRAVENOUS

## 2017-03-18 MED ORDER — PROPOFOL 500 MG/50ML IV EMUL
INTRAVENOUS | Status: DC | PRN
  Administered 2017-03-18: 150 ug/kg/min via INTRAVENOUS

## 2017-03-18 MED ORDER — PROPOFOL 500 MG/50ML IV EMUL
INTRAVENOUS | Status: AC
  Filled 2017-03-18: qty 50

## 2017-03-18 MED ORDER — MIDAZOLAM HCL 2 MG/2ML IJ SOLN
INTRAMUSCULAR | Status: AC
  Filled 2017-03-18: qty 2

## 2017-03-18 MED ORDER — MIDAZOLAM HCL 2 MG/2ML IJ SOLN
INTRAMUSCULAR | Status: DC | PRN
  Administered 2017-03-18: 1 mg via INTRAVENOUS

## 2017-03-18 NOTE — Anesthesia Preprocedure Evaluation (Signed)
Anesthesia Evaluation  Patient identified by MRN, date of birth, ID band Patient awake    Reviewed: Allergy & Precautions, NPO status , Patient's Chart, lab work & pertinent test results  History of Anesthesia Complications Negative for: history of anesthetic complications  Airway Mallampati: II  TM Distance: >3 FB Neck ROM: Full    Dental  (+) Dental Advisory Given, Upper Dentures, Lower Dentures   Pulmonary neg pulmonary ROS, COPD,  COPD inhaler, Current Smoker, former smoker,  Quit smoking 1 year ago; diagnosed with COPD "a long time ago" but does not use inhalers. Denies SOB, DOE.   Pulmonary exam normal breath sounds clear to auscultation       Cardiovascular Exercise Tolerance: Good negative cardio ROS Normal cardiovascular exam Rhythm:Regular Rate:Normal     Neuro/Psych PSYCHIATRIC DISORDERS Depression 64 pack year smoking. Quit 1 year agonegative neurological ROS     GI/Hepatic negative GI ROS, Neg liver ROS,   Endo/Other  negative endocrine ROS  Renal/GU negative Renal ROS   BPH on Flomax negative genitourinary   Musculoskeletal  (+) Arthritis , Osteoarthritis,  DDD   Abdominal   Peds negative pediatric ROS (+)  Hematology negative hematology ROS (+)   Anesthesia Other Findings   Reproductive/Obstetrics                             Anesthesia Physical  Anesthesia Plan  ASA: II  Anesthesia Plan: General   Post-op Pain Management:    Induction: Intravenous  PONV Risk Score and Plan:   Airway Management Planned: Nasal Cannula  Additional Equipment:   Intra-op Plan:   Post-operative Plan:   Informed Consent: I have reviewed the patients History and Physical, chart, labs and discussed the procedure including the risks, benefits and alternatives for the proposed anesthesia with the patient or authorized representative who has indicated his/her understanding and  acceptance.   Dental advisory given  Plan Discussed with: CRNA and Anesthesiologist  Anesthesia Plan Comments:         Anesthesia Quick Evaluation

## 2017-03-18 NOTE — Transfer of Care (Signed)
Immediate Anesthesia Transfer of Care Note  Patient: Don Christian  Procedure(s) Performed: Procedure(s): COLONOSCOPY WITH PROPOFOL (N/A)  Patient Location: PACU  Anesthesia Type:General  Level of Consciousness: awake, alert  and oriented  Airway & Oxygen Therapy: Patient Spontanous Breathing and Patient connected to nasal cannula oxygen  Post-op Assessment: Report given to RN and Post -op Vital signs reviewed and stable  Post vital signs: Reviewed and stable  Last Vitals:  Vitals:   03/18/17 1038 03/18/17 1236  BP: 121/79 92/68  Pulse: 87 85  Resp: 18 19  Temp: (!) 36.1 C 36.1 C    Last Pain:  Vitals:   03/18/17 1236  TempSrc: Tympanic         Complications: No apparent anesthesia complications

## 2017-03-18 NOTE — Op Note (Signed)
[facility] Patient Name: Johncarlo Maalouf Procedure Date: 03/18/2017 12:05 PM MRN: [id number] Account #: [id number] Date of Birth: 03/03/1956 Admit Type: Outpatient Age: 61 Room: [facility] ENDO ROOM 3 Gender: Male Note Status: Finalized Procedure:            Colonoscopy Indications:          Screening for colorectal malignant neoplasm Providers:            [name] MD, MD Referring MD:         Pleas Koch (Referring MD) Medicines:            Monitored Anesthesia Care Complications:        No immediate complications. Procedure:            Pre-Anesthesia Assessment:                       - Prior to the procedure, a History and Physical was                        performed, and patient medications, allergies and                        sensitivities were reviewed. The patient's tolerance of                        previous anesthesia was reviewed.                       - The risks and benefits of the procedure and the                        sedation options and risks were discussed with the                        patient. All questions were answered and informed                        consent was obtained.                       - ASA Grade Assessment: III - A patient with severe                        systemic disease.                       After obtaining informed consent, the colonoscope was                        passed under direct vision. Throughout the procedure,                        the patient's blood pressure, pulse, and oxygen                        saturations were monitored continuously. The                        Colonoscope was introduced through the anus and  advanced to the the cecum, identified by the                        appendiceal orifice, IC valve and transillumination.                        The quality of the bowel preparation was good. Findings:      The perianal and digital rectal examinations  were normal.      Non-bleeding internal hemorrhoids were found during retroflexion. The       hemorrhoids were large and Grade I (internal hemorrhoids that do not       prolapse).      Many medium-mouthed diverticula were found in the sigmoid colon.      The exam was otherwise without abnormality on direct and retroflexion       views.      A 5 mm polyp was found in the hepatic flexure. The polyp was sessile.       The polyp was removed with a cold biopsy forceps. Resection and       retrieval were complete. Impression:           - Non-bleeding internal hemorrhoids.                       - Moderate diverticulosis in the sigmoid colon.                       - The examination was otherwise normal on direct and                        retroflexion views.                       - No specimens collected. Recommendation:       - Discharge patient to home [Means].                       - Resume previous diet.                       - Continue present medications.                       - Await pathology results.                       - Repeat colonoscopy in 5-10 years for surveillance                        based on pathology results. Procedure Code(s):    --- Professional ---                       [phone], Colonoscopy, flexible; with biopsy, single or                        multiple Diagnosis Code(s):    --- Professional ---                       Z12.11, Encounter for screening for malignant neoplasm                        of colon  K64.0, First degree hemorrhoids                       K57.30, Diverticulosis of large intestine without                        perforation or abscess without bleeding CPT copyright 2016 American Medical Association. All rights reserved. The codes documented in this report are preliminary and upon coder review may  be revised to meet current compliance requirements. [name], MD [name] MD, MD 03/18/2017 12:36:24 PM This report has been  signed electronically. Number of Addenda: 0 Note Initiated On: 03/18/2017 12:05 PM Scope Withdrawal Time: 0 hours 8 minutes 26 seconds  Total Procedure Duration: 0 hours 23 minutes 0 seconds       [facility]

## 2017-03-18 NOTE — Anesthesia Post-op Follow-up Note (Cosign Needed)
Anesthesia QCDR form completed.        

## 2017-03-18 NOTE — H&P (Signed)
[name] MD [address] [place], [place] [address] Phone: [phone] Fax : [phone]  Primary Care Physician:  Pleas Koch, NP Primary Gastroenterologist:  Dr. [name]   Pre-Procedure History & Physical: HPI:  Don Christian is a 61 y.o. male is here for an colonoscopy.   Past Medical History:  Diagnosis Date  . BPH (benign prostatic hyperplasia) 09/16/2014  . Cataract    left eye  . Chronic back pain 09/16/2014  . Colon polyps   . COPD (chronic obstructive pulmonary disease) (HCC)    mild  . DDD (degenerative disc disease), lumbar 09/16/2014  . Full dentures   . Inguinal hernia 2004  . Insomnia 09/16/2014  . Personal history of tobacco use, presenting hazards to health 03/10/2016  . Prostate disorder   . Sleeping difficulties   . Tobacco use disorder 09/16/2014    Past Surgical History:  Procedure Laterality Date  . ABDOMINAL EXPOSURE N/A 05/09/2015   Procedure: ABDOMINAL EXPOSURE;  Surgeon: Rosetta Posner, MD;  Location: [facility];  Service: Vascular;  Laterality: N/A;  . ANTERIOR LUMBAR FUSION N/A 05/09/2015   Procedure: LUMBAR FOUR-FIVE ANTERIOR LUMBAR FUSION WITH ABDOMINAL EXPOSURE;  Surgeon: Eustace Moore, MD;  Location: [facility];  Service: Neurosurgery;  Laterality: N/A;  . COLONOSCOPY W/ BIOPSIES AND POLYPECTOMY    . GANGLION CYST EXCISION     left leg  . HERNIA REPAIR Left    inguinal at 14   . TOOTH EXTRACTION      Prior to Admission medications   Medication Sig Start Date End Date Taking? Authorizing Provider  albuterol (PROVENTIL HFA;VENTOLIN HFA) 108 (90 Base) MCG/ACT inhaler Inhale 2 puffs into the lungs every 4 (four) hours as needed for wheezing or shortness of breath. 01/27/17  Yes Pleas Koch, NP  buPROPion (WELLBUTRIN XL) 300 MG 24 hr tablet TAKE 1 TABLET(300 MG) BY MOUTH DAILY 12/13/16  Yes Pleas Koch, NP  tamsulosin (FLOMAX) 0.4 MG CAPS capsule Take 1 capsule (0.4 mg total) by mouth daily. 06/29/16  Yes  Pleas Koch, NP  traZODone (DESYREL) 100 MG tablet Take 1 tablet (100 mg total) by mouth at bedtime as needed for sleep. 01/24/17  Yes Pleas Koch, NP  Zoster Vac Recomb Adjuvanted [facility]) injection Administer once to the deltoid muscle. Repeat in 2-6 months. 01/27/17   Pleas Koch, NP    Allergies as of 02/14/2017  . (No Known Allergies)    Family History  Problem Relation Age of Onset  . Cancer Mother        Lung  . Heart disease Mother   . Cancer Father        Lung with mets to brain  . Cancer Sister        Lung    Social History   Social History  . Marital status: Married    Spouse name: N/A  . Number of children: N/A  . Years of education: N/A   Occupational History  . Not on file.   Social History Main Topics  . Smoking status: Current Every Day Smoker    Years: 20.00    Types: Cigars    Last attempt to quit: 01/10/2017  . Smokeless tobacco: Never Used     Comment: 2 cigars daily   . Alcohol use 0.0 oz/week     Comment: occassionally on social occassions  . Drug use: No  . Sexual activity: Yes   Other Topics Concern  . Not on  file   Social History Narrative   ** Merged History Encounter **       Work or School: no work currently, disabled from back injury in 2012 Home Situation: lives with wife Spiritual Beliefs: none Lifestyle: walks dog 2x per day;  Healthy diet per his report. Enjoys spending time outdoors.       Review of Systems: See HPI, otherwise negative ROS  Physical Exam: BP 121/79   Pulse 87   Temp (!) 96.9 F (36.1 C) (Tympanic)   Resp 18   Ht 5\' 11"  (1.803 m)   Wt 145 lb (65.8 kg)   SpO2 100%   BMI 20.22 kg/m  General:   Alert,  pleasant and cooperative in NAD Head:  Normocephalic and atraumatic. Neck:  Supple; no masses or thyromegaly. Lungs:  Clear throughout to auscultation.    Heart:  Regular rate and rhythm. Abdomen:  Soft, nontender and nondistended. Normal bowel sounds, without guarding, and  without rebound.   Neurologic:  Alert and  oriented x4;  grossly normal neurologically.  Impression/Plan: Don Christian is here for an colonoscopy to be performed for Screening colonoscopy average risk    Risks, benefits, limitations, and alternatives regarding  colonoscopy have been reviewed with the patient.  Questions have been answered.  All parties agreeable.   [name], MD  03/18/2017, 12:00 PM

## 2017-03-18 NOTE — Anesthesia Postprocedure Evaluation (Signed)
Anesthesia Post Note  Patient: Don Christian  Procedure(s) Performed: Procedure(s) (LRB): COLONOSCOPY WITH PROPOFOL (N/A)  Patient location during evaluation: PACU Anesthesia Type: General Level of consciousness: awake and alert and oriented Pain management: pain level controlled Vital Signs Assessment: post-procedure vital signs reviewed and stable Respiratory status: spontaneous breathing Cardiovascular status: blood pressure returned to baseline Anesthetic complications: no     Last Vitals:  Vitals:   03/18/17 1256 03/18/17 1306  BP: 113/77 107/78  Pulse: 83 72  Resp: 15 (!) 21  Temp:      Last Pain:  Vitals:   03/18/17 1237  TempSrc: Tympanic                 Don Christian

## 2017-03-21 ENCOUNTER — Encounter: Payer: Self-pay | Admitting: Gastroenterology

## 2017-03-21 LAB — SURGICAL PATHOLOGY

## 2017-03-22 ENCOUNTER — Ambulatory Visit
Admission: RE | Admit: 2017-03-22 | Discharge: 2017-03-22 | Disposition: A | Discharge status: Home / Self Care | Payer: Managed Care, Other (non HMO) | Source: Ambulatory Visit | Attending: Oncology | Admitting: Oncology

## 2017-03-22 DIAGNOSIS — I700 Atherosclerosis of aorta: Secondary | ICD-10-CM | POA: Diagnosis not present

## 2017-03-22 DIAGNOSIS — Z122 Encounter for screening for malignant neoplasm of respiratory organs: Secondary | ICD-10-CM | POA: Diagnosis present

## 2017-03-22 DIAGNOSIS — M47814 Spondylosis without myelopathy or radiculopathy, thoracic region: Secondary | ICD-10-CM | POA: Insufficient documentation

## 2017-03-22 DIAGNOSIS — J432 Centrilobular emphysema: Secondary | ICD-10-CM | POA: Insufficient documentation

## 2017-03-22 DIAGNOSIS — Z87891 Personal history of nicotine dependence: Secondary | ICD-10-CM | POA: Insufficient documentation

## 2017-03-22 DIAGNOSIS — J439 Emphysema, unspecified: Secondary | ICD-10-CM | POA: Insufficient documentation

## 2017-03-22 DIAGNOSIS — R918 Other nonspecific abnormal finding of lung field: Secondary | ICD-10-CM | POA: Diagnosis not present

## 2017-03-24 ENCOUNTER — Other Ambulatory Visit (INDEPENDENT_AMBULATORY_CARE_PROVIDER_SITE_OTHER): Payer: Managed Care, Other (non HMO)

## 2017-03-24 DIAGNOSIS — E875 Hyperkalemia: Secondary | ICD-10-CM

## 2017-03-24 DIAGNOSIS — R7303 Prediabetes: Secondary | ICD-10-CM

## 2017-03-24 DIAGNOSIS — E782 Mixed hyperlipidemia: Secondary | ICD-10-CM

## 2017-03-24 LAB — LIPID PANEL
CHOLESTEROL: 195 mg/dL (ref 0–200)
HDL: 50.9 mg/dL (ref >39.00)
LDL CALC: 133 mg/dL — AB (ref 0–99)
NonHDL: 143.72
TRIGLYCERIDES: 54 mg/dL (ref 0.0–149.0)
Total CHOL/HDL Ratio: 4
VLDL: 10.8 mg/dL (ref 0.0–40.0)

## 2017-03-24 LAB — BASIC METABOLIC PANEL
BUN: 15 mg/dL (ref 6–23)
CHLORIDE: 102 meq/L (ref 96–112)
CO2: 29 mEq/L (ref 19–32)
CREATININE: 0.97 mg/dL (ref 0.40–1.50)
Calcium: 9.6 mg/dL (ref 8.4–10.5)
GFR: 83.33 mL/min (ref >60.00)
Glucose, Bld: 107 mg/dL — ABNORMAL HIGH (ref 70–99)
POTASSIUM: 4.6 meq/L (ref 3.5–5.1)
Sodium: 135 mEq/L (ref 135–145)

## 2017-03-24 LAB — HEMOGLOBIN A1C: Hgb A1c MFr Bld: 5.9 % (ref 4.6–6.5)

## 2017-03-28 ENCOUNTER — Encounter: Payer: Self-pay | Admitting: *Deleted

## 2017-04-16 ENCOUNTER — Other Ambulatory Visit: Payer: Self-pay | Admitting: Primary Care

## 2017-04-16 DIAGNOSIS — F329 Major depressive disorder, single episode, unspecified: Secondary | ICD-10-CM

## 2017-04-16 DIAGNOSIS — F32A Depression, unspecified: Secondary | ICD-10-CM

## 2017-07-09 ENCOUNTER — Other Ambulatory Visit: Payer: Self-pay | Admitting: Primary Care

## 2017-07-09 DIAGNOSIS — G4700 Insomnia, unspecified: Secondary | ICD-10-CM

## 2017-07-14 IMAGING — CR DG CHEST 2V
2 series · 2 of 2 positions shown · non-contrast
Comparison: None.

CLINICAL DATA: Preoperative exam prior to lumbar fusion. COPD.
Previous smoker. Approximate 30 year pack history.

EXAM:
CHEST  2 VIEW

[w chest pa]
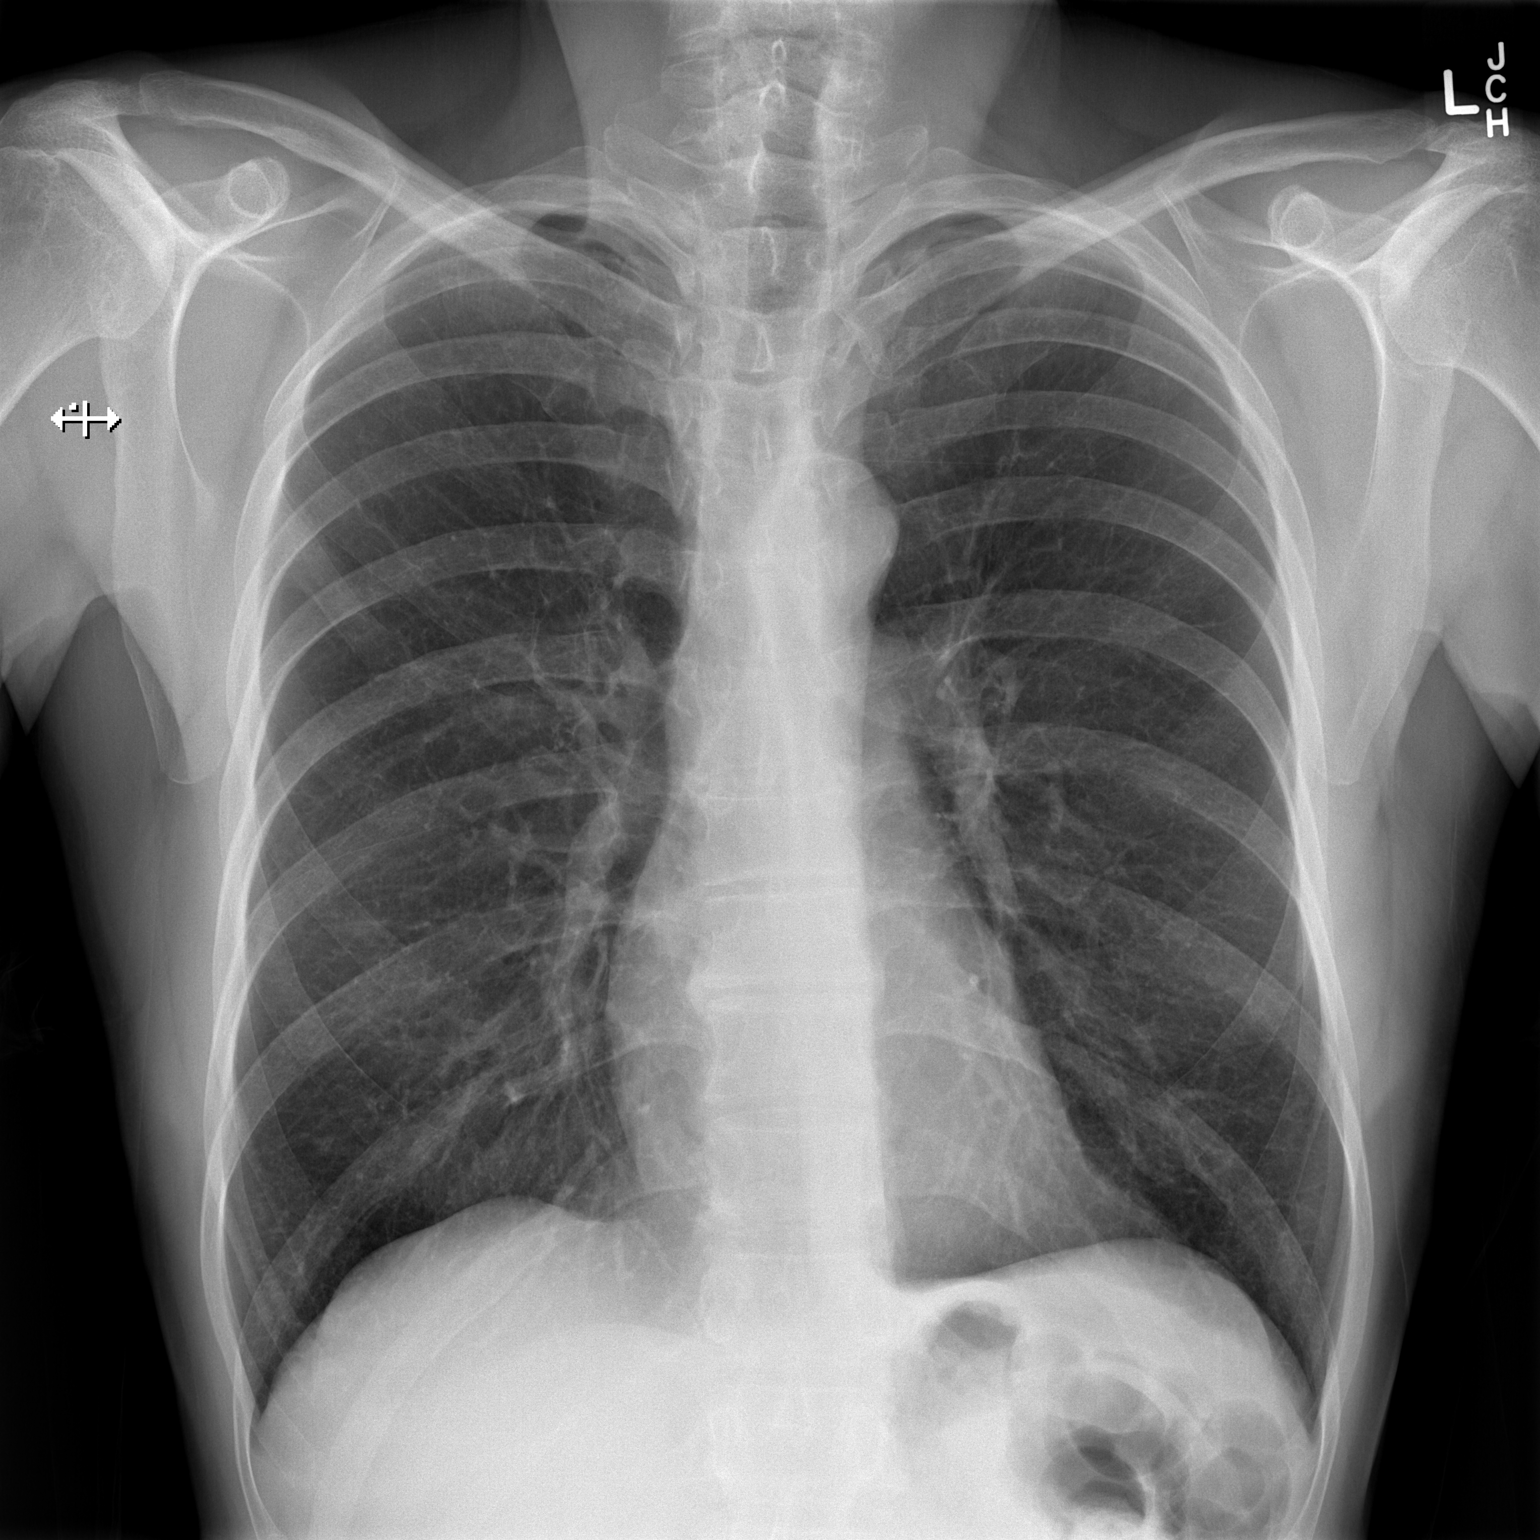

[w chest lat]
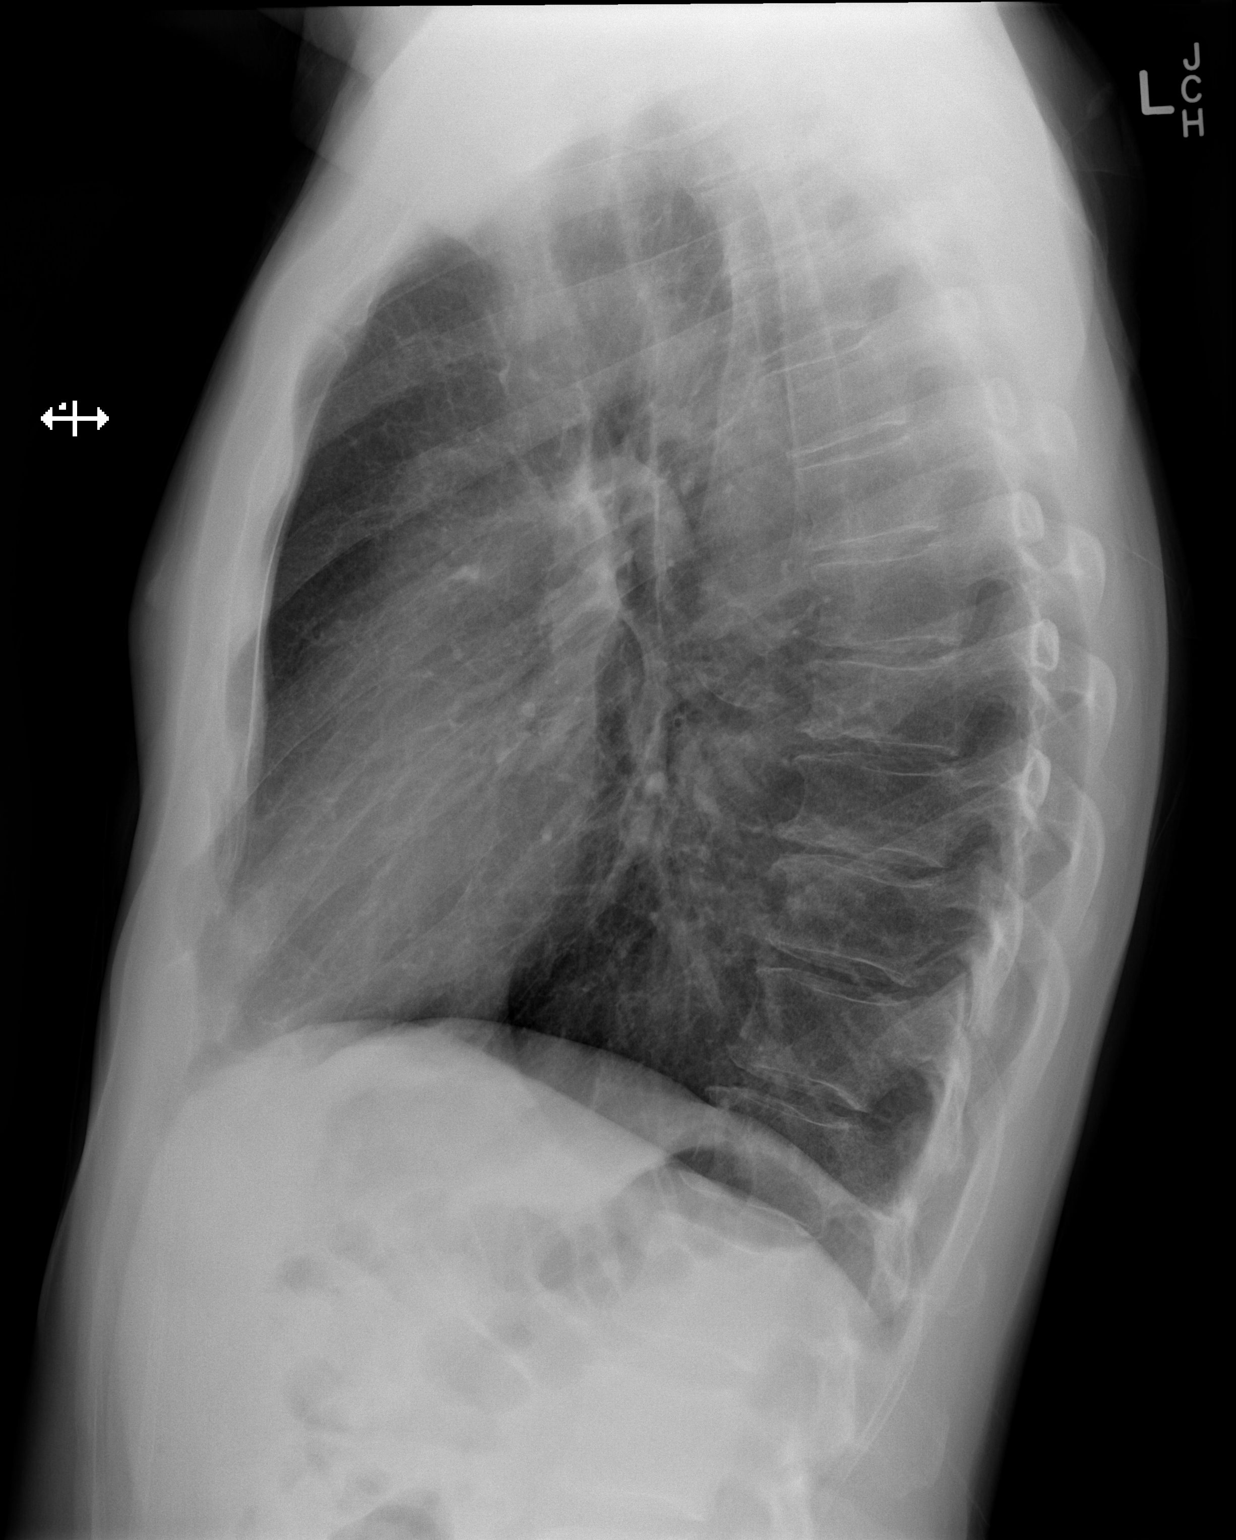

[2 of 2 positions shown; findings below may reference images not displayed]

FINDINGS: The heart size and mediastinal contours are within normal limits.
Both lungs are clear. The visualized skeletal structures are
unremarkable.
IMPRESSION: No active cardiopulmonary disease.

## 2017-07-16 IMAGING — RF DG LUMBAR SPINE 2-3V
1 series · 2 of 2 positions shown · non-contrast
Comparison: None.

CLINICAL DATA: L4-5 anterior fusion

EXAM:
DG C-ARM 61-120 MIN; LUMBAR SPINE - 2-3 VIEW

[Series 1: run · 2 of 2 slices shown]
[im 1/2]
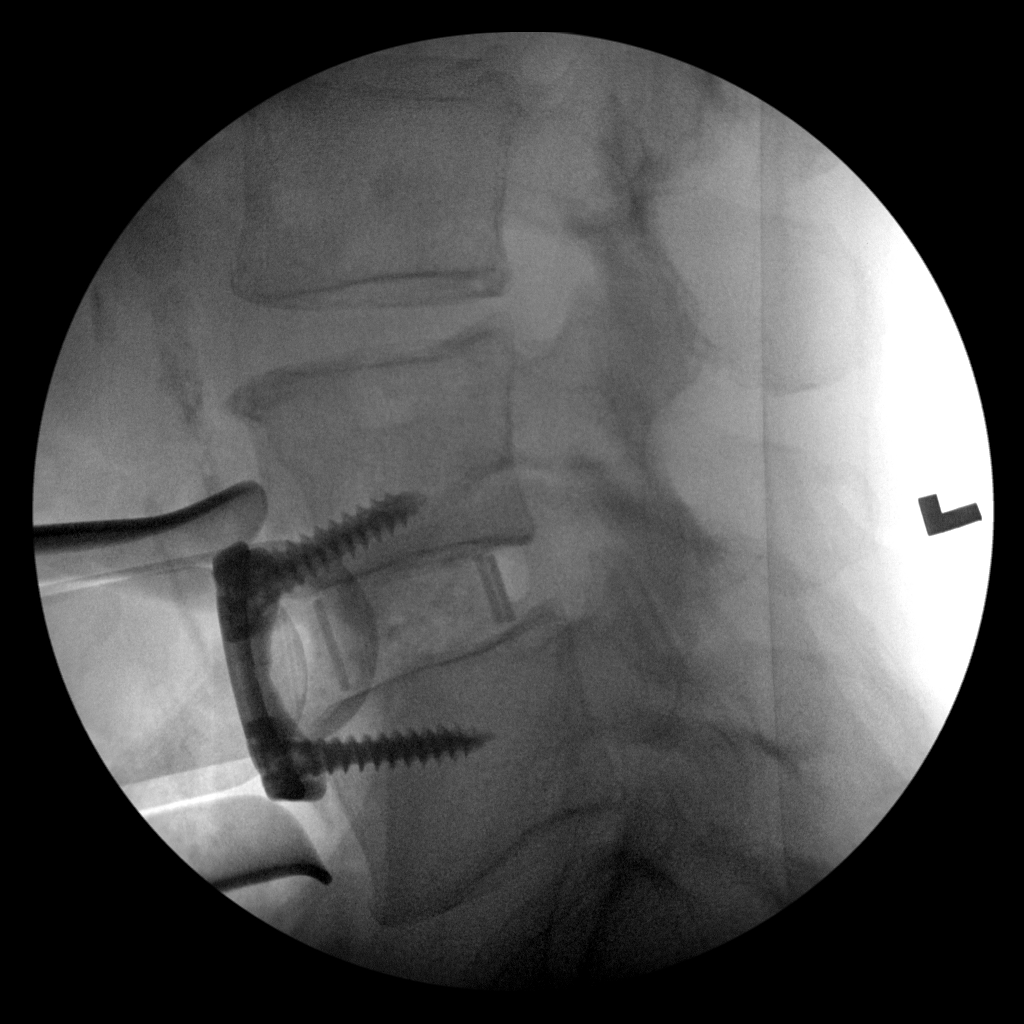
[im 2/2]
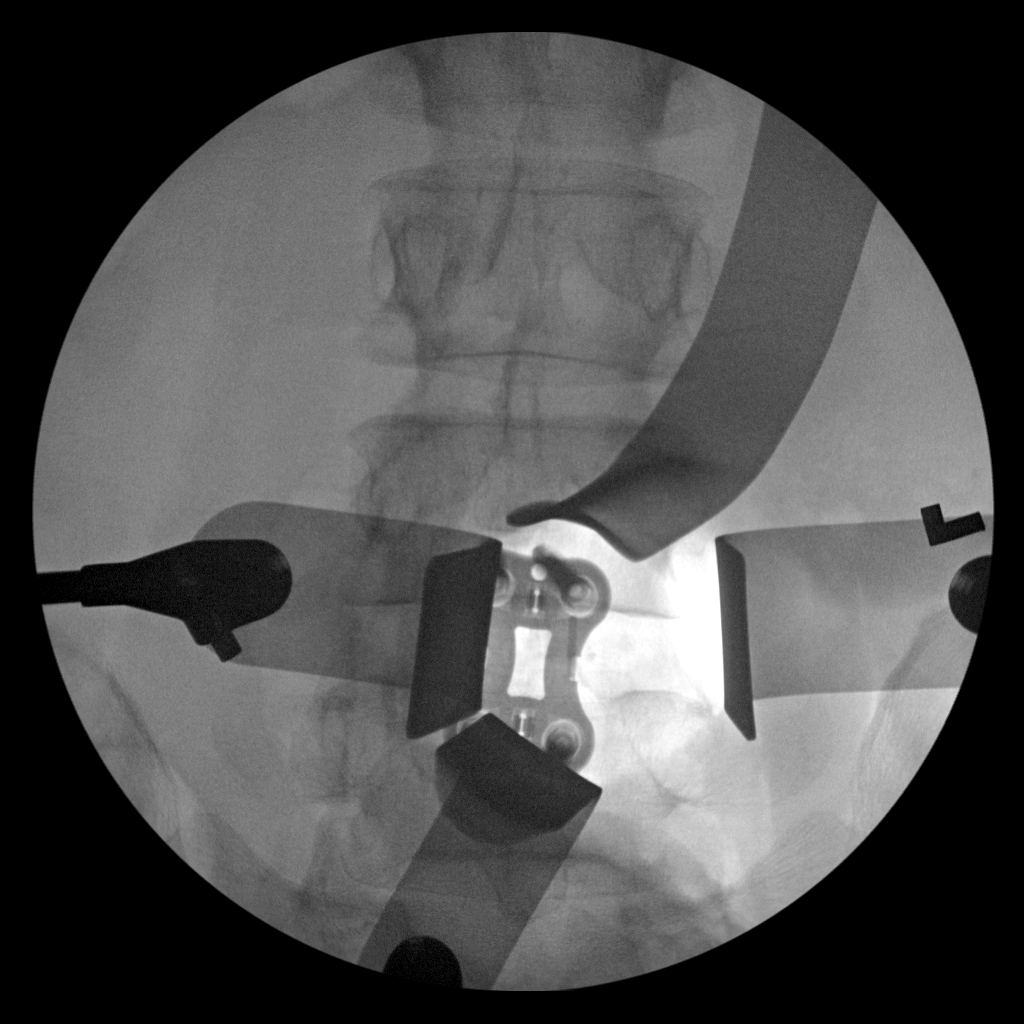

[2 of 2 positions shown; findings below may reference images not displayed]

FINDINGS: Two intraoperative spot images demonstrate changes of anterior
fusion at L4-5. Normal alignment. No hardware or bony complicating
feature.
IMPRESSION: Anterior fusion L4-5.

## 2017-07-18 ENCOUNTER — Other Ambulatory Visit: Payer: Self-pay | Admitting: Primary Care

## 2017-07-18 DIAGNOSIS — N400 Enlarged prostate without lower urinary tract symptoms: Secondary | ICD-10-CM

## 2017-08-29 ENCOUNTER — Other Ambulatory Visit: Payer: Self-pay | Admitting: Primary Care

## 2017-08-29 DIAGNOSIS — R7303 Prediabetes: Secondary | ICD-10-CM

## 2017-09-02 ENCOUNTER — Other Ambulatory Visit (INDEPENDENT_AMBULATORY_CARE_PROVIDER_SITE_OTHER): Payer: Managed Care, Other (non HMO)

## 2017-09-02 ENCOUNTER — Encounter: Payer: Self-pay | Admitting: Family Medicine

## 2017-09-02 ENCOUNTER — Ambulatory Visit (INDEPENDENT_AMBULATORY_CARE_PROVIDER_SITE_OTHER): Payer: Managed Care, Other (non HMO) | Admitting: Family Medicine

## 2017-09-02 ENCOUNTER — Other Ambulatory Visit: Payer: Self-pay

## 2017-09-02 VITALS — BP 90/62 | HR 80 | Temp 97.4°F | Ht 71.5 in | Wt 152.5 lb

## 2017-09-02 DIAGNOSIS — R7303 Prediabetes: Secondary | ICD-10-CM | POA: Diagnosis not present

## 2017-09-02 DIAGNOSIS — M5441 Lumbago with sciatica, right side: Secondary | ICD-10-CM | POA: Insufficient documentation

## 2017-09-02 DIAGNOSIS — Z981 Arthrodesis status: Secondary | ICD-10-CM

## 2017-09-02 LAB — HEMOGLOBIN A1C: HEMOGLOBIN A1C: 5.7 % (ref 4.6–6.5)

## 2017-09-02 MED ORDER — CYCLOBENZAPRINE HCL 10 MG PO TABS: 10.0000 mg | ORAL_TABLET | Freq: Every evening | ORAL | 0 refills | Status: DC | PRN

## 2017-09-02 MED ORDER — PREDNISONE 20 MG PO TABS: ORAL_TABLET | ORAL | 0 refills | Status: DC

## 2017-09-02 MED ORDER — TRAMADOL HCL 50 MG PO TABS: 50.0000 mg | ORAL_TABLET | Freq: Three times a day (TID) | ORAL | 0 refills | Status: DC | PRN

## 2017-09-02 NOTE — Addendum Note (Signed)
Addended by: Eliezer Lofts E on: 09/02/2017 09:07 AM   Modules accepted: Orders

## 2017-09-02 NOTE — Progress Notes (Signed)
   Subjective:    Patient ID: Don Christian, male    DOB: 1955/10/18, 61 y.o.   MRN: [id number]  HPI   61 year old male pt  of Don Christian with history of lumbar spinal fusion (2016) presents with new onset pain in back.  He reports gradually worse pain in last 3-4 days. Right low back, constant, better with lying  down.  Heat helps some. Ice did not help. Pain radiates down to buttock on right.. No radiation to leg.  No numbness, no weakness. Pain  Increase with standing up straight.  The was no specific injury, no fall.  Ibuprofen  600 mg twice daily.Marland Kitchen Helps minimally.  Dr. Ronnald Ramp performed the lumbar fusion in 2016.   Social History /Family History/Past Medical History reviewed in detail and updated in EMR if needed. ,Blood pressure 90/62, pulse 80, temperature (!) 97.4 F (36.3 C), temperature source Oral, height 5' 11.5" (1.816 m), weight 152 lb 8 oz (69.2 kg). Review of Systems  Constitutional: Negative for fatigue and fever.  HENT: Negative for ear pain.   Eyes: Negative for pain.  Respiratory: Negative for cough and shortness of breath.   Cardiovascular: Negative for chest pain, palpitations and leg swelling.  Gastrointestinal: Negative for abdominal pain, blood in stool, constipation and diarrhea.  Genitourinary: Negative for dysuria, hematuria and urgency.       Does have BPH on tamsulosin  Musculoskeletal: Negative for arthralgias.  Neurological: Negative for syncope, light-headedness and headaches.  Psychiatric/Behavioral: Negative for dysphoric mood.       Objective:   Physical Exam  Constitutional: Vital signs are normal. He appears well-developed and well-nourished.  HENT:  Head: Normocephalic.  Right Ear: Hearing normal.  Left Ear: Hearing normal.  Nose: Nose normal.  Mouth/Throat: Oropharynx is clear and moist and mucous membranes are normal.  Neck: Trachea normal. Carotid bruit is not present. No thyroid mass and no thyromegaly present.  Cardiovascular:  Normal rate, regular rhythm and normal pulses. Exam reveals no gallop, no distant heart sounds and no friction rub.  No murmur heard. No peripheral edema  Pulmonary/Chest: Effort normal and breath sounds normal. No respiratory distress.  Musculoskeletal:       Thoracic back: Normal. He exhibits normal range of motion and no tenderness.       Lumbar back: He exhibits decreased range of motion and tenderness. He exhibits no bony tenderness.   Positive SLR on right with straightening leg Negative faber's.  full ROM on left hip and no anterior right hip pain  Skin: Skin is warm, dry and intact. No rash noted.  Psychiatric: He has a normal mood and affect. His speech is normal and behavior is normal. Thought content normal.          Assessment & Plan:

## 2017-09-02 NOTE — Assessment & Plan Note (Signed)
New injury. Given past fusion surgery will X-ray to make sure hardware  I place.  Most likely new MSK strain vs sciatica.  treat with pred taper, muscle relaxant, heat and home PT.  Can use tramadol for break through pain.

## 2017-09-02 NOTE — Patient Instructions (Signed)
Continue heat on low back and home physical therapy.  Start prednisone taper.  Use muscle relaxant at night.   Can use tramadol for breakthrough pain.  Follow up with PCP if not improving in 2 weeks.. Call sooner if worsening symptoms.

## 2017-09-05 ENCOUNTER — Ambulatory Visit (INDEPENDENT_AMBULATORY_CARE_PROVIDER_SITE_OTHER)
Admission: RE | Admit: 2017-09-05 | Discharge: 2017-09-05 | Disposition: A | Discharge status: Home / Self Care | Payer: Managed Care, Other (non HMO) | Source: Ambulatory Visit | Attending: Family Medicine | Admitting: Family Medicine

## 2017-09-05 DIAGNOSIS — M5441 Lumbago with sciatica, right side: Secondary | ICD-10-CM

## 2017-09-05 DIAGNOSIS — M545 Low back pain: Secondary | ICD-10-CM | POA: Diagnosis not present

## 2017-09-05 DIAGNOSIS — Z981 Arthrodesis status: Secondary | ICD-10-CM | POA: Diagnosis not present

## 2017-11-29 ENCOUNTER — Telehealth: Payer: Self-pay | Admitting: *Deleted

## 2017-11-29 DIAGNOSIS — N5201 Erectile dysfunction due to arterial insufficiency: Secondary | ICD-10-CM | POA: Diagnosis not present

## 2017-11-29 DIAGNOSIS — R972 Elevated prostate specific antigen [PSA]: Secondary | ICD-10-CM | POA: Diagnosis not present

## 2017-11-29 DIAGNOSIS — N401 Enlarged prostate with lower urinary tract symptoms: Secondary | ICD-10-CM | POA: Diagnosis not present

## 2017-11-29 DIAGNOSIS — N3943 Post-void dribbling: Secondary | ICD-10-CM | POA: Diagnosis not present

## 2017-11-29 NOTE — Telephone Encounter (Signed)
Copied from [place]. Topic: Referral - Request >> Nov 29, 2017 12:27 PM Don Christian wrote: Reason for CRM: pt called to get a referral to an GI  contact pt to advise

## 2017-11-29 NOTE — Telephone Encounter (Signed)
Called patient and was advised that he is having problems with bowel movements. Patient stated that he may go 4 days without having a BM and then will have diarrhea. .  Patient stated that he has not seen a GI doctor n the past.

## 2017-11-29 NOTE — Telephone Encounter (Signed)
Please notify patient that I'd like to gather more information and also see if this is something we can handle in the primary care setting first. Please schedule an appointment at his convenience.

## 2017-11-29 NOTE — Telephone Encounter (Signed)
Patient advised. Appt scheduled. 

## 2017-12-01 ENCOUNTER — Ambulatory Visit (INDEPENDENT_AMBULATORY_CARE_PROVIDER_SITE_OTHER)
Admission: RE | Admit: 2017-12-01 | Discharge: 2017-12-01 | Disposition: A | Discharge status: Home / Self Care | Payer: Managed Care, Other (non HMO) | Source: Ambulatory Visit | Attending: Primary Care | Admitting: Primary Care

## 2017-12-01 ENCOUNTER — Ambulatory Visit (INDEPENDENT_AMBULATORY_CARE_PROVIDER_SITE_OTHER): Payer: Managed Care, Other (non HMO) | Admitting: Primary Care

## 2017-12-01 ENCOUNTER — Encounter: Payer: Self-pay | Admitting: Primary Care

## 2017-12-01 DIAGNOSIS — G8929 Other chronic pain: Secondary | ICD-10-CM | POA: Diagnosis not present

## 2017-12-01 DIAGNOSIS — M79671 Pain in right foot: Secondary | ICD-10-CM | POA: Diagnosis not present

## 2017-12-01 DIAGNOSIS — M19071 Primary osteoarthritis, right ankle and foot: Secondary | ICD-10-CM | POA: Diagnosis not present

## 2017-12-01 DIAGNOSIS — K5900 Constipation, unspecified: Secondary | ICD-10-CM

## 2017-12-01 DIAGNOSIS — M79673 Pain in unspecified foot: Secondary | ICD-10-CM

## 2017-12-01 NOTE — Assessment & Plan Note (Signed)
Sounds to be IBS with constipation type.  Overall seems to be eating a decent amount of fiber with adequate water intake. Recommended regular exercise if tolerated.  Will start with Miralax daily, reduce frequency if he experiences diarrhea. Consider Probiotics in the future. Colonoscopy UTD from 2018, reviewed. He has no alarm signs. He'll update if no improvement.

## 2017-12-01 NOTE — Assessment & Plan Note (Signed)
Located to right great toe x 5 years.  Exam today with obvious bony overgrowth with mild decrease in ROM. Check plain films today. Consider podiatry consult for orthotics/braces.

## 2017-12-01 NOTE — Progress Notes (Signed)
Subjective:    Patient ID: Don Christian, male    DOB: 07/05/1956, 62 y.o.   MRN: [id number]  HPI  Don Christian is a 62 year old male with a history of diverticulosis and hemorrhoids who presents today with a chief complaint of constipation. He'd also like to address his right foot.  1) Constipation: He's experiencing bouts of constipation for 3-5 days at a time, then will experience diarrhea for the entire day around day 4-5. He denies bloody stools. He'll take a laxative sometimes (infrequently) for constipation if he'll not have a bowel movement for one week. This is rare.  He endorses a diet that is rich in fiber including oatmeal, toast, fruit, bran muffins. He also endorses drinking plenty of water.   His symptoms have been present for past 2 years. He will experience cramping to the bilateral lower abdomen during his day of diarrhea. He's also very gassy on a daily basis per his wife. He's never tried stool softeners, Miralax, or probiotics.   2) Foot Pain: Located to the right great to at the DIP joint that has been present for 5 years. He thinks he dropped a heavy object to his foot at that time. Over time he's noticed gradual decrease in ROM with flexion and extension. He has to buy wide shoes and can no longer run due to the discomfort. He can walk without much pain. He's noticed a firm growth to the site that's been stable for years. He denies other injury/trauma, erythema, inflammation.   Review of Systems  Constitutional: Negative for fever and unexpected weight change.  Gastrointestinal: Positive for constipation and diarrhea. Negative for anal bleeding, blood in stool, nausea and vomiting.  Musculoskeletal:       Right great toe pain       Past Medical History:  Diagnosis Date  . BPH (benign prostatic hyperplasia) 09/16/2014  . Cataract    left eye  . Chronic back pain 09/16/2014  . Colon polyps   . COPD (chronic obstructive pulmonary disease) (HCC)    mild  . DDD  (degenerative disc disease), lumbar 09/16/2014  . Full dentures   . Inguinal hernia 2004  . Insomnia 09/16/2014  . Personal history of tobacco use, presenting hazards to health 03/10/2016  . Prostate disorder   . Sleeping difficulties   . Tobacco use disorder 09/16/2014     Social History   Socioeconomic History  . Marital status: Married    Spouse name: Not on file  . Number of children: Not on file  . Years of education: Not on file  . Highest education level: Not on file  Social Needs  . Financial resource strain: Not on file  . Food insecurity - worry: Not on file  . Food insecurity - inability: Not on file  . Transportation needs - medical: Not on file  . Transportation needs - non-medical: Not on file  Occupational History  . Not on file  Tobacco Use  . Smoking status: Former Smoker    Years: 20.00    Types: Cigars    Last attempt to quit: 01/10/2017    Years since quitting: 0.8  . Smokeless tobacco: Never Used  . Tobacco comment: 2 cigars daily   Substance and Sexual Activity  . Alcohol use: Yes    Alcohol/week: 0.0 oz    Comment: occassionally on social occassions  . Drug use: No  . Sexual activity: Yes  Other Topics Concern  . Not on file  Social History  Narrative   ** Merged History Encounter **       Work or School: no work currently, disabled from back injury in 2012 Home Situation: lives with wife Spiritual Beliefs: none Lifestyle: walks dog 2x per day;  Healthy diet per his report. Enjoys spending time outdoors.       Past Surgical History:  Procedure Laterality Date  . ABDOMINAL EXPOSURE N/A 05/09/2015   Procedure: ABDOMINAL EXPOSURE;  Surgeon: Rosetta Posner, MD;  Location: [facility];  Service: Vascular;  Laterality: N/A;  . ANTERIOR LUMBAR FUSION N/A 05/09/2015   Procedure: LUMBAR FOUR-FIVE ANTERIOR LUMBAR FUSION WITH ABDOMINAL EXPOSURE;  Surgeon: Eustace Moore, MD;  Location: [facility];  Service: Neurosurgery;  Laterality: N/A;  . COLONOSCOPY  W/ BIOPSIES AND POLYPECTOMY    . COLONOSCOPY WITH PROPOFOL N/A 03/18/2017   Procedure: COLONOSCOPY WITH PROPOFOL;  Surgeon: [name], MD;  Location[facility];  Service: Endoscopy;  Laterality: N/A;  . GANGLION CYST EXCISION     left leg  . HERNIA REPAIR Left    inguinal at 14   . TOOTH EXTRACTION      Family History  Problem Relation Age of Onset  . Cancer Mother        Lung  . Heart disease Mother   . Cancer Father        Lung with mets to brain  . Cancer Sister        Lung    No Known Allergies  Current Outpatient Medications on File Prior to Visit  Medication Sig Dispense Refill  . albuterol (PROVENTIL HFA;VENTOLIN HFA) 108 (90 Base) MCG/ACT inhaler Inhale 2 puffs into the lungs every 4 (four) hours as needed for wheezing or shortness of breath. 1 Inhaler 2  . buPROPion (WELLBUTRIN XL) 300 MG 24 hr tablet TAKE 1 TABLET(300 MG) BY MOUTH DAILY 90 tablet 1  . tamsulosin (FLOMAX) 0.4 MG CAPS capsule TAKE 1 CAPSULE(0.4 MG) BY MOUTH DAILY 90 capsule 1  . traZODone (DESYREL) 100 MG tablet TAKE 1 TABLET(100 MG) BY MOUTH AT BEDTIME AS NEEDED FOR SLEEP 30 tablet 5   No current facility-administered medications on file prior to visit.     BP 124/72   Pulse 77   Temp 97.8 F (36.6 C) (Oral)   Ht 5' 11.5" (1.816 m)   Wt 153 lb (69.4 kg)   SpO2 99%   BMI 21.04 kg/m     Objective:   Physical Exam  Constitutional: He appears well-nourished.  Neck: Neck supple.  Cardiovascular: Normal rate.  Pulmonary/Chest: Effort normal.  Abdominal: Soft. Bowel sounds are normal. There is no tenderness.  Musculoskeletal:  Firm/bony growth over anterior right great toe at DIP joint. Slight decrease in ROM with flexion and extension.   Skin: Skin is warm and dry. No erythema.          Assessment & Plan:

## 2017-12-01 NOTE — Patient Instructions (Signed)
Complete xray(s) prior to leaving today. I will notify you of your results once received.  Try taking Miralax once daily for chronic constipation. Mix one capful with 8 ounces of water.   Continue to work on a high fiber diet with plenty of water. Ensure you are consuming 64 ounces of water daily.  Regular exercise will help with chronic constipation. Start exercising. You should be getting 150 minutes of moderate intensity exercise weekly.  Please notify me if no improvement in 2 weeks.  It was a pleasure to see you today!   High-Fiber Diet Fiber, also called dietary fiber, is a type of carbohydrate found in fruits, vegetables, whole grains, and beans. A high-fiber diet can have many health benefits. Your health care provider may recommend a high-fiber diet to help:  Prevent constipation. Fiber can make your bowel movements more regular.  Lower your cholesterol.  Relieve hemorrhoids, uncomplicated diverticulosis, or irritable bowel syndrome.  Prevent overeating as part of a weight-loss plan.  Prevent heart disease, type 2 diabetes, and certain cancers.  What is my plan? The recommended daily intake of fiber includes:  38 grams for men under age 67.  40 grams for men over age 73.  1 grams for women under age 58.  9 grams for women over age 54.  You can get the recommended daily intake of dietary fiber by eating a variety of fruits, vegetables, grains, and beans. Your health care provider may also recommend a fiber supplement if it is not possible to get enough fiber through your diet. What do I need to know about a high-fiber diet?  Fiber supplements have not been widely studied for their effectiveness, so it is better to get fiber through food sources.  Always check the fiber content on thenutrition facts label of any prepackaged food. Look for foods that contain at least 5 grams of fiber per serving.  Ask your dietitian if you have questions about specific foods that  are related to your condition, especially if those foods are not listed in the following section.  Increase your daily fiber consumption gradually. Increasing your intake of dietary fiber too quickly may cause bloating, cramping, or gas.  Drink plenty of water. Water helps you to digest fiber. What foods can I eat? Grains Whole-grain breads. Multigrain cereal. Oats and oatmeal. Brown rice. Barley. Bulgur wheat. [place]. Bran muffins. Popcorn. Rye wafer crackers. Vegetables Sweet potatoes. Spinach. Kale. Artichokes. Cabbage. Broccoli. Green peas. Carrots. Squash. Fruits Berries. Pears. Apples. Oranges. Avocados. Prunes and raisins. Dried figs. Meats and Other Protein Sources [place], kidney, pinto, and soy beans. Split peas. Lentils. Nuts and seeds. Dairy Fiber-fortified yogurt. Beverages Fiber-fortified soy milk. Fiber-fortified orange juice. Other Fiber bars. The items listed above may not be a complete list of recommended foods or beverages. Contact your dietitian for more options. What foods are not recommended? Grains White bread. Pasta made with refined flour. White rice. Vegetables Fried potatoes. Canned vegetables. Well-cooked vegetables. Fruits Fruit juice. Cooked, strained fruit. Meats and Other Protein Sources Fatty cuts of meat. Fried Sales executive or fried fish. Dairy Milk. Yogurt. Cream cheese. Sour cream. Beverages Soft drinks. Other Cakes and pastries. Butter and oils. The items listed above may not be a complete list of foods and beverages to avoid. Contact your dietitian for more information. What are some tips for including high-fiber foods in my diet?  Eat a wide variety of high-fiber foods.  Make sure that half of all grains consumed each day are whole grains.  Replace  breads and cereals made from refined flour or white flour with whole-grain breads and cereals.  Replace white rice with brown rice, bulgur wheat, or millet.  Start the day with a breakfast that  is high in fiber, such as a cereal that contains at least 5 grams of fiber per serving.  Use beans in place of meat in soups, salads, or pasta.  Eat high-fiber snacks, such as berries, raw vegetables, nuts, or popcorn. This information is not intended to replace advice given to you by your health care provider. Make sure you discuss any questions you have with your health care provider. Document Released: 09/20/2005 Document Revised: 02/26/2016 Document Reviewed: 03/05/2014 Elsevier Interactive Patient Education  [employer].

## 2017-12-12 ENCOUNTER — Other Ambulatory Visit: Payer: Self-pay

## 2017-12-12 ENCOUNTER — Encounter: Payer: Self-pay | Admitting: Family Medicine

## 2017-12-12 ENCOUNTER — Ambulatory Visit (INDEPENDENT_AMBULATORY_CARE_PROVIDER_SITE_OTHER): Payer: Managed Care, Other (non HMO) | Admitting: Family Medicine

## 2017-12-12 VITALS — BP 112/60 | HR 76 | Temp 97.8°F | Ht 71.5 in | Wt 157.2 lb

## 2017-12-12 DIAGNOSIS — M19071 Primary osteoarthritis, right ankle and foot: Secondary | ICD-10-CM

## 2017-12-12 NOTE — Progress Notes (Signed)
Dr. Frederico Hamman T. Xzayvion Vaeth, MD, [facility] [place], [address] Phone: [phone] Fax: [phone]  12/12/2017  Patient: Don Christian, MRN: [id number], DOB: [birth year], 62 y.o.  Primary Physician:  Pleas Koch, NP   Chief Complaint  Patient presents with  . Toe Pain    Right Big Toe   Subjective:   Don Christian is a 62 y.o. very pleasant male patient who presents with the following:  The patient presents with end-stage osteoarthritis of the right MTP joint.  At this point he functionally has no motion at the MTP joint of the great toe.  He has not been able to run in about 5 years.  Today, he presents for any type of suggestion that I could make, with a goal of being able to run.  Currently, he is very limited on what shoes he can wear, and he has limited ability even to  Walk.  He has no known history of gout or CPPD, he has no significant history of traumatic fracture, injury, or surgical intervention in the affected area.  Has been that way for a long. Uncleear if fracture in this area.   Past Medical History, Surgical History, Social History, Family History, Problem List, Medications, and Allergies have been reviewed and updated if relevant.  Patient Active Problem List   Diagnosis Date Noted  . Chronic foot pain 12/01/2017  . Constipation 12/01/2017  . Acute right-sided low back pain with right-sided sciatica 09/02/2017  . Flank pain 02/16/2017  . Hyperlipidemia 01/27/2017  . Prediabetes 01/27/2017  . Personal history of tobacco use, presenting hazards to health 03/10/2016  . Depression 01/27/2016  . Preventative health care 01/27/2016  . Right shoulder pain 01/27/2016  . Varicella zoster 11/19/2015  . S/P lumbar spinal fusion 05/09/2015  . DDD (degenerative disc disease), lumbar 09/16/2014  . Tobacco use disorder 09/16/2014  . BPH (benign prostatic hyperplasia) 09/16/2014  . Insomnia 09/16/2014     Past Medical History:  Diagnosis Date  . BPH (benign prostatic hyperplasia) 09/16/2014  . Cataract    left eye  . Chronic back pain 09/16/2014  . Colon polyps   . COPD (chronic obstructive pulmonary disease) (HCC)    mild  . DDD (degenerative disc disease), lumbar 09/16/2014  . Full dentures   . Inguinal hernia 2004  . Insomnia 09/16/2014  . Personal history of tobacco use, presenting hazards to health 03/10/2016  . Prostate disorder   . Sleeping difficulties   . Tobacco use disorder 09/16/2014    Past Surgical History:  Procedure Laterality Date  . ABDOMINAL EXPOSURE N/A 05/09/2015   Procedure: ABDOMINAL EXPOSURE;  Surgeon: Rosetta Posner, MD;  Location: [facility];  Service: Vascular;  Laterality: N/A;  . ANTERIOR LUMBAR FUSION N/A 05/09/2015   Procedure: LUMBAR FOUR-FIVE ANTERIOR LUMBAR FUSION WITH ABDOMINAL EXPOSURE;  Surgeon: Eustace Moore, MD;  Location: [facility];  Service: Neurosurgery;  Laterality: N/A;  . COLONOSCOPY W/ BIOPSIES AND POLYPECTOMY    . COLONOSCOPY WITH PROPOFOL N/A 03/18/2017   Procedure: COLONOSCOPY WITH PROPOFOL;  Surgeon: [name], MD;  Location: [facility];  Service: Endoscopy;  Laterality: N/A;  . GANGLION CYST EXCISION     left leg  . HERNIA REPAIR Left    inguinal at 14   . TOOTH EXTRACTION      Social History   Socioeconomic History  . Marital status: Married    Spouse name: Not on file  .  Number of children: Not on file  . Years of education: Not on file  . Highest education level: Not on file  Social Needs  . Financial resource strain: Not on file  . Food insecurity - worry: Not on file  . Food insecurity - inability: Not on file  . Transportation needs - medical: Not on file  . Transportation needs - non-medical: Not on file  Occupational History  . Not on file  Tobacco Use  . Smoking status: Former Smoker    Years: 20.00    Types: Cigars    Last attempt to quit: 01/10/2017    Years since quitting: 0.9  . Smokeless  tobacco: Never Used  . Tobacco comment: 2 cigars daily   Substance and Sexual Activity  . Alcohol use: Yes    Alcohol/week: 0.0 oz    Comment: occassionally on social occassions  . Drug use: No  . Sexual activity: Yes  Other Topics Concern  . Not on file  Social History Narrative   ** Merged History Encounter **       Work or School: no work currently, disabled from back injury in 2012 Home Situation: lives with wife Spiritual Beliefs: none Lifestyle: walks dog 2x per day;  Healthy diet per his report. Enjoys spending time outdoors.       Family History  Problem Relation Age of Onset  . Cancer Mother        Lung  . Heart disease Mother   . Cancer Father        Lung with mets to brain  . Cancer Sister        Lung    No Known Allergies  Medication list reviewed and updated in full in [place].  GEN: No fevers, chills. Nontoxic. Primarily MSK c/o today. MSK: Detailed in the HPI GI: tolerating PO intake without difficulty Neuro: No numbness, parasthesias, or tingling associated. Otherwise the pertinent positives of the ROS are noted above.   Objective:   BP 112/60   Pulse 76   Temp 97.8 F (36.6 C) (Oral)   Ht 5' 11.5" (1.816 m)   Wt 157 lb 4 oz (71.3 kg)   BMI 21.63 kg/m    GEN: WDWN, NAD, Non-toxic, Alert & Oriented x 3 HEENT: Atraumatic, Normocephalic.  Ears and Nose: No external deformity. EXTR: No clubbing/cyanosis/edema NEURO: Normal gait.  PSYCH: Normally interactive. Conversant. Not depressed or anxious appearing.  Calm demeanor.    From a bony anatomy standpoint, the patient is nontender throughout the entirety of the ankle, distal fibula, distal  tibia, midfoot, as well as all 2 through 4 metatarsals and 2 through fourth phalanges as well as 2 through 4 MTP joints.  Patient has market bony enlargement at the MTP joint visually on the right, but he also has bunion formation to a lesser extent on the left.  Patient has palpation pain along  the joint line of the first MTP as well as pain with even minimal flexion or extension at the first MTP joint.  Radiology: Dg Foot Complete Right  Result Date: 12/01/2017 CLINICAL DATA:  Chronic pain in the right great toe EXAM: RIGHT FOOT COMPLETE - 3+ VIEW COMPARISON:  None. FINDINGS: Significant degenerative joint disease involves the right first MTP joint. There is complete loss of joint space with sclerosis spurring and subchondral cyst formation at that site. No erosion is seen and no soft tissue calcification is noted. The remainder of joint spaces appear normal. Tarsal-metatarsal alignment is normal.  There is mild degenerative change in the midfoot. IMPRESSION: Significant degenerative joint disease for age involving the right first MTP joint. Electronically Signed   By: Ivar Drape M.D.   On: 12/01/2017 08:49   Assessment and Plan:   Osteoarthritis of first metatarsophalangeal (MTP) joint of right foot  >25 minutes spent in face to face time with patient, >50% spent in counselling or coordination of care   The patient has some extraordinary end-stage MTP joint osteoarthritis at age 41, with bone-on-bone osteoarthritic changes.  His goal of running is laudable, but I think that it is unlikely that he will be able to achieve this.  I gave him some sports insoles, and made a metatarsal bar across the entirety of the MTP joint region out of forearm to provide additional cushioning in this region.  Hopefully this will help in some degree.  I tried to be frank with the patient.  Surgical options may give him potential greater options for activity after recovery.  I asked him to call me in a few weeks if he is not making satisfactory progress, and I be happy to have him see Dr. Doran Durand for his opinion.  Follow-up: No Follow-up on file.  Signed,  Maud Deed. Ronisha Herringshaw, MD   Allergies as of 12/12/2017   No Known Allergies     Medication List        Accurate as of 12/12/17 11:59 PM. Always  use your most recent med list.          albuterol 108 (90 Base) MCG/ACT inhaler Commonly known as:  PROVENTIL HFA;VENTOLIN HFA Inhale 2 puffs into the lungs every 4 (four) hours as needed for wheezing or shortness of breath.   buPROPion 300 MG 24 hr tablet Commonly known as:  WELLBUTRIN XL TAKE 1 TABLET(300 MG) BY MOUTH DAILY   tadalafil 20 MG tablet Commonly known as:  CIALIS TK 1 T PO PRN   tamsulosin 0.4 MG Caps capsule Commonly known as:  FLOMAX TAKE 1 CAPSULE(0.4 MG) BY MOUTH DAILY   traZODone 100 MG tablet Commonly known as:  DESYREL TAKE 1 TABLET(100 MG) BY MOUTH AT BEDTIME AS NEEDED FOR SLEEP

## 2017-12-13 ENCOUNTER — Other Ambulatory Visit: Payer: Self-pay | Admitting: Primary Care

## 2017-12-13 DIAGNOSIS — F32A Depression, unspecified: Secondary | ICD-10-CM

## 2017-12-13 DIAGNOSIS — F329 Major depressive disorder, single episode, unspecified: Secondary | ICD-10-CM

## 2018-01-20 ENCOUNTER — Other Ambulatory Visit: Payer: Self-pay | Admitting: Primary Care

## 2018-01-20 DIAGNOSIS — N400 Enlarged prostate without lower urinary tract symptoms: Secondary | ICD-10-CM

## 2018-03-14 ENCOUNTER — Telehealth: Payer: Self-pay | Admitting: *Deleted

## 2018-03-14 DIAGNOSIS — Z87891 Personal history of nicotine dependence: Secondary | ICD-10-CM

## 2018-03-14 DIAGNOSIS — Z122 Encounter for screening for malignant neoplasm of respiratory organs: Secondary | ICD-10-CM

## 2018-03-14 NOTE — Telephone Encounter (Signed)
Notified patient that annual lung cancer screening low dose CT scan is due currently or will be in near future. Confirmed that patient is within the age range of 55-77, and asymptomatic, (no signs or symptoms of lung cancer). Patient denies illness that would prevent curative treatment for lung cancer if found. Verified smoking history, (former, quit 2015, 37 pack year). The shared decision making visit was done 01/31/15. Patient is agreeable for CT scan being scheduled.

## 2018-03-21 ENCOUNTER — Other Ambulatory Visit: Payer: Self-pay | Admitting: Primary Care

## 2018-03-21 DIAGNOSIS — Z125 Encounter for screening for malignant neoplasm of prostate: Secondary | ICD-10-CM

## 2018-03-21 DIAGNOSIS — R7303 Prediabetes: Secondary | ICD-10-CM

## 2018-03-21 DIAGNOSIS — E785 Hyperlipidemia, unspecified: Secondary | ICD-10-CM

## 2018-03-22 ENCOUNTER — Ambulatory Visit
Admission: RE | Admit: 2018-03-22 | Discharge: 2018-03-22 | Disposition: A | Discharge status: Home / Self Care | Payer: Medicare Other | Source: Ambulatory Visit | Attending: Nurse Practitioner | Admitting: Nurse Practitioner

## 2018-03-22 DIAGNOSIS — J438 Other emphysema: Secondary | ICD-10-CM | POA: Insufficient documentation

## 2018-03-22 DIAGNOSIS — J432 Centrilobular emphysema: Secondary | ICD-10-CM | POA: Diagnosis not present

## 2018-03-22 DIAGNOSIS — Z87891 Personal history of nicotine dependence: Secondary | ICD-10-CM

## 2018-03-22 DIAGNOSIS — I700 Atherosclerosis of aorta: Secondary | ICD-10-CM | POA: Insufficient documentation

## 2018-03-22 DIAGNOSIS — Z122 Encounter for screening for malignant neoplasm of respiratory organs: Secondary | ICD-10-CM | POA: Diagnosis not present

## 2018-03-27 ENCOUNTER — Ambulatory Visit (INDEPENDENT_AMBULATORY_CARE_PROVIDER_SITE_OTHER): Payer: Medicare Other

## 2018-03-27 ENCOUNTER — Encounter: Payer: Self-pay | Admitting: *Deleted

## 2018-03-27 ENCOUNTER — Other Ambulatory Visit (INDEPENDENT_AMBULATORY_CARE_PROVIDER_SITE_OTHER): Payer: Medicare Other

## 2018-03-27 VITALS — BP 130/80 | HR 72 | Temp 98.0°F | Ht 72.0 in | Wt 148.5 lb

## 2018-03-27 DIAGNOSIS — R7303 Prediabetes: Secondary | ICD-10-CM

## 2018-03-27 DIAGNOSIS — Z0000 Encounter for general adult medical examination without abnormal findings: Secondary | ICD-10-CM | POA: Diagnosis not present

## 2018-03-27 DIAGNOSIS — E785 Hyperlipidemia, unspecified: Secondary | ICD-10-CM

## 2018-03-27 DIAGNOSIS — Z125 Encounter for screening for malignant neoplasm of prostate: Secondary | ICD-10-CM

## 2018-03-27 LAB — COMPREHENSIVE METABOLIC PANEL
ALK PHOS: 67 U/L (ref 39–117)
ALT: 10 U/L (ref 0–53)
AST: 13 U/L (ref 0–37)
Albumin: 4.3 g/dL (ref 3.5–5.2)
BILIRUBIN TOTAL: 0.5 mg/dL (ref 0.2–1.2)
BUN: 15 mg/dL (ref 6–23)
CALCIUM: 9.7 mg/dL (ref 8.4–10.5)
CO2: 29 mEq/L (ref 19–32)
Chloride: 102 mEq/L (ref 96–112)
Creatinine, Ser: 0.86 mg/dL (ref 0.40–1.50)
GFR: 95.43 mL/min (ref >60.00)
Glucose, Bld: 95 mg/dL (ref 70–99)
Potassium: 4.5 mEq/L (ref 3.5–5.1)
Sodium: 137 mEq/L (ref 135–145)
Total Protein: 6.6 g/dL (ref 6.0–8.3)

## 2018-03-27 LAB — LIPID PANEL
CHOL/HDL RATIO: 4
Cholesterol: 187 mg/dL (ref 0–200)
HDL: 49.6 mg/dL (ref >39.00)
LDL Cholesterol: 122 mg/dL — ABNORMAL HIGH (ref 0–99)
NONHDL: 137.12
TRIGLYCERIDES: 75 mg/dL (ref 0.0–149.0)
VLDL: 15 mg/dL (ref 0.0–40.0)

## 2018-03-27 LAB — HEMOGLOBIN A1C: Hgb A1c MFr Bld: 5.9 % (ref 4.6–6.5)

## 2018-03-27 LAB — PSA, MEDICARE: PSA: 2.1 ng/ml (ref 0.10–4.00)

## 2018-03-27 NOTE — Patient Instructions (Signed)
Don Christian , Thank you for taking time to come for your Medicare Wellness Visit. I appreciate your ongoing commitment to your health goals. Please review the following plan we discussed and let me know if I can assist you in the future.   These are the goals we discussed: Goals    . Increase physical activity     Starting 03/27/2018, I will continue to exercise for at least 20 minutes daily.        This is a list of the screening recommended for you and due dates:  Health Maintenance  Topic Date Due  . Flu Shot  05/04/2018  . Tetanus Vaccine  10/05/2019  . Colon Cancer Screening  03/19/2027  .  Hepatitis C: One time screening is recommended by Center for Disease Control  (CDC) for  adults born from [birth year] through 1966.   Completed  . HIV Screening  Completed   Preventive Care for Adults  A healthy lifestyle and preventive care can promote health and wellness. Preventive health guidelines for adults include the following key practices.  . A routine yearly physical is a good way to check with your health care provider about your health and preventive screening. It is a chance to share any concerns and updates on your health and to receive a thorough exam.  . Visit your dentist for a routine exam and preventive care every 6 months. Brush your teeth twice a day and floss once a day. Good oral hygiene prevents tooth decay and gum disease.  . The frequency of eye exams is based on your age, health, family medical history, use  of contact lenses, and other factors. Follow your health care provider's recommendations for frequency of eye exams.  . Eat a healthy diet. Foods like vegetables, fruits, whole grains, low-fat dairy products, and lean protein foods contain the nutrients you need without too many calories. Decrease your intake of foods high in solid fats, added sugars, and salt. Eat the right amount of calories for you. Get information about a proper diet from your health care provider,  if necessary.  . Regular physical exercise is one of the most important things you can do for your health. Most adults should get at least 150 minutes of moderate-intensity exercise (any activity that increases your heart rate and causes you to sweat) each week. In addition, most adults need muscle-strengthening exercises on 2 or more days a week.  Silver Sneakers may be a benefit available to you. To determine eligibility, you may visit the website: www.silversneakers.com or contact program at (208)259-8320 Mon-Fri between 8AM-8PM.   . Maintain a healthy weight. The body mass index (BMI) is a screening tool to identify possible weight problems. It provides an estimate of body fat based on height and weight. Your health care provider can find your BMI and can help you achieve or maintain a healthy weight.   For adults 20 years and older: ? A BMI below 18.5 is considered underweight. ? A BMI of 18.5 to 24.9 is normal. ? A BMI of 25 to 29.9 is considered overweight. ? A BMI of 30 and above is considered obese.   . Maintain normal blood lipids and cholesterol levels by exercising and minimizing your intake of saturated fat. Eat a balanced diet with plenty of fruit and vegetables. Blood tests for lipids and cholesterol should begin at age 33 and be repeated every 5 years. If your lipid or cholesterol levels are high, you are over 50, or you are  at high risk for heart disease, you may need your cholesterol levels checked more frequently. Ongoing high lipid and cholesterol levels should be treated with medicines if diet and exercise are not working.  . If you smoke, find out from your health care provider how to quit. If you do not use tobacco, please do not start.  . If you choose to drink alcohol, please do not consume more than 2 drinks per day. One drink is considered to be 12 ounces (355 mL) of beer, 5 ounces (148 mL) of wine, or 1.5 ounces (44 mL) of liquor.  . If you are 61-23 years old, ask  your health care provider if you should take aspirin to prevent strokes.  . Use sunscreen. Apply sunscreen liberally and repeatedly throughout the day. You should seek shade when your shadow is shorter than you. Protect yourself by wearing long sleeves, pants, a wide-brimmed hat, and sunglasses year round, whenever you are outdoors.  . Once a month, do a whole body skin exam, using a mirror to look at the skin on your back. Tell your health care provider of new moles, moles that have irregular borders, moles that are larger than a pencil eraser, or moles that have changed in shape or color.

## 2018-03-29 NOTE — Progress Notes (Signed)
PCP notes:   Health maintenance:  No gaps identified.   Abnormal screenings:   None  Patient concerns:   Intermittent back pain   Nurse concerns:  None  Next PCP appt:   04/18/18 @ 0815

## 2018-03-29 NOTE — Progress Notes (Signed)
Subjective:   Sylvia Kondracki is a 62 y.o. male who presents for Medicare Annual/Subsequent preventive examination.  Review of Systems:  N/A Cardiac Risk Factors include: advanced age (>72men, >28 women);smoking/ tobacco exposure;male gender     Objective:    Vitals: BP 130/80 (BP Location: Right Arm, Patient Position: Sitting, Cuff Size: Normal)   Pulse 72   Temp 98 F (36.7 C) (Oral)   Ht 6' (1.829 m) Comment: no shoes  Wt 148 lb 8 oz (67.4 kg)   SpO2 98%   BMI 20.14 kg/m   Body mass index is 20.14 kg/m.  Advanced Directives 03/27/2018 03/18/2017 01/18/2017 05/09/2015 05/07/2015  Does Patient Have a Medical Advance Directive? No No No Yes No  Type of Advance Directive - - Public librarian;Living will -  Would patient like information on creating a medical advance directive? Yes (MAU/Ambulatory/Procedural Areas - Information given) - - - Yes - Educational materials given    Tobacco Social History   Tobacco Use  Smoking Status Current Every Day Smoker  . Years: 20.00  . Types: Cigars  Smokeless Tobacco Never Used  Tobacco Comment   2 cigars daily      Ready to quit: No Counseling given: No Comment: 2 cigars daily    Clinical Intake:  Pre-visit preparation completed: Yes  Pain : 0-10 Pain Score: 5  Pain Type: Chronic pain Pain Location: Back Pain Orientation: Lower Pain Descriptors / Indicators: Constant Pain Onset: More than a month ago Pain Frequency: Constant     Nutritional Status: BMI of 19-24  Normal Nutritional Risks: None Diabetes: No  How often do you need to have someone help you when you read instructions, pamphlets, or other written materials from your doctor or pharmacy?: 1 - Never What is the last grade level you completed in school?: GED  Interpreter Needed?: No  Comments: pt lives with spouse Information entered by :: LPinson, LPN  Past Medical History:  Diagnosis Date  . BPH (benign prostatic hyperplasia) 09/16/2014  .  Cataract    left eye  . Chronic back pain 09/16/2014  . Colon polyps   . COPD (chronic obstructive pulmonary disease) (HCC)    mild  . DDD (degenerative disc disease), lumbar 09/16/2014  . Full dentures   . Inguinal hernia 2004  . Insomnia 09/16/2014  . Personal history of tobacco use, presenting hazards to health 03/10/2016  . Prostate disorder   . Sleeping difficulties   . Tobacco use disorder 09/16/2014   Past Surgical History:  Procedure Laterality Date  . ABDOMINAL EXPOSURE N/A 05/09/2015   Procedure: ABDOMINAL EXPOSURE;  Surgeon: Rosetta Posner, MD;  Location: [facility];  Service: Vascular;  Laterality: N/A;  . ANTERIOR LUMBAR FUSION N/A 05/09/2015   Procedure: LUMBAR FOUR-FIVE ANTERIOR LUMBAR FUSION WITH ABDOMINAL EXPOSURE;  Surgeon: Eustace Moore, MD;  Location: [facility];  Service: Neurosurgery;  Laterality: N/A;  . COLONOSCOPY W/ BIOPSIES AND POLYPECTOMY    . COLONOSCOPY WITH PROPOFOL N/A 03/18/2017   Procedure: COLONOSCOPY WITH PROPOFOL;  Surgeon: [name], MD;  Location: [facility] ENDOSCOPY;  Service: Endoscopy;  Laterality: N/A;  . GANGLION CYST EXCISION     left leg  . HERNIA REPAIR Left    inguinal at 14   . TOOTH EXTRACTION     Family History  Problem Relation Age of Onset  . Cancer Mother        Lung  . Heart disease Mother   . Cancer Father  Lung with mets to brain  . Cancer Sister        Lung   Social History   Socioeconomic History  . Marital status: Married    Spouse name: Not on file  . Number of children: Not on file  . Years of education: Not on file  . Highest education level: Not on file  Occupational History  . Not on file  Social Needs  . Financial resource strain: Not on file  . Food insecurity:    Worry: Not on file    Inability: Not on file  . Transportation needs:    Medical: Not on file    Non-medical: Not on file  Tobacco Use  . Smoking status: Current Every Day Smoker    Years: 20.00    Types: Cigars  . Smokeless  tobacco: Never Used  . Tobacco comment: 2 cigars daily   Substance and Sexual Activity  . Alcohol use: Yes    Alcohol/week: 0.0 oz    Comment: occassionally on social occassions  . Drug use: No  . Sexual activity: Yes  Lifestyle  . Physical activity:    Days per week: Not on file    Minutes per session: Not on file  . Stress: Not on file  Relationships  . Social connections:    Talks on phone: Not on file    Gets together: Not on file    Attends religious service: Not on file    Active member of club or organization: Not on file    Attends meetings of clubs or organizations: Not on file    Relationship status: Not on file  Other Topics Concern  . Not on file  Social History Narrative   ** Merged History Encounter **       Work or School: no work currently, disabled from back injury in 2012 Home Situation: lives with wife Spiritual Beliefs: none Lifestyle: walks dog 2x per day;  Healthy diet per his report. Enjoys spending time outdoors.       Outpatient Encounter Medications as of 03/27/2018  Medication Sig  . albuterol (PROVENTIL HFA;VENTOLIN HFA) 108 (90 Base) MCG/ACT inhaler Inhale 2 puffs into the lungs every 4 (four) hours as needed for wheezing or shortness of breath.  Marland Kitchen buPROPion (WELLBUTRIN XL) 300 MG 24 hr tablet TAKE 1 TABLET(300 MG) BY MOUTH DAILY  . tadalafil (CIALIS) 20 MG tablet TK 1 T PO PRN  . tamsulosin (FLOMAX) 0.4 MG CAPS capsule TAKE 1 CAPSULE(0.4 MG) BY MOUTH DAILY  . traZODone (DESYREL) 100 MG tablet TAKE 1 TABLET(100 MG) BY MOUTH AT BEDTIME AS NEEDED FOR SLEEP   No facility-administered encounter medications on file as of 03/27/2018.     Activities of Daily Living In your present state of health, do you have any difficulty performing the following activities: 03/27/2018  Hearing? N  Vision? N  Difficulty concentrating or making decisions? N  Walking or climbing stairs? Y  Dressing or bathing? N  Doing errands, shopping? N  Preparing Food and  eating ? N  Using the Toilet? N  In the past six months, have you accidently leaked urine? N  Do you have problems with loss of bowel control? N  Managing your Medications? N  Managing your Finances? N  Housekeeping or managing your Housekeeping? N  Some recent data might be hidden    Patient Care Team: Pleas Koch, NP as PCP - General (Internal Medicine) Carlis Abbott Leticia Penna, NP (Nurse Practitioner) Robert Bellow, MD (  General Surgery)   Assessment:   This is a routine wellness examination for Pearce.   Hearing Screening   125Hz  250Hz  500Hz  1000Hz  2000Hz  3000Hz  4000Hz  6000Hz  8000Hz   Right ear:   40 40 40  40    Left ear:   40 40 40  40    Vision Screening Comments: Vision exam in Dec 2018 with Dr. Maryruth Hancock B.     Exercise Activities and Dietary recommendations Current Exercise Habits: Home exercise routine, Type of exercise: walking, Time (Minutes): 20, Frequency (Times/Week): 7, Weekly Exercise (Minutes/Week): 140, Intensity: Moderate  Goals    . Increase physical activity     Starting 03/27/2018, I will continue to exercise for at least 20 minutes daily.        Fall Risk Fall Risk  03/27/2018 01/18/2017  Falls in the past year? No No   Depression Screen PHQ 2/9 Scores 03/27/2018 01/18/2017 01/27/2016  PHQ - 2 Score 0 0 4  PHQ- 9 Score 0 - 10    Cognitive Function MMSE - Mini Mental State Exam 03/27/2018 01/18/2017  Orientation to time 5 5  Orientation to Place 5 5  Registration 3 3  Attention/ Calculation 0 0  Recall 3 3  Language- name 2 objects 0 0  Language- repeat 1 1  Language- follow 3 step command 3 3  Language- read & follow direction 0 0  Write a sentence 0 0  Copy design 0 0  Total score 20 20     PLEASE NOTE: A Mini-Cog screen was completed. Maximum score is 20. A value of 0 denotes this part of [place] MMSE was not completed or the patient failed this part of the Mini-Cog screening.   Mini-Cog Screening Orientation to Time - Max 5  pts Orientation to Place - Max 5 pts Registration - Max 3 pts Recall - Max 3 pts Language Repeat - Max 1 pts Language Follow 3 Step Command - Max 3 pts     Immunization History  Administered Date(s) Administered  . Influenza,inj,Quad PF,6+ Mos 09/16/2014, 08/11/2015, 07/12/2017  . Zoster Recombinat (Shingrix) 01/29/2017    Screening Tests Health Maintenance  Topic Date Due  . INFLUENZA VACCINE  05/04/2018  . TETANUS/TDAP  10/05/2019  . COLONOSCOPY  03/19/2027  . Hepatitis C Screening  Completed  . HIV Screening  Completed      Plan:     I have personally reviewed, addressed, and noted the following in the patient's chart:  A. Medical and social history B. Use of alcohol, tobacco or illicit drugs  C. Current medications and supplements D. Functional ability and status E.  Nutritional status F.  Physical activity G. Advance directives H. List of other physicians I.  Hospitalizations, surgeries, and ER visits in previous 12 months J.  [place] to include hearing, vision, cognitive, depression L. Referrals and appointments - none  In addition, I have reviewed and discussed with patient certain preventive protocols, quality metrics, and best practice recommendations. A written personalized care plan for preventive services as well as general preventive health recommendations were provided to patient.  See attached scanned questionnaire for additional information.   Signed,   Lindell Noe, MHA, BS, LPN Health Coach

## 2018-04-12 NOTE — Progress Notes (Signed)
I reviewed health advisor's note, was available for consultation, and agree with documentation and plan.  

## 2018-04-18 ENCOUNTER — Encounter: Payer: Self-pay | Admitting: Primary Care

## 2018-04-18 ENCOUNTER — Ambulatory Visit (INDEPENDENT_AMBULATORY_CARE_PROVIDER_SITE_OTHER): Payer: Medicare Other | Admitting: Primary Care

## 2018-04-18 VITALS — BP 114/70 | HR 72 | Temp 97.8°F | Ht 72.0 in | Wt 145.2 lb

## 2018-04-18 DIAGNOSIS — Z23 Encounter for immunization: Secondary | ICD-10-CM

## 2018-04-18 DIAGNOSIS — R7303 Prediabetes: Secondary | ICD-10-CM | POA: Diagnosis not present

## 2018-04-18 DIAGNOSIS — E785 Hyperlipidemia, unspecified: Secondary | ICD-10-CM | POA: Diagnosis not present

## 2018-04-18 DIAGNOSIS — N400 Enlarged prostate without lower urinary tract symptoms: Secondary | ICD-10-CM | POA: Diagnosis not present

## 2018-04-18 DIAGNOSIS — F3342 Major depressive disorder, recurrent, in full remission: Secondary | ICD-10-CM

## 2018-04-18 DIAGNOSIS — I700 Atherosclerosis of aorta: Secondary | ICD-10-CM | POA: Diagnosis not present

## 2018-04-18 DIAGNOSIS — N529 Male erectile dysfunction, unspecified: Secondary | ICD-10-CM | POA: Diagnosis not present

## 2018-04-18 DIAGNOSIS — F17200 Nicotine dependence, unspecified, uncomplicated: Secondary | ICD-10-CM | POA: Diagnosis not present

## 2018-04-18 DIAGNOSIS — G4700 Insomnia, unspecified: Secondary | ICD-10-CM

## 2018-04-18 MED ORDER — ATORVASTATIN CALCIUM 10 MG PO TABS: ORAL_TABLET | ORAL | 3 refills | Status: DC

## 2018-04-18 NOTE — Patient Instructions (Addendum)
Please notify me if you'd like to proceed with Chantix. It's very important to stop smoking.   You were provided with a pneumonia vaccination today. You are due for another at age 62.  Use the albuterol inhaler only as needed. Please notify me if you continue to require use (more than three times weekly) during the late Summer/Fall months.  You will be contacted regarding your referral to Cardiology.  Please let us know if you have not been contacted within one week.   Start atorvastatin 10 mg tablets for atherosclerosis and prevention of heart disease/stroke. Take 1 tablet by mouth every evening.  You should be getting 150 minutes of moderate intensity exercise weekly.  Make sure to eat plenty of fresh fruit, vegetables, whole grains, lean protein.  Ensure you are consuming 64 ounces of water daily.  Schedule a lab only appointment in 6 weeks to recheck cholesterol and liver function.  We will see you in 1 year for your annual exam or sooner if needed.  It was a pleasure to see you today!

## 2018-04-18 NOTE — Assessment & Plan Note (Signed)
Stopped Wellbutrin on his own over one month ago as he found no benefit. Continue off of medication for now. Continue to monitor.

## 2018-04-18 NOTE — Assessment & Plan Note (Signed)
LDL of 122. ASCVD risk score of 12%. Given history of tobacco abuse, prediabetes, and newly discovered aortic atherosclerosis will initiate low dose statin treatment.  Discussed this in detail with patient, he agrees. Rx for atorvastatin 10 mg sent to pharmacy. Repeat Lipids and LFT's in 6 weeks.

## 2018-04-18 NOTE — Assessment & Plan Note (Signed)
Interested in Chantix, he will inquire on cost first. Discontinued Wellbutrin on his own. Lung cancer screening negative in June 2019. >5 minutes spent discussing the need for tobacco cessation.

## 2018-04-18 NOTE — Assessment & Plan Note (Signed)
Repeat A1C stable at 5.9.  Strongly advised he work on diet and get regular exercise. Continue to monitor.

## 2018-04-18 NOTE — Addendum Note (Signed)
Addended by: Jacqualin Combes on: 04/18/2018 12:48 PM   Modules accepted: Orders

## 2018-04-18 NOTE — Assessment & Plan Note (Signed)
Following with Urology. Referral placed to cardiology as recommended. 2+ DP and PT pulses. Has no symptoms of claudication.

## 2018-04-18 NOTE — Progress Notes (Signed)
Subjective:    Patient ID: Don Christian, male    DOB: 1956-02-20, 62 y.o.   MRN: [id number]  HPI  Don Christian is a 62 year old male who presents today for [place] Part 2. He saw our health advisor last week.   1) BPH/Erectile Dysfunction: Currently managed on tamsulosin 0.4 mg capsules and tadalafil 20 mg tablets. He is currently following with Urology. Recently followed with his Urologist who suggested he has a "blood flow problem" given inability to obtain a full erection. It was recommended he undergo cardiac evaluation. Family history of angina in his father, no known heart disease. He denies chest pain.  2) Insomnia/MDD: Currently managed on Trazodone 100 mg, previously managed onWellbutrin XL 300 mg. He is no longer taking Wellbutrin, stopped over one month ago, as he didn't find it was helpful.   3) COPD/Tobacco Abuse: Currently smoking 2-3 small cigars daily. He stopped taking Wellbutrin about one month ago as he didn't find it helpful. He is using his albuterol inhaler 1-2 times daily during the hot weather as he's been working in his yard. He typically uses his albuterol inhaler 1-2 times monthly. He underwent low dose CT chest in June 2019 for lung cancer screening which was negative.   BP Readings from Last 3 Encounters:  04/18/18 114/70  03/27/18 130/80  12/12/17 112/60      Immunizations: -Influenza: Completed last season  -Pneumonia: Never completed -Shingles: Completed in 2018  Colonoscopy: Completed in 2018 PSA: Negative in 2019 Hep C Screen: Negative in 2017  The 10-year ASCVD risk score Mikey Bussing DC Jr., et al., 2013) is: 12.6%   Values used to calculate the score:     Age: 53 years     Sex: Male     Is Non-Hispanic African American: No     Diabetic: No     Tobacco smoker: Yes     Systolic Blood Pressure: 194 mmHg     Is BP treated: No     HDL Cholesterol: 49.6 mg/dL     Total Cholesterol: 187 mg/dL   Review of Systems  Constitutional: Negative for  unexpected weight change.  HENT: Negative for rhinorrhea.   Respiratory: Negative for cough.        Intermittent shortness of breath  Cardiovascular: Negative for chest pain.  Gastrointestinal: Negative for constipation and diarrhea.  Genitourinary: Negative for difficulty urinating.       Erectile dysfunction  Musculoskeletal: Negative for arthralgias.  Skin: Negative for rash.  Neurological: Negative for headaches.  Psychiatric/Behavioral:       Denies concerns for anxiety and depression       Past Medical History:  Diagnosis Date  . BPH (benign prostatic hyperplasia) 09/16/2014  . Cataract    left eye  . Chronic back pain 09/16/2014  . Colon polyps   . COPD (chronic obstructive pulmonary disease) (HCC)    mild  . DDD (degenerative disc disease), lumbar 09/16/2014  . Full dentures   . Inguinal hernia 2004  . Insomnia 09/16/2014  . Personal history of tobacco use, presenting hazards to health 03/10/2016  . Prostate disorder   . Sleeping difficulties   . Tobacco use disorder 09/16/2014     Social History   Socioeconomic History  . Marital status: Married    Spouse name: Not on file  . Number of children: Not on file  . Years of education: Not on file  . Highest education level: Not on file  Occupational History  . Not on file  Social Needs  . Financial resource strain: Not on file  . Food insecurity:    Worry: Not on file    Inability: Not on file  . Transportation needs:    Medical: Not on file    Non-medical: Not on file  Tobacco Use  . Smoking status: Current Every Day Smoker    Years: 20.00    Types: Cigars  . Smokeless tobacco: Never Used  . Tobacco comment: 2 cigars daily   Substance and Sexual Activity  . Alcohol use: Yes    Alcohol/week: 0.0 oz    Comment: occassionally on social occassions  . Drug use: No  . Sexual activity: Yes  Lifestyle  . Physical activity:    Days per week: Not on file    Minutes per session: Not on file  . Stress: Not  on file  Relationships  . Social connections:    Talks on phone: Not on file    Gets together: Not on file    Attends religious service: Not on file    Active member of club or organization: Not on file    Attends meetings of clubs or organizations: Not on file    Relationship status: Not on file  . Intimate partner violence:    Fear of current or ex partner: Not on file    Emotionally abused: Not on file    Physically abused: Not on file    Forced sexual activity: Not on file  Other Topics Concern  . Not on file  Social History Narrative   ** Merged History Encounter **       Work or School: no work currently, disabled from back injury in 2012 Home Situation: lives with wife Spiritual Beliefs: none Lifestyle: walks dog 2x per day;  Healthy diet per his report. Enjoys spending time outdoors.       Past Surgical History:  Procedure Laterality Date  . ABDOMINAL EXPOSURE N/A 05/09/2015   Procedure: ABDOMINAL EXPOSURE;  Surgeon: Rosetta Posner, MD;  Location: [place] [facility];  Service: Vascular;  Laterality: N/A;  . ANTERIOR LUMBAR FUSION N/A 05/09/2015   Procedure: LUMBAR FOUR-FIVE ANTERIOR LUMBAR FUSION WITH ABDOMINAL EXPOSURE;  Surgeon: Eustace Moore, MD;  Location: [place] [facility];  Service: Neurosurgery;  Laterality: N/A;  . COLONOSCOPY W/ BIOPSIES AND POLYPECTOMY    . COLONOSCOPY WITH PROPOFOL N/A 03/18/2017   Procedure: COLONOSCOPY WITH PROPOFOL;  Surgeon: [name], MD;  Location: [facility];  Service: Endoscopy;  Laterality: N/A;  . GANGLION CYST EXCISION     left leg  . HERNIA REPAIR Left    inguinal at 15   . TOOTH EXTRACTION      Family History  Problem Relation Age of Onset  . Cancer Mother        Lung  . Heart disease Mother   . Cancer Father        Lung with mets to brain  . Cancer Sister        Lung    No Known Allergies  Current Outpatient Medications on File Prior to Visit  Medication Sig Dispense Refill  . albuterol (PROVENTIL HFA;VENTOLIN HFA) 108  (90 Base) MCG/ACT inhaler Inhale 2 puffs into the lungs every 4 (four) hours as needed for wheezing or shortness of breath. 1 Inhaler 2  . tadalafil (CIALIS) 20 MG tablet TK 1 T PO PRN  11  . tamsulosin (FLOMAX) 0.4 MG CAPS capsule TAKE 1 CAPSULE(0.4 MG) BY MOUTH DAILY 90 capsule 0  .  traZODone (DESYREL) 100 MG tablet TAKE 1 TABLET(100 MG) BY MOUTH AT BEDTIME AS NEEDED FOR SLEEP 30 tablet 5   No current facility-administered medications on file prior to visit.     BP 114/70   Pulse 72   Temp 97.8 F (36.6 C) (Oral)   Ht 6' (1.829 m)   Wt 145 lb 4 oz (65.9 kg)   SpO2 98%   BMI 19.70 kg/m    Objective:   Physical Exam  Constitutional: He is oriented to person, place, and time. He appears well-nourished.  HENT:  Mouth/Throat: No oropharyngeal exudate.  Eyes: Pupils are equal, round, and reactive to light. EOM are normal.  Neck: Neck supple. No thyromegaly present.  Cardiovascular: Normal rate and regular rhythm.  Pulses:      Dorsalis pedis pulses are 2+ on the right side, and 2+ on the left side.       Posterior tibial pulses are 2+ on the right side, and 2+ on the left side.  Respiratory: Effort normal and breath sounds normal.  GI: Soft. Bowel sounds are normal. There is no tenderness.  Musculoskeletal: Normal range of motion.  Neurological: He is alert and oriented to person, place, and time.  Skin: Skin is warm and dry.  Psychiatric: He has a normal mood and affect.           Assessment & Plan:

## 2018-04-18 NOTE — Assessment & Plan Note (Signed)
Doing well on Tamsulosin, following with Urology.

## 2018-04-18 NOTE — Assessment & Plan Note (Signed)
The 10-year ASCVD risk score Mikey Bussing DC Brooke Bonito., et al., 2013) is: 12.6%   Values used to calculate the score:     Age: 62 years     Sex: Male     Is Non-Hispanic African American: No     Diabetic: No     Tobacco smoker: Yes     Systolic Blood Pressure: 967 mmHg     Is BP treated: No     HDL Cholesterol: 49.6 mg/dL     Total Cholesterol: 187 mg/dL   Given history of tobacco abuse and prediabetes will initiate atorvastatin 10 mg daily. Repeat labs in 6 weeks.

## 2018-04-18 NOTE — Assessment & Plan Note (Signed)
Doing well on Trazodone 100 mg PRN, continue same.

## 2018-04-26 ENCOUNTER — Other Ambulatory Visit: Payer: Self-pay | Admitting: Primary Care

## 2018-04-26 DIAGNOSIS — G4700 Insomnia, unspecified: Secondary | ICD-10-CM

## 2018-05-16 ENCOUNTER — Other Ambulatory Visit: Payer: Self-pay | Admitting: Primary Care

## 2018-05-16 DIAGNOSIS — N400 Enlarged prostate without lower urinary tract symptoms: Secondary | ICD-10-CM

## 2018-05-17 ENCOUNTER — Other Ambulatory Visit: Payer: Self-pay | Admitting: Primary Care

## 2018-05-17 DIAGNOSIS — E785 Hyperlipidemia, unspecified: Secondary | ICD-10-CM

## 2018-05-30 ENCOUNTER — Other Ambulatory Visit (INDEPENDENT_AMBULATORY_CARE_PROVIDER_SITE_OTHER): Payer: Medicare Other

## 2018-05-30 DIAGNOSIS — E785 Hyperlipidemia, unspecified: Secondary | ICD-10-CM

## 2018-05-30 LAB — LIPID PANEL
CHOL/HDL RATIO: 3
Cholesterol: 131 mg/dL (ref 0–200)
HDL: 46.4 mg/dL (ref >39.00)
LDL Cholesterol: 74 mg/dL (ref 0–99)
NONHDL: 84.63
Triglycerides: 52 mg/dL (ref 0.0–149.0)
VLDL: 10.4 mg/dL (ref 0.0–40.0)

## 2018-05-30 LAB — HEPATIC FUNCTION PANEL
ALT: 14 U/L (ref 0–53)
AST: 14 U/L (ref 0–37)
Albumin: 4.2 g/dL (ref 3.5–5.2)
Alkaline Phosphatase: 65 U/L (ref 39–117)
BILIRUBIN DIRECT: 0.1 mg/dL (ref 0.0–0.3)
BILIRUBIN TOTAL: 0.4 mg/dL (ref 0.2–1.2)
Total Protein: 6.9 g/dL (ref 6.0–8.3)

## 2018-06-18 NOTE — Progress Notes (Deleted)
Cardiology Office Note  Date:  06/18/2018   ID:  Don Christian, DOB 1956/01/22, MRN [id number]  PCP:  Pleas Koch, NP   No chief complaint on file.   HPI:   Insomnia, MDD ED COPD/smoker Referred by Alma Friendly for aortic atherosclerosis  CT chest  03/2018 03/2017 03/2016 01/2015    PMH:   has a past medical history of BPH (benign prostatic hyperplasia) (09/16/2014), Cataract, Chronic back pain (09/16/2014), Colon polyps, COPD (chronic obstructive pulmonary disease) ([place]), DDD (degenerative disc disease), lumbar (09/16/2014), Full dentures, Inguinal hernia (2004), Insomnia (09/16/2014), Personal history of tobacco use, presenting hazards to health (03/10/2016), Prostate disorder, Sleeping difficulties, and Tobacco use disorder (09/16/2014).  PSH:    Past Surgical History:  Procedure Laterality Date  . ABDOMINAL EXPOSURE N/A 05/09/2015   Procedure: ABDOMINAL EXPOSURE;  Surgeon: Rosetta Posner, MD;  Location: [place] [facility]UR[facility] ORS;  Service: Vascular;  Laterality: N/A;  . ANTERIOR LUMBAR FUSION N/A 05/09/2015   Procedure: LUMBAR FOUR-FIVE ANTERIOR LUMBAR FUSION WITH ABDOMINAL EXPOSURE;  Surgeon: Eustace Moore, MD;  Location: [place] NEUR[facility] ORS;  Service: Neurosurgery;  Laterality: N/A;  . COLONOSCOPY W/ BIOPSIES AND POLYPECTOMY    . COLONOSCOPY WITH PROPOFOL N/A 03/18/2017   Procedure: COLONOSCOPY WITH PROPOFOL;  Surgeon: [name], MD;  Location[facility];  Service: Endoscopy;  Laterality: N/A;  . GANGLION CYST EXCISION     left leg  . HERNIA REPAIR Left    inguinal at 15   . TOOTH EXTRACTION      Current Outpatient Medications  Medication Sig Dispense Refill  . albuterol (PROVENTIL HFA;VENTOLIN HFA) 108 (90 Base) MCG/ACT inhaler Inhale 2 puffs into the lungs every 4 (four) hours as needed for wheezing or shortness of breath. 1 Inhaler 2  . atorvastatin (LIPITOR) 10 MG tablet Take 1 tablet by mouth every evening for cholesterol. 90 tablet 3  . tadalafil (CIALIS) 20 MG tablet TK 1  T PO PRN  11  . tamsulosin (FLOMAX) 0.4 MG CAPS capsule TAKE 1 CAPSULE(0.4 MG) BY MOUTH DAILY 90 capsule 0  . traZODone (DESYREL) 100 MG tablet TAKE 1 TABLET(100 MG) BY MOUTH AT BEDTIME AS NEEDED FOR SLEEP 30 tablet 2   No current facility-administered medications for this visit.      Allergies:   Patient has no known allergies.   Social History:  The patient  reports that he has been smoking cigars. He has smoked for the past 20.00 years. He has never used smokeless tobacco. He reports that he drinks alcohol. He reports that he does not use drugs.   Family History:   family history includes Cancer in his father, mother, and sister; Heart disease in his mother.    Review of Systems: ROS   PHYSICAL EXAM: VS:  There were no vitals taken for this visit. , BMI There is no height or weight on file to calculate BMI. GEN: Well nourished, well developed, in no acute distress HEENT: normal Neck: no JVD, carotid bruits, or masses Cardiac: RRR; no murmurs, rubs, or gallops,no edema  Respiratory:  clear to auscultation bilaterally, normal work of breathing GI: soft, nontender, nondistended, + BS MS: no deformity or atrophy Skin: warm and dry, no rash Neuro:  Strength and sensation are intact Psych: euthymic mood, full affect    Recent Labs: 03/27/2018: BUN 15; Creatinine, Ser 0.86; Potassium 4.5; Sodium 137 05/30/2018: ALT 14    Lipid Panel Lab Results  Component Value Date   CHOL 131 05/30/2018   HDL 46.40 05/30/2018  LDLCALC 74 05/30/2018   TRIG 52.0 05/30/2018      Wt Readings from Last 3 Encounters:  04/18/18 145 lb 4 oz (65.9 kg)  03/27/18 148 lb 8 oz (67.4 kg)  03/22/18 156 lb (70.8 kg)       ASSESSMENT AND PLAN:  No diagnosis found.   Disposition:   F/U  6 months  No orders of the defined types were placed in this encounter.    Signed, Esmond Plants, M.D., Ph.D. 06/18/2018  [place], [place]

## 2018-06-19 ENCOUNTER — Ambulatory Visit: Payer: Self-pay | Admitting: Cardiovascular Disease

## 2018-06-19 NOTE — Progress Notes (Signed)
Cardiology Office Note  Date:  06/20/2018   ID:  Don Christian, DOB Jun 26, 1956, MRN [id number]  PCP:  Pleas Koch, NP   Chief Complaint  Patient presents with  . other    Aortic atherosclerosis. Dizziness and tingling in fingers in left hand in the tips. Medications reviewed verbally.     HPI:  [name] is a 62 year old gentleman with past medical history of Insomnia, MDD Erectile dysfunction COPD/smoker Referred by Alma Friendly for aortic atherosclerosis, dizziness  He has tried Cialis with minimal improvement of his ED Concerned about vascular problem causing his symptoms  Several CT scans over the past several years pulled up in the office today Images reviewed with him in detail CT chest  03/2018 03/2017 03/2016 01/2015  Scans show mild aortic arch atherosclerosis, very mild aortic atherosclerosis in the descending aorta, no significant coronary calcification noted  Rare dizziness Orthostatics done in the office today showing climbing heart rate 20 points with supine to standing position Drop in blood pressure 341 systolic down to 95 systolic after 3 minutes of standing  EKG personally reviewed by myself on todays visit Shows normal sinus rhythm rate 65 bpm no significant ST or T wave changes  PMH:   has a past medical history of BPH (benign prostatic hyperplasia) (09/16/2014), Cataract, Chronic back pain (09/16/2014), Colon polyps, COPD (chronic obstructive pulmonary disease) ([place]), DDD (degenerative disc disease), lumbar (09/16/2014), Full dentures, Inguinal hernia (2004), Insomnia (09/16/2014), Personal history of tobacco use, presenting hazards to health (03/10/2016), Prostate disorder, Sleeping difficulties, and Tobacco use disorder (09/16/2014).  PSH:    Past Surgical History:  Procedure Laterality Date  . ABDOMINAL EXPOSURE N/A 05/09/2015   Procedure: ABDOMINAL EXPOSURE;  Surgeon: Rosetta Posner, MD;  Location: [place] NEUR[facility] ORS;  Service: Vascular;   Laterality: N/A;  . ANTERIOR LUMBAR FUSION N/A 05/09/2015   Procedure: LUMBAR FOUR-FIVE ANTERIOR LUMBAR FUSION WITH ABDOMINAL EXPOSURE;  Surgeon: Eustace Moore, MD;  Location: [place] NEUR[facility] ORS;  Service: Neurosurgery;  Laterality: N/A;  . COLONOSCOPY W/ BIOPSIES AND POLYPECTOMY    . COLONOSCOPY WITH PROPOFOL N/A 03/18/2017   Procedure: COLONOSCOPY WITH PROPOFOL;  Surgeon: [name], MD;  Location: [facility] ENDOSCOPY;  Service: Endoscopy;  Laterality: N/A;  . GANGLION CYST EXCISION     left leg  . HERNIA REPAIR Left    inguinal at 15   . TOOTH EXTRACTION      Current Outpatient Medications  Medication Sig Dispense Refill  . albuterol (PROVENTIL HFA;VENTOLIN HFA) 108 (90 Base) MCG/ACT inhaler Inhale 2 puffs into the lungs every 4 (four) hours as needed for wheezing or shortness of breath. 1 Inhaler 2  . atorvastatin (LIPITOR) 10 MG tablet Take 1 tablet by mouth every evening for cholesterol. 90 tablet 3  . tadalafil (CIALIS) 20 MG tablet TK 1 T PO PRN  11  . tamsulosin (FLOMAX) 0.4 MG CAPS capsule TAKE 1 CAPSULE(0.4 MG) BY MOUTH DAILY 90 capsule 0  . traZODone (DESYREL) 100 MG tablet TAKE 1 TABLET(100 MG) BY MOUTH AT BEDTIME AS NEEDED FOR SLEEP 30 tablet 2   No current facility-administered medications for this visit.      Allergies:   Patient has no known allergies.   Social History:  The patient  reports that he has been smoking cigars. He has smoked for the past 20.00 years. He has never used smokeless tobacco. He reports that he drinks alcohol. He reports that he does not use drugs.   Family History:   family history  includes Cancer in his father, mother, and sister; Heart disease in his mother.    Review of Systems: Review of Systems  Constitutional: Negative.   Respiratory: Negative.   Cardiovascular: Negative.   Gastrointestinal: Negative.   Musculoskeletal: Negative.   Neurological: Positive for dizziness.  Psychiatric/Behavioral: Negative.   All other systems reviewed and are  negative.    PHYSICAL EXAM: VS:  BP 118/62 (BP Location: Right Arm, Patient Position: Sitting, Cuff Size: Normal)   Pulse 65   Ht 6' (1.829 m)   Wt 145 lb (65.8 kg)   BMI 19.67 kg/m   , BMI Body mass index is 19.67 kg/m. GEN: Well nourished, well developed, in no acute distress  HEENT: normal  Neck: no JVD, carotid bruits, or masses Cardiac: RRR; no murmurs, rubs, or gallops,no edema  Respiratory:  clear to auscultation bilaterally, normal work of breathing GI: soft, nontender, nondistended, + BS MS: no deformity or atrophy  Skin: warm and dry, no rash Neuro:  Strength and sensation are intact Psych: euthymic mood, full affect   Recent Labs: 03/27/2018: BUN 15; Creatinine, Ser 0.86; Potassium 4.5; Sodium 137 05/30/2018: ALT 14    Lipid Panel Lab Results  Component Value Date   CHOL 131 05/30/2018   HDL 46.40 05/30/2018   LDLCALC 74 05/30/2018   TRIG 52.0 05/30/2018      Wt Readings from Last 3 Encounters:  06/20/18 145 lb (65.8 kg)  04/18/18 145 lb 4 oz (65.9 kg)  03/27/18 148 lb 8 oz (67.4 kg)       ASSESSMENT AND PLAN:  Aortic atherosclerosis (HCC) - Plan: EKG 12-Lead Mild aortic atherosclerosis noted Non-smoker for the past month, cholesterol at goal, no diabetes No further work-up needed at this time No significant coronary calcification noted  Mixed hyperlipidemia Cholesterol is at goal on the current lipid regimen. No changes to the medications were made.  Orthostasis 25 point drop in his blood pressure from supine position to standing in the setting of periodic dizziness ("sometimes drops me to my knees") Likely secondary to his Flomax.  He takes 20 mg every morning Recommended he decrease the dose down to 10 mg daily, monitor for symptoms If he continues to have symptoms pain need to take in the evening  Tobacco use disorder - Plan: EKG 12-Lead We have encouraged him to continue to work on weaning his cigarettes and smoking cessation. He will  continue to work on this and does not want any assistance with chantix.   Prediabetes We have encouraged continued exercise, careful diet management    Disposition:   F/U as needed   Total encounter time more than 60 minutes  Greater than 50% was spent in counseling and coordination of care with the patient  Patient was seen in consultation for Alma Friendly and will be referred back to her office for ongoing care of the issues detailed above   Orders Placed This Encounter  Procedures  . EKG 12-Lead     Signed, Esmond Plants, M.D., Ph.D. 06/20/2018  [place], [place]

## 2018-06-20 ENCOUNTER — Ambulatory Visit (INDEPENDENT_AMBULATORY_CARE_PROVIDER_SITE_OTHER): Payer: Medicare Other | Admitting: Cardiovascular Disease

## 2018-06-20 ENCOUNTER — Encounter: Payer: Self-pay | Admitting: Cardiovascular Disease

## 2018-06-20 VITALS — BP 118/62 | HR 65 | Ht 72.0 in | Wt 145.0 lb

## 2018-06-20 DIAGNOSIS — F17200 Nicotine dependence, unspecified, uncomplicated: Secondary | ICD-10-CM | POA: Diagnosis not present

## 2018-06-20 DIAGNOSIS — I951 Orthostatic hypotension: Secondary | ICD-10-CM | POA: Diagnosis not present

## 2018-06-20 DIAGNOSIS — R7303 Prediabetes: Secondary | ICD-10-CM

## 2018-06-20 DIAGNOSIS — E782 Mixed hyperlipidemia: Secondary | ICD-10-CM | POA: Diagnosis not present

## 2018-06-20 DIAGNOSIS — I700 Atherosclerosis of aorta: Secondary | ICD-10-CM

## 2018-06-20 NOTE — Patient Instructions (Addendum)

## 2018-07-27 DIAGNOSIS — Z23 Encounter for immunization: Secondary | ICD-10-CM | POA: Diagnosis not present

## 2018-09-11 ENCOUNTER — Other Ambulatory Visit: Payer: Self-pay | Admitting: Primary Care

## 2018-09-11 DIAGNOSIS — G4700 Insomnia, unspecified: Secondary | ICD-10-CM

## 2018-12-11 ENCOUNTER — Telehealth: Payer: Self-pay | Admitting: Primary Care

## 2018-12-11 DIAGNOSIS — F17200 Nicotine dependence, unspecified, uncomplicated: Secondary | ICD-10-CM

## 2018-12-11 MED ORDER — BUPROPION HCL ER (SR) 150 MG PO TB12: 150.0000 mg | ORAL_TABLET | Freq: Two times a day (BID) | ORAL | 0 refills | Status: DC

## 2018-12-11 NOTE — Telephone Encounter (Signed)
Patient said he use to take Bupropion to quit smoking.  Patient would like to start the medication, again.  Patient would like to take Bupropion 150 mg 2 x a day.  He asked for Vallarie Mare to call him back, so he can discuss it with her.  Patient uses [facility].

## 2018-12-11 NOTE — Telephone Encounter (Signed)
Message left for patient to return my call.  

## 2018-12-11 NOTE — Telephone Encounter (Signed)
Noted.  Prescription sent to pharmacy. 

## 2018-12-11 NOTE — Telephone Encounter (Signed)
Spoken to patient. He stated that to ask for Anda Kraft to restart him on the Bupropion 150 mg BID which is the same as his wife Sharyn Lull.

## 2019-03-26 ENCOUNTER — Other Ambulatory Visit: Payer: Self-pay | Admitting: Primary Care

## 2019-03-26 DIAGNOSIS — I700 Atherosclerosis of aorta: Secondary | ICD-10-CM

## 2019-03-26 DIAGNOSIS — R7303 Prediabetes: Secondary | ICD-10-CM

## 2019-03-26 DIAGNOSIS — E782 Mixed hyperlipidemia: Secondary | ICD-10-CM

## 2019-03-30 ENCOUNTER — Other Ambulatory Visit: Payer: Self-pay

## 2019-03-30 ENCOUNTER — Other Ambulatory Visit (INDEPENDENT_AMBULATORY_CARE_PROVIDER_SITE_OTHER): Payer: Medicare Other

## 2019-03-30 ENCOUNTER — Ambulatory Visit: Payer: Medicare Other

## 2019-03-30 DIAGNOSIS — E782 Mixed hyperlipidemia: Secondary | ICD-10-CM

## 2019-03-30 DIAGNOSIS — Z0000 Encounter for general adult medical examination without abnormal findings: Secondary | ICD-10-CM

## 2019-03-30 DIAGNOSIS — R7303 Prediabetes: Secondary | ICD-10-CM

## 2019-03-30 DIAGNOSIS — I700 Atherosclerosis of aorta: Secondary | ICD-10-CM | POA: Diagnosis not present

## 2019-03-30 LAB — COMPREHENSIVE METABOLIC PANEL
ALT: 10 U/L (ref 0–53)
AST: 12 U/L (ref 0–37)
Albumin: 4.2 g/dL (ref 3.5–5.2)
Alkaline Phosphatase: 66 U/L (ref 39–117)
BUN: 11 mg/dL (ref 6–23)
CO2: 30 mEq/L (ref 19–32)
Calcium: 9.5 mg/dL (ref 8.4–10.5)
Chloride: 102 mEq/L (ref 96–112)
Creatinine, Ser: 0.97 mg/dL (ref 0.40–1.50)
GFR: 77.9 mL/min (ref >60.00)
Glucose, Bld: 92 mg/dL (ref 70–99)
Potassium: 4.7 mEq/L (ref 3.5–5.1)
Sodium: 138 mEq/L (ref 135–145)
Total Bilirubin: 0.6 mg/dL (ref 0.2–1.2)
Total Protein: 6.3 g/dL (ref 6.0–8.3)

## 2019-03-30 LAB — LIPID PANEL
Cholesterol: 136 mg/dL (ref 0–200)
HDL: 54.2 mg/dL (ref >39.00)
LDL Cholesterol: 73 mg/dL (ref 0–99)
NonHDL: 81.95
Total CHOL/HDL Ratio: 3
Triglycerides: 46 mg/dL (ref 0.0–149.0)
VLDL: 9.2 mg/dL (ref 0.0–40.0)

## 2019-03-30 LAB — HEMOGLOBIN A1C: Hgb A1c MFr Bld: 5.6 % (ref 4.6–6.5)

## 2019-03-30 NOTE — Progress Notes (Signed)
Subjective:   Don Christian is a 63 y.o. male who presents for Medicare Annual (Subsequent) preventive examination.  Review of Systems:  N/A Cardiac Risk Factors include: advanced age (>29men, >37 women);male gender     Objective:     Vitals: There were no vitals taken for this visit.  There is no height or weight on file to calculate BMI.  Advanced Directives 03/30/2019 03/27/2018 03/18/2017 01/18/2017 05/09/2015 05/07/2015  Does Patient Have a Medical Advance Directive? No No No No Yes No  Type of Advance Directive - - - - Press photographer;Living will -  Would patient like information on creating a medical advance directive? No - Patient declined Yes (MAU/Ambulatory/Procedural Areas - Information given) - - - Yes - Educational materials given    Tobacco Social History   Tobacco Use  Smoking Status Former Smoker   Years: 20.00   Types: Cigars   Quit date: 11/04/2018   Years since quitting: 0.4  Smokeless Tobacco Never Used     Counseling given: No   Clinical Intake:  Pre-visit preparation completed: Yes  Pain : No/denies pain Pain Score: 0-No pain     Nutritional Status: BMI of 19-24  Normal Nutritional Risks: None Diabetes: No  How often do you need to have someone help you when you read instructions, pamphlets, or other written materials from your doctor or pharmacy?: 1 - Never What is the last grade level you completed in school?: GED  Interpreter Needed?: No  Comments: pt lives with spouse Information entered by :: LPinson, RN  Past Medical History:  Diagnosis Date   BPH (benign prostatic hyperplasia) 09/16/2014   Cataract    left eye   Chronic back pain 09/16/2014   Colon polyps    COPD (chronic obstructive pulmonary disease) ([place])    mild   DDD (degenerative disc disease), lumbar 09/16/2014   Full dentures    Inguinal hernia 2004   Insomnia 09/16/2014   Personal history of tobacco use, presenting hazards to health 03/10/2016    Prostate disorder    Sleeping difficulties    Tobacco use disorder 09/16/2014   Past Surgical History:  Procedure Laterality Date   ABDOMINAL EXPOSURE N/A 05/09/2015   Procedure: ABDOMINAL EXPOSURE;  Surgeon: Rosetta Posner, MD;  Location: [facility]UR[facility] ORS;  Service: Vascular;  Laterality: N/A;   ANTERIOR LUMBAR FUSION N/A 05/09/2015   Procedure: LUMBAR FOUR-FIVE ANTERIOR LUMBAR FUSION WITH ABDOMINAL EXPOSURE;  Surgeon: Eustace Moore, MD;  Location: [facility];  Service: Neurosurgery;  Laterality: N/A;   COLONOSCOPY W/ BIOPSIES AND POLYPECTOMY     COLONOSCOPY WITH PROPOFOL N/A 03/18/2017   Procedure: COLONOSCOPY WITH PROPOFOL;  Surgeon: [name], MD;  Location: [facility] ENDOSCOPY;  Service: Endoscopy;  Laterality: N/A;   GANGLION CYST EXCISION     left leg   HERNIA REPAIR Left    inguinal at 76    TOOTH EXTRACTION     Family History  Problem Relation Age of Onset   Cancer Mother        Lung   Heart disease Mother    Cancer Father        Lung with mets to brain   Cancer Sister        Lung   Social History   Socioeconomic History   Marital status: Married    Spouse name: Not on file   Number of children: Not on file   Years of education: Not on file   Highest education level: Not  on file  Occupational History   Not on file  Social Needs   Financial resource strain: Not on file   Food insecurity    Worry: Not on file    Inability: Not on file   Transportation needs    Medical: Not on file    Non-medical: Not on file  Tobacco Use   Smoking status: Former Smoker    Years: 20.00    Types: Cigars    Quit date: 11/04/2018    Years since quitting: 0.4   Smokeless tobacco: Never Used  Substance and Sexual Activity   Alcohol use: Yes    Alcohol/week: 0.0 standard drinks    Comment: occassionally on social occassions   Drug use: No   Sexual activity: Yes  Lifestyle   Physical activity    Days per week: Not on file    Minutes per session: Not on  file   Stress: Not on file  Relationships   Social connections    Talks on phone: Not on file    Gets together: Not on file    Attends religious service: Not on file    Active member of club or organization: Not on file    Attends meetings of clubs or organizations: Not on file    Relationship status: Not on file  Other Topics Concern   Not on file  Social History Narrative   ** Merged History Encounter **       Work or School: no work currently, disabled from back injury in 2012 Home Situation: lives with wife Spiritual Beliefs: none Lifestyle: walks dog 2x per day;  Healthy diet per his report. Enjoys spending time outdoors.       Outpatient Encounter Medications as of 03/30/2019  Medication Sig   albuterol (PROVENTIL HFA;VENTOLIN HFA) 108 (90 Base) MCG/ACT inhaler Inhale 2 puffs into the lungs every 4 (four) hours as needed for wheezing or shortness of breath.   atorvastatin (LIPITOR) 10 MG tablet Take 1 tablet by mouth every evening for cholesterol.   buPROPion (WELLBUTRIN SR) 150 MG 12 hr tablet Take 1 tablet (150 mg total) by mouth 2 (two) times daily. For smoking cessation.   tadalafil (CIALIS) 20 MG tablet TK 1 T PO PRN   tamsulosin (FLOMAX) 0.4 MG CAPS capsule TAKE 1 CAPSULE(0.4 MG) BY MOUTH DAILY   traZODone (DESYREL) 100 MG tablet TAKE 1 TABLET(100 MG) BY MOUTH AT BEDTIME AS NEEDED FOR SLEEP   No facility-administered encounter medications on file as of 03/30/2019.     Activities of Daily Living In your present state of health, do you have any difficulty performing the following activities: 03/30/2019  Hearing? N  Vision? N  Difficulty concentrating or making decisions? N  Walking or climbing stairs? N  Dressing or bathing? N  Doing errands, shopping? N  Preparing Food and eating ? N  Using the Toilet? N  In the past six months, have you accidently leaked urine? N  Do you have problems with loss of bowel control? N  Managing your Medications? N    Managing your Finances? N  Housekeeping or managing your Housekeeping? N  Some recent data might be hidden    Patient Care Team: Pleas Koch, NP as PCP - General (Internal Medicine) Pleas Koch, NP (Nurse Practitioner) Robert Bellow, MD (General Surgery)    Assessment:   This is a routine wellness examination for Don Christian.   Hearing Screening   125Hz  250Hz  500Hz  1000Hz  2000Hz  3000Hz  4000Hz  6000Hz  8000Hz   Right ear:           Left ear:           Vision Screening Comments: Vision exam in 2019 with Dr. Maryruth Hancock B.   Exercise Activities and Dietary recommendations Current Exercise Habits: Home exercise routine, Type of exercise: walking, Time (Minutes): 30, Frequency (Times/Week): 7, Weekly Exercise (Minutes/Week): 210, Intensity: Moderate, Exercise limited by: None identified  Goals     Increase physical activity     Starting 03/27/2018, I will continue to exercise for at least 30 minutes daily.        Fall Risk Fall Risk  03/30/2019 03/27/2018 01/18/2017  Falls in the past year? 0 No No   Depression Screen PHQ 2/9 Scores 03/30/2019 03/27/2018 01/18/2017 01/27/2016  PHQ - 2 Score 0 0 0 4  PHQ- 9 Score 0 0 - 10     Cognitive Function MMSE - Mini Mental State Exam 03/30/2019 03/27/2018 01/18/2017  Orientation to time 5 5 5   Orientation to Place 5 5 5   Registration 3 3 3   Attention/ Calculation 0 0 0  Recall 3 3 3   Language- name 2 objects 0 0 0  Language- repeat 1 1 1   Language- follow 3 step command 0 3 3  Language- read & follow direction 0 0 0  Write a sentence 0 0 0  Copy design 0 0 0  Total score 17 20 20        PLEASE NOTE: A Mini-Cog screen was completed. Maximum score is 17. A value of 0 denotes this part of Folstein MMSE was not completed or the patient failed this part of the Mini-Cog screening.   Mini-Cog Screening Orientation to Time - Max 5 pts Orientation to Place - Max 5 pts Registration - Max 3 pts Recall - Max 3 pts Language Repeat -  Max 1 pts    Immunization History  Administered Date(s) Administered   Influenza Inj Mdck Quad Pf 07/27/2018   Influenza,inj,Quad PF,6+ Mos 09/16/2014, 08/11/2015, 07/12/2017   Pneumococcal Polysaccharide-23 04/18/2018   Zoster Recombinat (Shingrix) 01/29/2017    Screening Tests Health Maintenance  Topic Date Due   INFLUENZA VACCINE  05/05/2019   TETANUS/TDAP  10/05/2019   COLONOSCOPY  03/19/2027   Hepatitis C Screening  Completed   HIV Screening  Completed      Plan:     I have personally reviewed, addressed, and noted the following in the patients chart:  A. Medical and social history B. Use of alcohol, tobacco or illicit drugs  C. Current medications and supplements D. Functional ability and status E.  Nutritional status F.  Physical activity G. Advance directives H. List of other physicians I.  Hospitalizations, surgeries, and ER visits in previous 12 months J.  Vitals (unless it is a telemedicine encounter) K. Screenings to include cognitive, depression, hearing, vision (NOTE: hearing and vision screenings not completed in telemedicine encounter) L. Referrals and appointments   In addition, I have reviewed and discussed with patient certain preventive protocols, quality metrics, and best practice recommendations. A written personalized care plan for preventive services and recommendations were provided to patient.  With patient's permission, we connected on 03/30/19 at  8:00 AM EDT. Interactive audio and video telecommunications were attempted with patient. This attempt was unsuccessful due to patient having technical difficulties OR patient did not have access to video capability.  Encounter was completed with audio only.  Two patient identifiers were used to ensure the encounter occurred with the correct person. Patient was in  home and writer was in office.     Signed,   Lindell Noe, MHA, BS, RN Health Coach

## 2019-03-30 NOTE — Progress Notes (Signed)
PCP notes:   Health maintenance:  None  Abnormal screenings:   None  Patient concerns:   Intermittent left side abdominal pain - states it is from scar tissue  Nurse concerns:  None  Next PCP appt:   04/02/19 @ 1200

## 2019-03-30 NOTE — Patient Instructions (Signed)
Don Christian , Thank you for taking time to come for your Medicare Wellness Visit. I appreciate your ongoing commitment to your health goals. Please review the following plan we discussed and let me know if I can assist you in the future.   These are the goals we discussed: Goals    . Increase physical activity     Starting 03/27/2018, I will continue to exercise for at least 30 minutes daily.        This is a list of the screening recommended for you and due dates:  Health Maintenance  Topic Date Due  . Flu Shot  05/05/2019  . Tetanus Vaccine  10/05/2019  . Colon Cancer Screening  03/19/2027  .  Hepatitis C: One time screening is recommended by Center for Disease Control  (CDC) for  adults born from [birth year] through 1966.   Completed  . HIV Screening  Completed   Preventive Care for Adults  A healthy lifestyle and preventive care can promote health and wellness. Preventive health guidelines for adults include the following key practices.  . A routine yearly physical is a good way to check with your health care provider about your health and preventive screening. It is a chance to share any concerns and updates on your health and to receive a thorough exam.  . Visit your dentist for a routine exam and preventive care every 6 months. Brush your teeth twice a day and floss once a day. Good oral hygiene prevents tooth decay and gum disease.  . The frequency of eye exams is based on your age, health, family medical history, use  of contact lenses, and other factors. Follow your health care provider's recommendations for frequency of eye exams.  . Eat a healthy diet. Foods like vegetables, fruits, whole grains, low-fat dairy products, and lean protein foods contain the nutrients you need without too many calories. Decrease your intake of foods high in solid fats, added sugars, and salt. Eat the right amount of calories for you. Get information about a proper diet from your health care provider,  if necessary.  . Regular physical exercise is one of the most important things you can do for your health. Most adults should get at least 150 minutes of moderate-intensity exercise (any activity that increases your heart rate and causes you to sweat) each week. In addition, most adults need muscle-strengthening exercises on 2 or more days a week.  Silver Sneakers may be a benefit available to you. To determine eligibility, you may visit the website: www.silversneakers.com or contact program at [phone] Mon-Fri between 8AM-8PM.   . Maintain a healthy weight. The body mass index (BMI) is a screening tool to identify possible weight problems. It provides an estimate of body fat based on height and weight. Your health care provider can find your BMI and can help you achieve or maintain a healthy weight.   For adults 20 years and older: ? A BMI below 18.5 is considered underweight. ? A BMI of 18.5 to 24.9 is normal. ? A BMI of 25 to 29.9 is considered overweight. ? A BMI of 30 and above is considered obese.   . Maintain normal blood lipids and cholesterol levels by exercising and minimizing your intake of saturated fat. Eat a balanced diet with plenty of fruit and vegetables. Blood tests for lipids and cholesterol should begin at age 45 and be repeated every 5 years. If your lipid or cholesterol levels are high, you are over 50, or you are  at high risk for heart disease, you may need your cholesterol levels checked more frequently. Ongoing high lipid and cholesterol levels should be treated with medicines if diet and exercise are not working.  . If you smoke, find out from your health care provider how to quit. If you do not use tobacco, please do not start.  . If you choose to drink alcohol, please do not consume more than 2 drinks per day. One drink is considered to be 12 ounces (355 mL) of beer, 5 ounces (148 mL) of wine, or 1.5 ounces (44 mL) of liquor.  . If you are 61-23 years old, ask  your health care provider if you should take aspirin to prevent strokes.  . Use sunscreen. Apply sunscreen liberally and repeatedly throughout the day. You should seek shade when your shadow is shorter than you. Protect yourself by wearing long sleeves, pants, a wide-brimmed hat, and sunglasses year round, whenever you are outdoors.  . Once a month, do a whole body skin exam, using a mirror to look at the skin on your back. Tell your health care provider of new moles, moles that have irregular borders, moles that are larger than a pencil eraser, or moles that have changed in shape or color.

## 2019-04-02 ENCOUNTER — Ambulatory Visit: Payer: Self-pay

## 2019-04-02 ENCOUNTER — Encounter: Payer: Self-pay | Admitting: Primary Care

## 2019-04-02 ENCOUNTER — Other Ambulatory Visit: Payer: Self-pay

## 2019-04-02 ENCOUNTER — Ambulatory Visit (INDEPENDENT_AMBULATORY_CARE_PROVIDER_SITE_OTHER): Payer: Medicare Other | Admitting: Primary Care

## 2019-04-02 VITALS — BP 130/76 | HR 60 | Temp 98.5°F | Ht 72.0 in | Wt 139.2 lb

## 2019-04-02 DIAGNOSIS — I700 Atherosclerosis of aorta: Secondary | ICD-10-CM

## 2019-04-02 DIAGNOSIS — G8929 Other chronic pain: Secondary | ICD-10-CM

## 2019-04-02 DIAGNOSIS — E782 Mixed hyperlipidemia: Secondary | ICD-10-CM

## 2019-04-02 DIAGNOSIS — R7303 Prediabetes: Secondary | ICD-10-CM

## 2019-04-02 DIAGNOSIS — R1032 Left lower quadrant pain: Secondary | ICD-10-CM | POA: Diagnosis not present

## 2019-04-02 DIAGNOSIS — F17200 Nicotine dependence, unspecified, uncomplicated: Secondary | ICD-10-CM

## 2019-04-02 DIAGNOSIS — F3342 Major depressive disorder, recurrent, in full remission: Secondary | ICD-10-CM

## 2019-04-02 DIAGNOSIS — M79671 Pain in right foot: Secondary | ICD-10-CM

## 2019-04-02 DIAGNOSIS — M5136 Other intervertebral disc degeneration, lumbar region: Secondary | ICD-10-CM

## 2019-04-02 DIAGNOSIS — Z0000 Encounter for general adult medical examination without abnormal findings: Secondary | ICD-10-CM

## 2019-04-02 NOTE — Assessment & Plan Note (Signed)
Stopped smoking 4 months ago, commended him on this. Due for lung cancer screening this Summer.

## 2019-04-02 NOTE — Assessment & Plan Note (Signed)
Immunizations UTD. PSA UTD. Colonoscopy UTD. Lung cancer screening to be done this Summer. Exam with chronic problems mentioned in HPI. Labs reviewed. Follow up in 1 year for CPE.

## 2019-04-02 NOTE — Progress Notes (Signed)
I reviewed health advisor's note, was available for consultation, and agree with documentation and plan.  

## 2019-04-02 NOTE — Progress Notes (Signed)
   Subjective:    Patient ID: Don Christian, male    DOB: [birth year], 63 y.o.   MRN: [id number]  HPI  Don Christian is a 63 year old male who presents today for complete physical. He would also like to discuss chronic foot pain and chronic left lower quadrant abdominal pain.  Chronic left inguinal abdominal pain for the last 2-3 years, worse over the last one year. He was evaluated by surgeon last year who endorsed that his symptoms were from scar tissue from hernia repair in 2004. He denies nausea, vomiting. He has lost some weight recently, is active in the yard.  Chronic right first metatarsal joint pain and swelling for years. He underwent xray of the foot in February 2019 which showed moderate degenerative disease. He was not tested for gout. He denies erythema, increased swelling, fevers.    Immunizations: -Influenza: Due this season  -Pneumonia: Completed in 2019 -Shingles: Completed Shingrix  Diet: He endorses a fair diet. He is eating home cooked meals with protein, vegetables, starch. He is eating desserts daily. Drinking mostly water, coffee.  Exercise: He is active in the yard, walking twice daily   Eye exam: Completed in 2019 Dental exam: Completes semi-annually  Colonoscopy: Completed in 2018, due in 2023 PSA: 2.10 in 2019 Hep C Screen: Negative Lung Cancer Screening: Due, pending  Wt Readings from Last 3 Encounters:  04/02/19 139 lb 4 oz (63.2 kg)  06/20/18 145 lb (65.8 kg)  04/18/18 145 lb 4 oz (65.9 kg)   BP Readings from Last 3 Encounters:  04/02/19 130/76  06/20/18 118/62  04/18/18 114/70     Review of Systems  Constitutional: Negative for unexpected weight change.  HENT: Negative for rhinorrhea.   Respiratory: Negative for cough and shortness of breath.   Cardiovascular: Negative for chest pain.  Gastrointestinal: Negative for constipation and diarrhea.  Genitourinary: Negative for difficulty urinating.       Chronic left inguinal pain to the site of  hernia repair, progressive pain for the last one year.  Musculoskeletal: Positive for arthralgias and back pain.       Chronic right first metatarsal joint pain and swelling  Skin: Negative for rash.  Allergic/Immunologic: Positive for environmental allergies.  Neurological: Negative for dizziness, numbness and headaches.  Psychiatric/Behavioral: The patient is not nervous/anxious.        Objective:   Physical Exam  Constitutional: He is oriented to person, place, and time. He appears well-nourished.  HENT:  Mouth/Throat: No oropharyngeal exudate.  Eyes: Pupils are equal, round, and reactive to light. EOM are normal.  Neck: Neck supple. No thyromegaly present.  Cardiovascular: Normal rate and regular rhythm.  Respiratory: Effort normal and breath sounds normal.  GI: Soft. Bowel sounds are normal. There is no abdominal tenderness.    Small, superficial mass to left inguinal region. Non tender.   Musculoskeletal: Normal range of motion.     Comments: Moderate swelling to right MTP joint of first toe. No erythema.   Neurological: He is alert and oriented to person, place, and time.  Skin: Skin is warm and dry.  Psychiatric: He has a normal mood and affect.           Assessment & Plan:

## 2019-04-02 NOTE — Assessment & Plan Note (Signed)
Recent A1C is out of the prediabetic range, commended him on this. Continue to work on diet and activity.

## 2019-04-02 NOTE — Assessment & Plan Note (Signed)
Chronic, history of inguinal hernia repair from 2004. Obvious mass noted to left inguinal region.  Ultrasound pending.

## 2019-04-02 NOTE — Patient Instructions (Signed)
You will be contacted regarding your ultrasound.  Please let us know if you have not been contacted within one week.   Stop by the lab prior to leaving today. I will notify you of your results once received.   Continue exercising. You should be getting 150 minutes of exercise weekly.  Continue to work on a healthy diet.   Ensure you are consuming 64 ounces of water daily.  Continue to refrain from smoking.  It was a pleasure to see you today!   Preventive Care 48-12 Years Old, Male Preventive care refers to lifestyle choices and visits with your health care provider that can promote health and wellness. This includes:  A yearly physical exam. This is also called an annual well check.  Regular dental and eye exams.  Immunizations.  Screening for certain conditions.  Healthy lifestyle choices, such as eating a healthy diet, getting regular exercise, not using drugs or products that contain nicotine and tobacco, and limiting alcohol use. What can I expect for my preventive care visit? Physical exam Your health care provider will check:  Height and weight. These may be used to calculate body mass index (BMI), which is a measurement that tells if you are at a healthy weight.  Heart rate and blood pressure.  Your skin for abnormal spots. Counseling Your health care provider may ask you questions about:  Alcohol, tobacco, and drug use.  Emotional well-being.  Home and relationship well-being.  Sexual activity.  Eating habits.  Work and work Statistician. What immunizations do I need?  Influenza (flu) vaccine  This is recommended every year. Tetanus, diphtheria, and pertussis (Tdap) vaccine  You may need a Td booster every 10 years. Varicella (chickenpox) vaccine  You may need this vaccine if you have not already been vaccinated. Zoster (shingles) vaccine  You may need this after age 67. Measles, mumps, and rubella (MMR) vaccine  You may need at least one dose  of MMR if you were born in 1957 or later. You may also need a second dose. Pneumococcal conjugate (PCV13) vaccine  You may need this if you have certain conditions and were not previously vaccinated. Pneumococcal polysaccharide (PPSV23) vaccine  You may need one or two doses if you smoke cigarettes or if you have certain conditions. Meningococcal conjugate (MenACWY) vaccine  You may need this if you have certain conditions. Hepatitis A vaccine  You may need this if you have certain conditions or if you travel or work in places where you may be exposed to hepatitis A. Hepatitis B vaccine  You may need this if you have certain conditions or if you travel or work in places where you may be exposed to hepatitis B. Haemophilus influenzae type b (Hib) vaccine  You may need this if you have certain risk factors. Human papillomavirus (HPV) vaccine  If recommended by your health care provider, you may need three doses over 6 months. You may receive vaccines as individual doses or as more than one vaccine together in one shot (combination vaccines). Talk with your health care provider about the risks and benefits of combination vaccines. What tests do I need? Blood tests  Lipid and cholesterol levels. These may be checked every 5 years, or more frequently if you are over 19 years old.  Hepatitis C test.  Hepatitis B test. Screening  Lung cancer screening. You may have this screening every year starting at age 67 if you have a 30-pack-year history of smoking and currently smoke or have quit  within the past 15 years.  Prostate cancer screening. Recommendations will vary depending on your family history and other risks.  Colorectal cancer screening. All adults should have this screening starting at age 35 and continuing until age 20. Your health care provider may recommend screening at age 9 if you are at increased risk. You will have tests every 1-10 years, depending on your results and the  type of screening test.  Diabetes screening. This is done by checking your blood sugar (glucose) after you have not eaten for a while (fasting). You may have this done every 1-3 years.  Sexually transmitted disease (STD) testing. Follow these instructions at home: Eating and drinking  Eat a diet that includes fresh fruits and vegetables, whole grains, lean protein, and low-fat dairy products.  Take vitamin and mineral supplements as recommended by your health care provider.  Do not drink alcohol if your health care provider tells you not to drink.  If you drink alcohol: ? Limit how much you have to 0-2 drinks a day. ? Be aware of how much alcohol is in your drink. In the [place] one drink equals one 12 oz bottle of beer (355 mL), one 5 oz glass of wine (148 mL), or one 1 oz glass of hard liquor (44 mL). Lifestyle  Take daily care of your teeth and gums.  Stay active. Exercise for at least 30 minutes on 5 or more days each week.  Do not use any products that contain nicotine or tobacco, such as cigarettes, e-cigarettes, and chewing tobacco. If you need help quitting, ask your health care provider.  If you are sexually active, practice safe sex. Use a condom or other form of protection to prevent STIs (sexually transmitted infections).  Talk with your health care provider about taking a low-dose aspirin every day starting at age 30. What's next?  Go to your health care provider once a year for a well check visit.  Ask your health care provider how often you should have your eyes and teeth checked.  Stay up to date on all vaccines. This information is not intended to replace advice given to you by your health care provider. Make sure you discuss any questions you have with your health care provider. Document Released: 10/17/2015 Document Revised: 09/14/2018 Document Reviewed: 09/14/2018 Elsevier Patient Education  2020 [employer].

## 2019-04-02 NOTE — Assessment & Plan Note (Signed)
Chronic, overall about the same. Continue to stay active, stretching.

## 2019-04-02 NOTE — Assessment & Plan Note (Signed)
LDL at goal on statin therapy, continue same. 

## 2019-04-02 NOTE — Assessment & Plan Note (Signed)
Degenerative disease noted from xray in 2019. Check uric acid level today. Consider podiatry evaluation.

## 2019-04-02 NOTE — Assessment & Plan Note (Signed)
LDL at goal, continue statin therapy. Commended him on tobacco cessation.  Continue to monitor.

## 2019-04-02 NOTE — Assessment & Plan Note (Signed)
Stable on Wellbutrin SR for which he mostly takes for tobacco cessation. Denies concerns today.

## 2019-04-03 LAB — URIC ACID: Uric Acid, Serum: 6.1 mg/dL (ref 4.0–7.8)

## 2019-04-04 ENCOUNTER — Telehealth: Payer: Self-pay | Admitting: *Deleted

## 2019-04-04 ENCOUNTER — Other Ambulatory Visit: Payer: Self-pay | Admitting: Primary Care

## 2019-04-04 DIAGNOSIS — Z87891 Personal history of nicotine dependence: Secondary | ICD-10-CM

## 2019-04-04 DIAGNOSIS — Z122 Encounter for screening for malignant neoplasm of respiratory organs: Secondary | ICD-10-CM

## 2019-04-04 MED ORDER — TRAZODONE HCL 100 MG PO TABS: ORAL_TABLET | ORAL | 2 refills | Status: DC

## 2019-04-04 NOTE — Telephone Encounter (Signed)
Patient has been notified that annual lung cancer screening low dose CT scan is due currently or will be in near future. Confirmed that patient is within the age range of 55-77, and asymptomatic, (no signs or symptoms of lung cancer). Patient denies illness that would prevent curative treatment for lung cancer if found. Verified smoking history, (former, quit 12/03/18, 37 pack year). The shared decision making visit was done 01/31/15. Patient is agreeable for CT scan being scheduled.

## 2019-04-04 NOTE — Addendum Note (Signed)
Addended by: Jacqualin Combes on: 04/04/2019 05:09 PM   Modules accepted: Orders

## 2019-04-04 NOTE — Addendum Note (Signed)
Addended by: Jacqualin Combes on: 04/04/2019 05:10 PM   Modules accepted: Orders

## 2019-04-09 ENCOUNTER — Ambulatory Visit
Admission: RE | Admit: 2019-04-09 | Discharge: 2019-04-09 | Disposition: A | Discharge status: Home / Self Care | Payer: Medicare Other | Source: Ambulatory Visit | Attending: Oncology | Admitting: Oncology

## 2019-04-09 ENCOUNTER — Other Ambulatory Visit: Payer: Self-pay

## 2019-04-09 DIAGNOSIS — Z87891 Personal history of nicotine dependence: Secondary | ICD-10-CM | POA: Diagnosis not present

## 2019-04-09 DIAGNOSIS — Z122 Encounter for screening for malignant neoplasm of respiratory organs: Secondary | ICD-10-CM | POA: Diagnosis not present

## 2019-04-11 ENCOUNTER — Encounter: Payer: Self-pay | Admitting: *Deleted

## 2019-04-19 ENCOUNTER — Ambulatory Visit
Admission: RE | Admit: 2019-04-19 | Discharge: 2019-04-19 | Disposition: A | Discharge status: Home / Self Care | Payer: Medicare Other | Source: Ambulatory Visit | Attending: Primary Care | Admitting: Primary Care

## 2019-04-19 DIAGNOSIS — R1032 Left lower quadrant pain: Secondary | ICD-10-CM

## 2019-04-19 DIAGNOSIS — R1030 Lower abdominal pain, unspecified: Secondary | ICD-10-CM | POA: Diagnosis not present

## 2019-04-20 ENCOUNTER — Encounter: Payer: Self-pay | Admitting: Primary Care

## 2019-04-24 ENCOUNTER — Telehealth: Payer: Self-pay | Admitting: Primary Care

## 2019-04-24 ENCOUNTER — Other Ambulatory Visit: Payer: Self-pay | Admitting: Internal Medicine

## 2019-04-24 DIAGNOSIS — R220 Localized swelling, mass and lump, head: Secondary | ICD-10-CM

## 2019-04-24 DIAGNOSIS — R1909 Other intra-abdominal and pelvic swelling, mass and lump: Secondary | ICD-10-CM

## 2019-04-24 NOTE — Telephone Encounter (Signed)
CT ordered. 

## 2019-04-24 NOTE — Telephone Encounter (Signed)
Noted  

## 2019-04-24 NOTE — Telephone Encounter (Signed)
Patient said Vallarie Mare called patient about a CT Scan.  Patient said he called his insurance and they said they'd pay 80% as long as it was in network.  Patient said it's okay to go ahead and schedule CT.  Patient said he can go anytime.  If he has to fast for test, he'd prefer morning.

## 2019-04-30 ENCOUNTER — Other Ambulatory Visit: Payer: Self-pay | Admitting: Primary Care

## 2019-04-30 DIAGNOSIS — F17200 Nicotine dependence, unspecified, uncomplicated: Secondary | ICD-10-CM

## 2019-05-07 ENCOUNTER — Ambulatory Visit (INDEPENDENT_AMBULATORY_CARE_PROVIDER_SITE_OTHER)
Admission: RE | Admit: 2019-05-07 | Discharge: 2019-05-07 | Disposition: A | Discharge status: Home / Self Care | Payer: Medicare Other | Source: Ambulatory Visit | Attending: Internal Medicine | Admitting: Internal Medicine

## 2019-05-07 ENCOUNTER — Encounter (INDEPENDENT_AMBULATORY_CARE_PROVIDER_SITE_OTHER): Payer: Self-pay

## 2019-05-07 ENCOUNTER — Other Ambulatory Visit: Payer: Self-pay

## 2019-05-07 DIAGNOSIS — R220 Localized swelling, mass and lump, head: Secondary | ICD-10-CM

## 2019-05-07 DIAGNOSIS — R1909 Other intra-abdominal and pelvic swelling, mass and lump: Secondary | ICD-10-CM

## 2019-05-07 DIAGNOSIS — K760 Fatty (change of) liver, not elsewhere classified: Secondary | ICD-10-CM | POA: Diagnosis not present

## 2019-05-07 MED ORDER — IOHEXOL 300 MG/ML  SOLN
100.0000 mL | Freq: Once | INTRAMUSCULAR | Status: AC | PRN
  Administered 2019-05-07: 100 mL via INTRAVENOUS

## 2019-05-09 ENCOUNTER — Telehealth: Payer: Self-pay | Admitting: Primary Care

## 2019-05-09 NOTE — Telephone Encounter (Signed)
Patient returned your call  C/B # [phone]

## 2019-05-09 NOTE — Telephone Encounter (Signed)
Spoken and notified patient of Kate Clark's comments. Patient verbalized understanding.  

## 2019-05-09 NOTE — Telephone Encounter (Signed)
-----   Message from Wellington Hampshire, MD sent at 05/09/2019  1:53 PM EDT ----- Regarding: RE: Aortic Atherosclerosis Hi Belenda Cruise, I think he was seen for this by Dr. Candis Musa last year.  Overall, there is not much benefit for Plavix unless he had a previous stroke, PAD or stenting.  I do recommend low-dose aspirin in this situation given that he has established atherosclerosis. LDL target below 70 is an option of reasonable goal.  Definitely less than 100.   ----- Message ----- From: Pleas Koch, NP Sent: 05/08/2019   7:54 PM EDT To: Wellington Hampshire, MD Subject: Aortic Atherosclerosis                         Hi Dr. Fletcher Anon,  This patient had an incidental finding of aortic atherosclerosis on recent CT scan.  He has a significant history of tobacco abuse (quit 5 months ago), hyperlipidemia (managed on statin with recent LDL of 74), and prediabetes (controlled).  How aggressive do you get with this? Would clopidogrel be a good option? Should we increase his statin dose for LDL goal of less than <70.  I appreciate your thoughts! Thanks! Allie Bossier, NP-[facility]

## 2019-05-09 NOTE — Telephone Encounter (Signed)
Please notify patient that he should be taking 81 mg of aspirin daily for the plaque build up on the aorta. Continue atorvastatin.

## 2019-05-14 NOTE — Telephone Encounter (Signed)
Spoken and notified patient of Don Christian comments. Patient verbalized understanding. Also patient stated that he will start taking the asprin. However, patient stated that that what can we do about the pain. Any suggestions what to do next, the pain is not going away and not much better.

## 2019-05-14 NOTE — Telephone Encounter (Signed)
Looks like Don Christian was going to follow up on this upon her return.

## 2019-05-17 NOTE — Telephone Encounter (Signed)
Given that his ultrasound and CT scan did not reveal any abdominal or hernia cause, his symptoms could be from arthritis to the hip.  Is he willing to come back so that I can examine his hips and perhaps do an x-ray?

## 2019-05-18 NOTE — Telephone Encounter (Signed)
Patient stated that are bumps in the area of the pain. He does not think and feel it at the hip area. Patient seem discourage and stated that he will just deal with it then.

## 2019-05-18 NOTE — Telephone Encounter (Signed)
Noted.  There were no evidence of enlarged lymph nodes on CT scan. I am happy to see him in the office anytime for arthritis evaluation.

## 2019-07-04 ENCOUNTER — Other Ambulatory Visit: Payer: Self-pay | Admitting: Primary Care

## 2019-07-04 DIAGNOSIS — I700 Atherosclerosis of aorta: Secondary | ICD-10-CM

## 2019-10-05 HISTORY — PX: FOOT SURGERY: SHX648

## 2019-11-06 ENCOUNTER — Other Ambulatory Visit: Payer: Self-pay | Admitting: Primary Care

## 2019-11-06 DIAGNOSIS — F17200 Nicotine dependence, unspecified, uncomplicated: Secondary | ICD-10-CM

## 2020-01-01 ENCOUNTER — Other Ambulatory Visit: Payer: Self-pay | Admitting: Primary Care

## 2020-01-11 ENCOUNTER — Ambulatory Visit (INDEPENDENT_AMBULATORY_CARE_PROVIDER_SITE_OTHER): Payer: Medicare Other | Admitting: Primary Care

## 2020-01-11 ENCOUNTER — Other Ambulatory Visit: Payer: Self-pay

## 2020-01-11 ENCOUNTER — Encounter: Payer: Self-pay | Admitting: Primary Care

## 2020-01-11 VITALS — BP 130/82 | HR 66 | Temp 96.9°F | Ht 72.0 in | Wt 141.2 lb

## 2020-01-11 DIAGNOSIS — M79671 Pain in right foot: Secondary | ICD-10-CM

## 2020-01-11 DIAGNOSIS — G8929 Other chronic pain: Secondary | ICD-10-CM

## 2020-01-11 DIAGNOSIS — J439 Emphysema, unspecified: Secondary | ICD-10-CM

## 2020-01-11 DIAGNOSIS — N529 Male erectile dysfunction, unspecified: Secondary | ICD-10-CM | POA: Diagnosis not present

## 2020-01-11 MED ORDER — TADALAFIL 5 MG PO TABS: ORAL_TABLET | ORAL | 0 refills | Status: DC

## 2020-01-11 MED ORDER — ALBUTEROL SULFATE HFA 108 (90 BASE) MCG/ACT IN AERS: 2.0000 | INHALATION_SPRAY | Freq: Four times a day (QID) | RESPIRATORY_TRACT | 0 refills | Status: DC | PRN

## 2020-01-11 NOTE — Assessment & Plan Note (Signed)
Refill provided for albuterol for which he uses during sparingly during Spring and Summer months. No wheezing on exam.

## 2020-01-11 NOTE — Assessment & Plan Note (Signed)
Moderate arthritis to right first MTP joint. No erythema or evidence of gout today. Referral placed to podiatry for treatment.

## 2020-01-11 NOTE — Progress Notes (Signed)
Subjective:    Patient ID: Don Christian, male    DOB: [birth year], 64 y.o.   MRN: [id number]  HPI  This visit occurred during the SARS-CoV-2 public health emergency.  Safety protocols were in place, including screening questions prior to the visit, additional usage of staff PPE, and extensive cleaning of exam room while observing appropriate contact time as indicated for disinfecting solutions.   [name] is a 64 year old male with a history of tobacco abuse, prediabetes, hyperlipidemia, erectile dysfunction who presents today for medication refills. He would also like a referral to Podiatry.  He would like a refill of his tadalafil 5 mg. He was prescribed this by Urology over one year ago, was taking as needed. This was very effective.   No recent use of albuterol inhaler, mostly uses during Spring and Summer months with allergy season.   Chronic right first MTP point pain for years, intermittently mostly. About month ago he was in [place], doing a lot of walking at the time. Since then he's noticed constant pain, worse than usual. He denies increased swelling, redness. He's taking Ibuprofen with some improvement. Last evaluated for this in 2019, xray confirmed arthritis to MTP joint, negative for acute gout.  BP Readings from Last 3 Encounters:  01/11/20 130/82  04/02/19 130/76  06/20/18 118/62     Review of Systems  Respiratory: Negative for shortness of breath and wheezing.   Genitourinary:       Erectile dysfunction   Musculoskeletal: Positive for arthralgias and joint swelling.  Skin: Negative for color change.       Past Medical History:  Diagnosis Date  . BPH (benign prostatic hyperplasia) 09/16/2014  . Cataract    left eye  . Chronic back pain 09/16/2014  . Colon polyps   . COPD (chronic obstructive pulmonary disease) (HCC)    mild  . DDD (degenerative disc disease), lumbar 09/16/2014  . Full dentures   . Inguinal hernia 2004  . Insomnia 09/16/2014  .  Personal history of tobacco use, presenting hazards to health 03/10/2016  . Prostate disorder   . Sleeping difficulties   . Tobacco use disorder 09/16/2014     Social History   Socioeconomic History  . Marital status: Married    Spouse name: Not on file  . Number of children: Not on file  . Years of education: Not on file  . Highest education level: Not on file  Occupational History  . Not on file  Tobacco Use  . Smoking status: Former Smoker    Years: 20.00    Types: Cigars    Quit date: 11/04/2018    Years since quitting: 1.1  . Smokeless tobacco: Never Used  Substance and Sexual Activity  . Alcohol use: Yes    Alcohol/week: 0.0 standard drinks    Comment: occassionally on social occassions  . Drug use: No  . Sexual activity: Yes  Other Topics Concern  . Not on file  Social History Narrative   ** Merged History Encounter **       Work or School: no work currently, disabled from back injury in 2012 Home Situation: lives with wife Spiritual Beliefs: none Lifestyle: walks dog 2x per day;  Healthy diet per his report. Enjoys spending time outdoors.      Social Determinants of Health   Financial Resource Strain:   . Difficulty of Paying Living Expenses:   Food Insecurity:   . Worried About Charity fundraiser in the Last Year:   .  Ran Out of Food in the Last Year:   Transportation Needs:   . Film/video editor (Medical):   Marland Kitchen Lack of Transportation (Non-Medical):   Physical Activity:   . Days of Exercise per Week:   . Minutes of Exercise per Session:   Stress:   . Feeling of Stress :   Social Connections:   . Frequency of Communication with Friends and Family:   . Frequency of Social Gatherings with Friends and Family:   . Attends Religious Services:   . Active Member of Clubs or Organizations:   . Attends Archivist Meetings:   Marland Kitchen Marital Status:   Intimate Partner Violence:   . Fear of Current or Ex-Partner:   . Emotionally Abused:   Marland Kitchen  Physically Abused:   . Sexually Abused:     Past Surgical History:  Procedure Laterality Date  . ABDOMINAL EXPOSURE N/A 05/09/2015   Procedure: ABDOMINAL EXPOSURE;  Surgeon: Rosetta Posner, MD;  Location: [facility];  Service: Vascular;  Laterality: N/A;  . ANTERIOR LUMBAR FUSION N/A 05/09/2015   Procedure: LUMBAR FOUR-FIVE ANTERIOR LUMBAR FUSION WITH ABDOMINAL EXPOSURE;  Surgeon: Eustace Moore, MD;  Location: [facility];  Service: Neurosurgery;  Laterality: N/A;  . COLONOSCOPY W/ BIOPSIES AND POLYPECTOMY    . COLONOSCOPY WITH PROPOFOL N/A 03/18/2017   Procedure: COLONOSCOPY WITH PROPOFOL;  Surgeon: [name], MD;  Location: [facility];  Service: Endoscopy;  Laterality: N/A;  . GANGLION CYST EXCISION     left leg  . HERNIA REPAIR Left    inguinal at 14   . TOOTH EXTRACTION      Family History  Problem Relation Age of Onset  . Cancer Mother        Lung  . Heart disease Mother   . Cancer Father        Lung with mets to brain  . Cancer Sister        Lung    No Known Allergies  Current Outpatient Medications on File Prior to Visit  Medication Sig Dispense Refill  . atorvastatin (LIPITOR) 10 MG tablet TAKE ONE TABLET BY MOUTH EVERY EVENING 90 tablet 1  . buPROPion (ZYBAN) 150 MG 12 hr tablet TAKE ONE TABLET BY MOUTH TWICE A DAY FOR SMOKING CESSATION 180 tablet 0  . traZODone (DESYREL) 100 MG tablet TAKE ONE TABLET BY MOUTH EVERY NIGHT AT BEDTIME AS NEEDED FOR SLEEP 90 tablet 1   No current facility-administered medications on file prior to visit.    BP 130/82   Pulse 66   Temp (!) 96.9 F (36.1 C) (Temporal)   Ht 6' (1.829 m)   Wt 141 lb 4 oz (64.1 kg)   SpO2 98%   BMI 19.16 kg/m    Objective:   Physical Exam  Constitutional: He appears well-nourished.  Cardiovascular: Normal rate and regular rhythm.  Respiratory: Effort normal and breath sounds normal.  Musculoskeletal:     Cervical back: Neck supple.     Right foot: Decreased range of motion. Swelling  present. No tenderness or bony tenderness.       Feet:     Comments: No erythema or skin breakdown  Skin: Skin is warm and dry.           Assessment & Plan:

## 2020-01-11 NOTE — Patient Instructions (Signed)
You will be contacted regarding your referral to Podiatry.  Please let us know if you have not been contacted within two weeks.   Use the albuterol if needed.  It was a pleasure to see you today!

## 2020-01-11 NOTE — Assessment & Plan Note (Signed)
Refill provided for tadalafil 5 mg.  Discussed to use as needed.

## 2020-01-15 ENCOUNTER — Ambulatory Visit (INDEPENDENT_AMBULATORY_CARE_PROVIDER_SITE_OTHER): Payer: Medicare Other | Admitting: Podiatry

## 2020-01-15 ENCOUNTER — Other Ambulatory Visit: Payer: Self-pay

## 2020-01-15 ENCOUNTER — Ambulatory Visit (INDEPENDENT_AMBULATORY_CARE_PROVIDER_SITE_OTHER): Payer: Medicare Other

## 2020-01-15 DIAGNOSIS — M778 Other enthesopathies, not elsewhere classified: Secondary | ICD-10-CM | POA: Diagnosis not present

## 2020-01-15 DIAGNOSIS — M7751 Other enthesopathy of right foot: Secondary | ICD-10-CM

## 2020-01-15 DIAGNOSIS — M79672 Pain in left foot: Secondary | ICD-10-CM | POA: Diagnosis not present

## 2020-01-15 DIAGNOSIS — M79671 Pain in right foot: Secondary | ICD-10-CM

## 2020-01-15 DIAGNOSIS — M205X1 Other deformities of toe(s) (acquired), right foot: Secondary | ICD-10-CM | POA: Diagnosis not present

## 2020-01-17 ENCOUNTER — Other Ambulatory Visit: Payer: Self-pay | Admitting: Podiatry

## 2020-01-17 DIAGNOSIS — M778 Other enthesopathies, not elsewhere classified: Secondary | ICD-10-CM

## 2020-01-17 DIAGNOSIS — M205X1 Other deformities of toe(s) (acquired), right foot: Secondary | ICD-10-CM

## 2020-01-17 NOTE — Patient Instructions (Signed)
Pre-Operative Instructions  Congratulations, you have decided to take an important step towards improving your quality of life.  You can be assured that the doctors and staff at [facility] will be with you every step of the way.  Here are some important things you should know:  1. Plan to be at the surgery center/hospital at least 1 (one) hour prior to your scheduled time, unless otherwise directed by the surgical center/hospital staff.  You must have a responsible adult accompany you, remain during the surgery and drive you home.  Make sure you have directions to the surgical center/hospital to ensure you arrive on time. 2. If you are having surgery at [facility] or [facility], you will need a copy of your medical history and physical form from your family physician within one month prior to the date of surgery. We will give you a form for your primary physician to complete.  3. We make every effort to accommodate the date you request for surgery.  However, there are times where surgery dates or times have to be moved.  We will contact you as soon as possible if a change in schedule is required.   4. No aspirin/ibuprofen for one week before surgery.  If you are on aspirin, any non-steroidal anti-inflammatory medications (Mobic, Aleve, Ibuprofen) should not be taken seven (7) days prior to your surgery.  You make take Tylenol for pain prior to surgery.  5. Medications - If you are taking daily heart and blood pressure medications, seizure, reflux, allergy, asthma, anxiety, pain or diabetes medications, make sure you notify the surgery center/hospital before the day of surgery so they can tell you which medications you should take or avoid the day of surgery. 6. No food or drink after midnight the night before surgery unless directed otherwise by surgical center/hospital staff. 7. No alcoholic beverages 24-hours prior to surgery.  No smoking 24-hours prior or 24-hours after  surgery. 8. Wear loose pants or shorts. They should be loose enough to fit over bandages, boots, and casts. 9. Don't wear slip-on shoes. Sneakers are preferred. 10. Bring your boot with you to the surgery center/hospital.  Also bring crutches or a walker if your physician has prescribed it for you.  If you do not have this equipment, it will be provided for you after surgery. 11. If you have not been contacted by the surgery center/hospital by the day before your surgery, call to confirm the date and time of your surgery. 12. Leave-time from work may vary depending on the type of surgery you have.  Appropriate arrangements should be made prior to surgery with your employer. 13. Prescriptions will be provided immediately following surgery by your doctor.  Fill these as soon as possible after surgery and take the medication as directed. Pain medications will not be refilled on weekends and must be approved by the doctor. 14. Remove nail polish on the operative foot and avoid getting pedicures prior to surgery. 15. Wash the night before surgery.  The night before surgery wash the foot and leg well with water and the antibacterial soap provided. Be sure to pay special attention to beneath the toenails and in between the toes.  Wash for at least three (3) minutes. Rinse thoroughly with water and dry well with a towel.  Perform this wash unless told not to do so by your physician.  Enclosed: 1 Ice pack (please put in freezer the night before surgery)   1 Hibiclens skin cleaner     Pre-op instructions  If you have any questions regarding the instructions, please do not hesitate to call our office.  [place]: [address], [place], [place] [address] -- [phone]  [place]: [address] [place], [place] [address] -- [phone]  [place]: [address], ,  [address] -- [phone]   Website: [email or web address] 

## 2020-01-20 NOTE — Progress Notes (Signed)
   HPI: 64 y.o. male presenting today as a new patient with a chief complaint of shooting pain to the right hallux that began 3-4 years ago. He reports associated swelling. He states he dislocated the toe while playing basketball back in high school. Walking increases the pain. He has been taking Aspirin 600 mg as needed for treatment. Patient is here for further evaluation and treatment.   Past Medical History:  Diagnosis Date  . BPH (benign prostatic hyperplasia) 09/16/2014  . Cataract    left eye  . Chronic back pain 09/16/2014  . Colon polyps   . COPD (chronic obstructive pulmonary disease) (HCC)    mild  . DDD (degenerative disc disease), lumbar 09/16/2014  . Full dentures   . Inguinal hernia 2004  . Insomnia 09/16/2014  . Personal history of tobacco use, presenting hazards to health 03/10/2016  . Prostate disorder   . Sleeping difficulties   . Tobacco use disorder 09/16/2014     Physical Exam: General: The patient is alert and oriented x3 in no acute distress.  Dermatology: Skin is warm, dry and supple bilateral lower extremities. Negative for open lesions or macerations.  Vascular: Palpable pedal pulses bilaterally. No edema or erythema noted. Capillary refill within normal limits.  Neurological: Epicritic and protective threshold grossly intact bilaterally.   Musculoskeletal Exam: Pain on palpation with limited range of motion noted to the first MPJ right foot.  Radiographic Exam: Degenerative changes noted with joint space narrowing first MPJ. There also appears to be extra-articular spurring noted about the joint.   Assessment: 1. Hallux limitus / 1st MPJ capsulitis right    Plan of Care:  1. Patient evaluated. X-Rays reviewed.  2. Injection of 0.5 mLs Celestone Soluspan injected into the 1st MPJ of the right foot.  3. Patient wants to discuss surgery with his wife.  4. Patient has Meloxicam at home. Takes 15 mg daily.  5. Return to clinic as needed or one week  post op if patient returns to fill out surgical consent forms.      Edrick Kins, DPM [facility]  Dr. Edrick Kins, DPM    [address], [place] [address]                Office [phone]  Fax [phone]

## 2020-02-07 ENCOUNTER — Other Ambulatory Visit: Payer: Self-pay | Admitting: Podiatry

## 2020-02-07 DIAGNOSIS — M205X1 Other deformities of toe(s) (acquired), right foot: Secondary | ICD-10-CM

## 2020-02-07 DIAGNOSIS — E7800 Pure hypercholesterolemia, unspecified: Secondary | ICD-10-CM | POA: Diagnosis not present

## 2020-02-07 DIAGNOSIS — M25571 Pain in right ankle and joints of right foot: Secondary | ICD-10-CM | POA: Diagnosis not present

## 2020-02-07 MED ORDER — OXYCODONE-ACETAMINOPHEN 5-325 MG PO TABS: 1.0000 | ORAL_TABLET | Freq: Four times a day (QID) | ORAL | 0 refills | Status: DC | PRN

## 2020-02-07 NOTE — Progress Notes (Signed)
PRN postop 

## 2020-02-08 ENCOUNTER — Telehealth: Payer: Self-pay | Admitting: *Deleted

## 2020-02-08 ENCOUNTER — Ambulatory Visit: Payer: Medicare Other | Admitting: Podiatry

## 2020-02-08 NOTE — Telephone Encounter (Signed)
"  I just had surgery.   They were supposed to give me a prescription I guess but I didn't get it.  When you get a chance, give me a call back."  "This is Don Christian.  My husband, Aristotle Stellwagen had surgery yesterday.  He had a block and the block has worn off.  He's in severe pain.  They did not prescribe him anything.  If you could, please give me a call back."  I am returning your call.  We received a call from you as well as your wife about the pain medication.  "I spoke to someone earlier this morning. No one told me that the medication was called into [facility].  My wife picked it up this morning."  I wanted to make sure you received it.  "I have another question while I have you.  How long do I have to wear this boot?"  You need to wear that boot until you come in for your scheduled appointment.  "I can't take it off at all?"  You can take it off while you are sitting and relaxing.  Whenever you are up and about you need to have it on.  We recommend you sleep in it as well because you may need to get up and go to the restroom.  "I'll definitely sleep in because I don't want my foot to touch the bed.  Thanks for calling me back."

## 2020-02-15 ENCOUNTER — Other Ambulatory Visit: Payer: Self-pay

## 2020-02-15 ENCOUNTER — Ambulatory Visit (INDEPENDENT_AMBULATORY_CARE_PROVIDER_SITE_OTHER): Payer: Medicare Other

## 2020-02-15 ENCOUNTER — Encounter: Payer: Self-pay | Admitting: Podiatry

## 2020-02-15 ENCOUNTER — Ambulatory Visit (INDEPENDENT_AMBULATORY_CARE_PROVIDER_SITE_OTHER): Payer: Medicare Other | Admitting: Podiatry

## 2020-02-15 VITALS — BP 154/82 | HR 54 | Temp 96.3°F

## 2020-02-15 DIAGNOSIS — M205X1 Other deformities of toe(s) (acquired), right foot: Secondary | ICD-10-CM

## 2020-02-15 DIAGNOSIS — Z9889 Other specified postprocedural states: Secondary | ICD-10-CM

## 2020-02-17 NOTE — Progress Notes (Signed)
   Subjective:  Patient presents today status post 1st MPJ implant right. DOS: 02/07/2020. He states he is doing well. He reports mild pain that is controlled by taking Ibuprofen. There are no worsening factors noted. He has been using the CAM boot as directed. Patient is here for further evaluation and treatment.    Past Medical History:  Diagnosis Date  . BPH (benign prostatic hyperplasia) 09/16/2014  . Cataract    left eye  . Chronic back pain 09/16/2014  . Colon polyps   . COPD (chronic obstructive pulmonary disease) (HCC)    mild  . DDD (degenerative disc disease), lumbar 09/16/2014  . Full dentures   . Inguinal hernia 2004  . Insomnia 09/16/2014  . Personal history of tobacco use, presenting hazards to health 03/10/2016  . Prostate disorder   . Sleeping difficulties   . Tobacco use disorder 09/16/2014      Objective/Physical Exam Neurovascular status intact.  Skin incisions appear to be well coapted with sutures and staples intact. No sign of infectious process noted. No dehiscence. No active bleeding noted. Moderate edema noted to the surgical extremity.  Radiographic Exam:  Orthopedic hardware and osteotomies sites appear to be stable with routine healing.  Assessment: 1. s/p 1st MPJ implant right. DOS: 02/07/2020   Plan of Care:  1. Patient was evaluated. X-rays reviewed 2. Dressing changed.  3. Continue using CAM boot.  4. Ace wraps provided.  5. Return to clinic in one week for staple remover.    Edrick Kins, DPM [facility]  Dr. Edrick Kins, [place]                                        [place], [place] [address]                Office [phone]  Fax [phone]

## 2020-02-22 ENCOUNTER — Encounter: Payer: Self-pay | Admitting: Podiatry

## 2020-02-22 ENCOUNTER — Ambulatory Visit (INDEPENDENT_AMBULATORY_CARE_PROVIDER_SITE_OTHER): Payer: Medicare Other | Admitting: Podiatry

## 2020-02-22 ENCOUNTER — Other Ambulatory Visit: Payer: Self-pay

## 2020-02-22 DIAGNOSIS — Z9889 Other specified postprocedural states: Secondary | ICD-10-CM

## 2020-02-22 DIAGNOSIS — M205X1 Other deformities of toe(s) (acquired), right foot: Secondary | ICD-10-CM

## 2020-02-25 NOTE — Progress Notes (Signed)
   Subjective:  Patient presents today status post 1st MPJ implant right. DOS: 02/07/2020. He states he is doing very well. He denies any pain or modifying factors. He has been using the CAM boot as directed. Patient is here for further evaluation and treatment.   Past Medical History:  Diagnosis Date  . BPH (benign prostatic hyperplasia) 09/16/2014  . Cataract    left eye  . Chronic back pain 09/16/2014  . Colon polyps   . COPD (chronic obstructive pulmonary disease) (HCC)    mild  . DDD (degenerative disc disease), lumbar 09/16/2014  . Full dentures   . Inguinal hernia 2004  . Insomnia 09/16/2014  . Personal history of tobacco use, presenting hazards to health 03/10/2016  . Prostate disorder   . Sleeping difficulties   . Tobacco use disorder 09/16/2014      Objective/Physical Exam Neurovascular status intact.  Skin incisions appear to be well coapted with sutures and staples intact. No sign of infectious process noted. No dehiscence. No active bleeding noted. Moderate edema noted to the surgical extremity.  Assessment: 1. s/p 1st MPJ implant right. DOS: 02/07/2020   Plan of Care:  1. Patient was evaluated.  2. Staples removed.  3. Post op shoe dispensed.  Discontinue using CAM boot.  4. Return to clinic in 2 weeks for follow up X-Rays.     Edrick Kins, DPM [facility]  Dr. Edrick Kins, [address]                                        [place], [place] [address]                Office [phone]  Fax [phone]

## 2020-03-05 ENCOUNTER — Other Ambulatory Visit: Payer: Self-pay | Admitting: Primary Care

## 2020-03-05 DIAGNOSIS — I700 Atherosclerosis of aorta: Secondary | ICD-10-CM

## 2020-03-07 ENCOUNTER — Other Ambulatory Visit: Payer: Self-pay

## 2020-03-07 ENCOUNTER — Ambulatory Visit (INDEPENDENT_AMBULATORY_CARE_PROVIDER_SITE_OTHER): Payer: Medicare Other | Admitting: Podiatry

## 2020-03-07 ENCOUNTER — Encounter: Payer: Self-pay | Admitting: Podiatry

## 2020-03-07 ENCOUNTER — Ambulatory Visit (INDEPENDENT_AMBULATORY_CARE_PROVIDER_SITE_OTHER): Payer: Medicare Other

## 2020-03-07 DIAGNOSIS — M205X1 Other deformities of toe(s) (acquired), right foot: Secondary | ICD-10-CM | POA: Diagnosis not present

## 2020-03-07 DIAGNOSIS — Z9889 Other specified postprocedural states: Secondary | ICD-10-CM

## 2020-03-09 NOTE — Progress Notes (Signed)
   Subjective:  Patient presents today status post 1st MPJ implant right. DOS: 02/07/2020.  Patient states he is doing very well denies pain.  He is slowly increasing his activity and overall doing very well.   Past Medical History:  Diagnosis Date  . BPH (benign prostatic hyperplasia) 09/16/2014  . Cataract    left eye  . Chronic back pain 09/16/2014  . Colon polyps   . COPD (chronic obstructive pulmonary disease) (HCC)    mild  . DDD (degenerative disc disease), lumbar 09/16/2014  . Full dentures   . Inguinal hernia 2004  . Insomnia 09/16/2014  . Personal history of tobacco use, presenting hazards to health 03/10/2016  . Prostate disorder   . Sleeping difficulties   . Tobacco use disorder 09/16/2014      Objective/Physical Exam Neurovascular status intact.  Good range of motion of the first MTPJ.  Minimal swelling noted.  Skin incisions completely healed.  Radiographic exam: Orthopedic hardware in place implant in place.  No change since prior x-rays.  Good alignment of the first ray  Assessment: 1. s/p 1st MPJ implant right. DOS: 02/07/2020   Plan of Care:  1. Patient was evaluated.  2.  Recommend good supportive shoes 3.  Discontinue postoperative shoe. 4.  Patient may resume full activity no restrictions 5.  Return to clinic as needed  Edrick Kins, DPM [facility]  Dr. Edrick Kins, [address]                                        [place], [place] [address]                Office [phone]  Fax [phone]

## 2020-03-11 ENCOUNTER — Ambulatory Visit (INDEPENDENT_AMBULATORY_CARE_PROVIDER_SITE_OTHER): Payer: Medicare Other | Admitting: Primary Care

## 2020-03-11 ENCOUNTER — Other Ambulatory Visit: Payer: Self-pay

## 2020-03-11 ENCOUNTER — Telehealth: Payer: Self-pay

## 2020-03-11 ENCOUNTER — Encounter: Payer: Self-pay | Admitting: Primary Care

## 2020-03-11 DIAGNOSIS — W57XXXA Bitten or stung by nonvenomous insect and other nonvenomous arthropods, initial encounter: Secondary | ICD-10-CM

## 2020-03-11 DIAGNOSIS — S70362A Insect bite (nonvenomous), left thigh, initial encounter: Secondary | ICD-10-CM

## 2020-03-11 NOTE — Telephone Encounter (Signed)
Evaluated today.

## 2020-03-11 NOTE — Telephone Encounter (Signed)
[facility] - Client TELEPHONE ADVICE RECORD [facility] Patient Name: Don Christian Gender: Male DOB: 1955-10-27 Age: 64 Y 18 D Return Phone Number: [phone] (Primary) Address: City/State/Zip: [place] [address] Client [facility] - Client Client [facility] - Day Physician Alma Friendly - NP Contact Type Call Who Is Calling Patient / Member / Family / Caregiver Call Type Triage / Clinical Relationship To Patient Self Return Phone Number [phone] (Primary) Chief Complaint Tick Bite Reason for Call Symptomatic / Request for [place] states he has a tick bite and has questions. Translation No Nurse Assessment Nurse: Zorita Pang, RN, Deborah Date/Time (Eastern Time): 03/10/2020 3:16:07 PM Confirm and document reason for call. If symptomatic, describe symptoms. ---The caller states that he had a tick bite and now it has a big whelt. Some itching. Wife removed yesterday and intact. Has the patient had close contact with a person known or suspected to have the novel coronavirus illness OR traveled / lives in area with major community spread (including international travel) in the last 14 days from the onset of symptoms? * If Asymptomatic, screen for exposure and travel within the last 14 days. ---No Does the patient have any new or worsening symptoms? ---Yes Will a triage be completed? ---Yes Related visit to physician within the last 2 weeks? ---No Does the PT have any chronic conditions? (i.e. diabetes, asthma, this includes High risk factors for pregnancy, etc.) ---Yes List chronic conditions. ---COPD Is this a behavioral health or substance abuse call? ---No Guidelines Guideline Title Affirmed Question Affirmed Notes Nurse Date/Time (Eastern Time) Tick Bite Red ring or bull's-eye rash occurs at tick bite Womble, RN, Neoma Laming 03/10/2020 3:17:49 PM Disp.  Time Eilene Ghazi Time) Disposition Final User 03/10/2020 3:23:14 PM See PCP within 24 Hours Yes Womble, RN, DeborahPLEASE NOTE: All timestamps contained within this report are represented as [place] Standard Time. CONFIDENTIALTY NOTICE: This fax transmission is intended only for the addressee. It contains information that is legally privileged, confidential or otherwise protected from use or disclosure. If you are not the intended recipient, you are strictly prohibited from reviewing, disclosing, copying using or disseminating any of this information or taking any action in reliance on or regarding this information. If you have received this fax in error, please notify us immediately by telephone so that we can arrange for its return to [place]. Phone: [phone], Toll-Free: [phone], Fax: [phone] Page: 2 of 2 Call Id: [id number] [place] Disagree/Comply Comply Caller Understands Yes PreDisposition Call Doctor Care Advice Given Per Guideline SEE PCP WITHIN 24 HOURS: * Apply antibiotic ointment (OTC) to the infected area 3 times per day. * Fever occurs * You become worse. Referrals Warm transfer to backline

## 2020-03-11 NOTE — Assessment & Plan Note (Signed)
Acute, mildly irritated, which could be secondary to him using pliers yesterday. Overall no evidence of cellulitis. Picture taken an applied in his chart. He will send another picture in a few days.   He will closely monitor.

## 2020-03-11 NOTE — Telephone Encounter (Signed)
Pt has apt today.

## 2020-03-11 NOTE — Progress Notes (Signed)
Subjective:    Patient ID: Don Christian, male    DOB: 1956/01/09, 64 y.o.   MRN: [id number]  HPI  This visit occurred during the SARS-CoV-2 public health emergency.  Safety protocols were in place, including screening questions prior to the visit, additional usage of staff PPE, and extensive cleaning of exam room while observing appropriate contact time as indicated for disinfecting solutions.   Don Christian is a 64 year old male with a medical history of atherosclerosis, pulmonary emphysema, BPH, tobacco abuse, hyperlipidemia, prediabetes, erectile dysfunction who presents today with a chief complaint of tick bite.   He found a tick to the left anterior upper thigh two days ago. The tick was attached, was small, he was able to remove the entire tick.  Since then he's noticed an area of redness to the bite site and surrounding the site which is itchy. He denies warmth, pain, drainage, fevers, body aches, nausea. He took a pair of pliers and squeezed the site of the bite yesterday.    Review of Systems  Constitutional: Negative for chills and fever.  Musculoskeletal: Negative for myalgias.  Skin: Positive for color change.  Neurological: Negative for headaches.       Past Medical History:  Diagnosis Date  . BPH (benign prostatic hyperplasia) 09/16/2014  . Cataract    left eye  . Chronic back pain 09/16/2014  . Colon polyps   . COPD (chronic obstructive pulmonary disease) (HCC)    mild  . DDD (degenerative disc disease), lumbar 09/16/2014  . Full dentures   . Inguinal hernia 2004  . Insomnia 09/16/2014  . Personal history of tobacco use, presenting hazards to health 03/10/2016  . Prostate disorder   . Sleeping difficulties   . Tobacco use disorder 09/16/2014     Social History   Socioeconomic History  . Marital status: Married    Spouse name: Not on file  . Number of children: Not on file  . Years of education: Not on file  . Highest education level: Not on file    Occupational History  . Not on file  Tobacco Use  . Smoking status: Former Smoker    Years: 20.00    Types: Cigars    Quit date: 11/04/2018    Years since quitting: 1.3  . Smokeless tobacco: Never Used  Substance and Sexual Activity  . Alcohol use: Yes    Alcohol/week: 0.0 standard drinks    Comment: occassionally on social occassions  . Drug use: No  . Sexual activity: Yes  Other Topics Concern  . Not on file  Social History Narrative   ** Merged History Encounter **       Work or School: no work currently, disabled from back injury in 2012 Home Situation: lives with wife Spiritual Beliefs: none Lifestyle: walks dog 2x per day;  Healthy diet per his report. Enjoys spending time outdoors.      Social Determinants of Health   Financial Resource Strain:   . Difficulty of Paying Living Expenses:   Food Insecurity:   . Worried About Charity fundraiser in the Last Year:   . Arboriculturist in the Last Year:   Transportation Needs:   . Film/video editor (Medical):   Marland Kitchen Lack of Transportation (Non-Medical):   Physical Activity:   . Days of Exercise per Week:   . Minutes of Exercise per Session:   Stress:   . Feeling of Stress :   Social Connections:   .  Frequency of Communication with Friends and Family:   . Frequency of Social Gatherings with Friends and Family:   . Attends Religious Services:   . Active Member of Clubs or Organizations:   . Attends Archivist Meetings:   Marland Kitchen Marital Status:   Intimate Partner Violence:   . Fear of Current or Ex-Partner:   . Emotionally Abused:   Marland Kitchen Physically Abused:   . Sexually Abused:     Past Surgical History:  Procedure Laterality Date  . ABDOMINAL EXPOSURE N/A 05/09/2015   Procedure: ABDOMINAL EXPOSURE;  Surgeon: Rosetta Posner, MD;  Location: [facility];  Service: Vascular;  Laterality: N/A;  . ANTERIOR LUMBAR FUSION N/A 05/09/2015   Procedure: LUMBAR FOUR-FIVE ANTERIOR LUMBAR FUSION WITH ABDOMINAL EXPOSURE;   Surgeon: Eustace Moore, MD;  Location: [facility];  Service: Neurosurgery;  Laterality: N/A;  . COLONOSCOPY W/ BIOPSIES AND POLYPECTOMY    . COLONOSCOPY WITH PROPOFOL N/A 03/18/2017   Procedure: COLONOSCOPY WITH PROPOFOL;  Surgeon: [name], MD;  Location: [facility] ENDOSCOPY;  Service: Endoscopy;  Laterality: N/A;  . GANGLION CYST EXCISION     left leg  . HERNIA REPAIR Left    inguinal at 14   . TOOTH EXTRACTION      Family History  Problem Relation Age of Onset  . Cancer Mother        Lung  . Heart disease Mother   . Cancer Father        Lung with mets to brain  . Cancer Sister        Lung    No Known Allergies  Current Outpatient Medications on File Prior to Visit  Medication Sig Dispense Refill  . albuterol (VENTOLIN HFA) 108 (90 Base) MCG/ACT inhaler Inhale 2 puffs into the lungs every 6 (six) hours as needed for wheezing or shortness of breath. 8 g 0  . atorvastatin (LIPITOR) 10 MG tablet TAKE ONE TABLET BY MOUTH EVERY EVENING 90 tablet 0  . buPROPion (ZYBAN) 150 MG 12 hr tablet TAKE ONE TABLET BY MOUTH TWICE A DAY FOR SMOKING CESSATION 180 tablet 0  . oxyCODONE-acetaminophen (PERCOCET) 5-325 MG tablet Take 1 tablet by mouth every 6 (six) hours as needed for severe pain. 30 tablet 0  . tadalafil (CIALIS) 5 MG tablet Take 1 tablet by mouth 60 minutes prior to intercourse as needed. 30 tablet 0  . traZODone (DESYREL) 100 MG tablet TAKE ONE TABLET BY MOUTH EVERY NIGHT AT BEDTIME AS NEEDED FOR SLEEP 90 tablet 1   No current facility-administered medications on file prior to visit.    BP 120/78   Pulse 80   Temp (!) 96.4 F (35.8 C) (Temporal)   Ht 6' (1.829 m)   Wt 140 lb 8 oz (63.7 kg)   SpO2 98%   BMI 19.06 kg/m    Objective:   Physical Exam  Constitutional: He appears well-nourished.  Respiratory: Effort normal.  Skin: Skin is warm and dry. There is erythema.  Insect bite to left anterior upper thigh, site of bite slightly raised with erythema, mild erythema  surrounding site. No obvious erythematous migrans. No tenderness.             Assessment & Plan:

## 2020-03-11 NOTE — Patient Instructions (Signed)
Monitor the tick bite site as discussed, send me a picture via My Chart on Friday this week.  Notify me if you develop fevers, chills, body aches, headaches, increased redness.  It was a pleasure to see you today!

## 2020-03-21 ENCOUNTER — Other Ambulatory Visit: Payer: Self-pay | Admitting: Primary Care

## 2020-03-21 DIAGNOSIS — I700 Atherosclerosis of aorta: Secondary | ICD-10-CM

## 2020-03-21 DIAGNOSIS — R7303 Prediabetes: Secondary | ICD-10-CM

## 2020-03-21 DIAGNOSIS — E782 Mixed hyperlipidemia: Secondary | ICD-10-CM

## 2020-04-02 ENCOUNTER — Ambulatory Visit: Payer: Medicare Other

## 2020-04-02 ENCOUNTER — Telehealth: Payer: Self-pay

## 2020-04-02 NOTE — Telephone Encounter (Signed)
Called patient to complete his Medicare visit. A woman told me that the patient went to the office and knowing this was a phone visit. She said it would take the patient about 30 minutes before he gets back home. Appointment cancelled and advised her to let patient know that this can be completed on 04/04/2020 at his physical.

## 2020-04-03 ENCOUNTER — Other Ambulatory Visit (INDEPENDENT_AMBULATORY_CARE_PROVIDER_SITE_OTHER): Payer: Medicare Other

## 2020-04-03 DIAGNOSIS — I700 Atherosclerosis of aorta: Secondary | ICD-10-CM | POA: Diagnosis not present

## 2020-04-03 DIAGNOSIS — R7303 Prediabetes: Secondary | ICD-10-CM | POA: Diagnosis not present

## 2020-04-03 DIAGNOSIS — E782 Mixed hyperlipidemia: Secondary | ICD-10-CM

## 2020-04-03 LAB — COMPREHENSIVE METABOLIC PANEL
ALT: 10 U/L (ref 0–53)
AST: 14 U/L (ref 0–37)
Albumin: 4.3 g/dL (ref 3.5–5.2)
Alkaline Phosphatase: 60 U/L (ref 39–117)
BUN: 9 mg/dL (ref 6–23)
CO2: 30 mEq/L (ref 19–32)
Calcium: 9.6 mg/dL (ref 8.4–10.5)
Chloride: 101 mEq/L (ref 96–112)
Creatinine, Ser: 0.97 mg/dL (ref 0.40–1.50)
GFR: 77.65 mL/min (ref >60.00)
Glucose, Bld: 110 mg/dL — ABNORMAL HIGH (ref 70–99)
Potassium: 4.7 mEq/L (ref 3.5–5.1)
Sodium: 137 mEq/L (ref 135–145)
Total Bilirubin: 0.8 mg/dL (ref 0.2–1.2)
Total Protein: 6.5 g/dL (ref 6.0–8.3)

## 2020-04-03 LAB — CBC
HCT: 42.1 % (ref 39.0–52.0)
Hemoglobin: 14.3 g/dL (ref 13.0–17.0)
MCHC: 34 g/dL (ref 30.0–36.0)
MCV: 91 fl (ref 78.0–100.0)
Platelets: 180 10*3/uL (ref 150.0–400.0)
RBC: 4.63 Mil/uL (ref 4.22–5.81)
RDW: 13.4 % (ref 11.5–15.5)
WBC: 6 10*3/uL (ref 4.0–10.5)

## 2020-04-03 LAB — LIPID PANEL
Cholesterol: 143 mg/dL (ref 0–200)
HDL: 54.4 mg/dL (ref >39.00)
LDL Cholesterol: 76 mg/dL (ref 0–99)
NonHDL: 88.61
Total CHOL/HDL Ratio: 3
Triglycerides: 65 mg/dL (ref 0.0–149.0)
VLDL: 13 mg/dL (ref 0.0–40.0)

## 2020-04-03 LAB — HEMOGLOBIN A1C: Hgb A1c MFr Bld: 5.7 % (ref 4.6–6.5)

## 2020-04-04 ENCOUNTER — Telehealth: Payer: Self-pay

## 2020-04-04 ENCOUNTER — Other Ambulatory Visit: Payer: Self-pay

## 2020-04-04 ENCOUNTER — Encounter: Payer: Self-pay | Admitting: Primary Care

## 2020-04-04 ENCOUNTER — Ambulatory Visit (INDEPENDENT_AMBULATORY_CARE_PROVIDER_SITE_OTHER): Payer: Medicare Other | Admitting: Primary Care

## 2020-04-04 VITALS — BP 122/78 | HR 62 | Temp 95.6°F | Ht 72.0 in | Wt 140.5 lb

## 2020-04-04 DIAGNOSIS — F17200 Nicotine dependence, unspecified, uncomplicated: Secondary | ICD-10-CM

## 2020-04-04 DIAGNOSIS — N400 Enlarged prostate without lower urinary tract symptoms: Secondary | ICD-10-CM

## 2020-04-04 DIAGNOSIS — Z0000 Encounter for general adult medical examination without abnormal findings: Secondary | ICD-10-CM

## 2020-04-04 DIAGNOSIS — J439 Emphysema, unspecified: Secondary | ICD-10-CM

## 2020-04-04 DIAGNOSIS — I700 Atherosclerosis of aorta: Secondary | ICD-10-CM | POA: Diagnosis not present

## 2020-04-04 DIAGNOSIS — R1032 Left lower quadrant pain: Secondary | ICD-10-CM

## 2020-04-04 DIAGNOSIS — K5900 Constipation, unspecified: Secondary | ICD-10-CM

## 2020-04-04 DIAGNOSIS — M5136 Other intervertebral disc degeneration, lumbar region: Secondary | ICD-10-CM

## 2020-04-04 DIAGNOSIS — G4700 Insomnia, unspecified: Secondary | ICD-10-CM

## 2020-04-04 DIAGNOSIS — Z87891 Personal history of nicotine dependence: Secondary | ICD-10-CM

## 2020-04-04 DIAGNOSIS — Z23 Encounter for immunization: Secondary | ICD-10-CM | POA: Diagnosis not present

## 2020-04-04 DIAGNOSIS — Z122 Encounter for screening for malignant neoplasm of respiratory organs: Secondary | ICD-10-CM

## 2020-04-04 DIAGNOSIS — I951 Orthostatic hypotension: Secondary | ICD-10-CM | POA: Diagnosis not present

## 2020-04-04 DIAGNOSIS — F3342 Major depressive disorder, recurrent, in full remission: Secondary | ICD-10-CM

## 2020-04-04 DIAGNOSIS — E782 Mixed hyperlipidemia: Secondary | ICD-10-CM

## 2020-04-04 DIAGNOSIS — N529 Male erectile dysfunction, unspecified: Secondary | ICD-10-CM

## 2020-04-04 DIAGNOSIS — R7303 Prediabetes: Secondary | ICD-10-CM

## 2020-04-04 MED ORDER — BUPROPION HCL ER (SMOKING DET) 150 MG PO TB12: 150.0000 mg | ORAL_TABLET | Freq: Every day | ORAL | 3 refills | Status: DC

## 2020-04-04 NOTE — Assessment & Plan Note (Signed)
Overall stable, uses albuterol more frequently during Summer months. Lungs clear on exam, he continues to avoid smoking.   He will update if symptoms do not improve in cooler months.

## 2020-04-04 NOTE — Assessment & Plan Note (Signed)
Continued, CT negative. Consider vascular cause. Referral placed to vascular surgery given smoking history.

## 2020-04-04 NOTE — Assessment & Plan Note (Signed)
Stable, continue bupropion for which he is taking once daily.

## 2020-04-04 NOTE — Assessment & Plan Note (Signed)
A1C stable at 5.7, continue to monitor.

## 2020-04-04 NOTE — Assessment & Plan Note (Signed)
Doing well on Trazodone, continue same. 

## 2020-04-04 NOTE — Assessment & Plan Note (Signed)
LDL of 76 which is slightly above goal given aortic atherosclerosis and tobacco abuse history.   Will be holding atorvastatin for 2 weeks given myalgias, consider switching to rosuvastatin or pravastatin. He will update.

## 2020-04-04 NOTE — Assessment & Plan Note (Signed)
Chronic and continued, struggles with chronic back pain. Offered PT or referral and he kindly declines.

## 2020-04-04 NOTE — Patient Instructions (Signed)
Stop atorvastatin for 2 weeks due to muscle cramping, update me via my chart at that time.   You will be contacted regarding your referral to Dr. Luther Parody office.  Please let us know if you have not been contacted within two weeks.   Start exercising. You should be getting 150 minutes of moderate intensity exercise weekly.  It's important to improve your diet by reducing consumption of fast food, fried food, processed snack foods, sugary drinks. Increase consumption of fresh vegetables and fruits, whole grains, water.  Ensure you are drinking 64 ounces of water daily.  It was a pleasure to see you today!   Preventive Care 60-76 Years Old, Male Preventive care refers to lifestyle choices and visits with your health care provider that can promote health and wellness. This includes:  A yearly physical exam. This is also called an annual well check.  Regular dental and eye exams.  Immunizations.  Screening for certain conditions.  Healthy lifestyle choices, such as eating a healthy diet, getting regular exercise, not using drugs or products that contain nicotine and tobacco, and limiting alcohol use. What can I expect for my preventive care visit? Physical exam Your health care provider will check:  Height and weight. These may be used to calculate body mass index (BMI), which is a measurement that tells if you are at a healthy weight.  Heart rate and blood pressure.  Your skin for abnormal spots. Counseling Your health care provider may ask you questions about:  Alcohol, tobacco, and drug use.  Emotional well-being.  Home and relationship well-being.  Sexual activity.  Eating habits.  Work and work Statistician. What immunizations do I need?  Influenza (flu) vaccine  This is recommended every year. Tetanus, diphtheria, and pertussis (Tdap) vaccine  You may need a Td booster every 10 years. Varicella (chickenpox) vaccine  You may need this vaccine if you have not  already been vaccinated. Zoster (shingles) vaccine  You may need this after age 98. Measles, mumps, and rubella (MMR) vaccine  You may need at least one dose of MMR if you were born in 1957 or later. You may also need a second dose. Pneumococcal conjugate (PCV13) vaccine  You may need this if you have certain conditions and were not previously vaccinated. Pneumococcal polysaccharide (PPSV23) vaccine  You may need one or two doses if you smoke cigarettes or if you have certain conditions. Meningococcal conjugate (MenACWY) vaccine  You may need this if you have certain conditions. Hepatitis A vaccine  You may need this if you have certain conditions or if you travel or work in places where you may be exposed to hepatitis A. Hepatitis B vaccine  You may need this if you have certain conditions or if you travel or work in places where you may be exposed to hepatitis B. Haemophilus influenzae type b (Hib) vaccine  You may need this if you have certain risk factors. Human papillomavirus (HPV) vaccine  If recommended by your health care provider, you may need three doses over 6 months. You may receive vaccines as individual doses or as more than one vaccine together in one shot (combination vaccines). Talk with your health care provider about the risks and benefits of combination vaccines. What tests do I need? Blood tests  Lipid and cholesterol levels. These may be checked every 5 years, or more frequently if you are over 44 years old.  Hepatitis C test.  Hepatitis B test. Screening  Lung cancer screening. You may have this  this screening every year starting at age 55 if you have a 30-pack-year history of smoking and currently smoke or have quit within the past 15 years. °· Prostate cancer screening. Recommendations will vary depending on your family history and other risks. °· Colorectal cancer screening. All adults should have this screening starting at age 50 and continuing until age  75. Your health care provider may recommend screening at age 45 if you are at increased risk. You will have tests every 1-10 years, depending on your results and the type of screening test. °· Diabetes screening. This is done by checking your blood sugar (glucose) after you have not eaten for a while (fasting). You may have this done every 1-3 years. °· Sexually transmitted disease (STD) testing. °Follow these instructions at home: °Eating and drinking °· Eat a diet that includes fresh fruits and vegetables, whole grains, lean protein, and low-fat dairy products. °· Take vitamin and mineral supplements as recommended by your health care provider. °· Do not drink alcohol if your health care provider tells you not to drink. °· If you drink alcohol: °? Limit how much you have to 0-2 drinks a day. °? Be aware of how much alcohol is in your drink. In the [place] one drink equals one 12 oz bottle of beer (355 mL), one 5 oz glass of wine (148 mL), or one 1½ oz glass of hard liquor (44 mL). °Lifestyle °· Take daily care of your teeth and gums. °· Stay active. Exercise for at least 30 minutes on 5 or more days each week. °· Do not use any products that contain nicotine or tobacco, such as cigarettes, e-cigarettes, and chewing tobacco. If you need help quitting, ask your health care provider. °· If you are sexually active, practice safe sex. Use a condom or other form of protection to prevent STIs (sexually transmitted infections). °· Talk with your health care provider about taking a low-dose aspirin every day starting at age 50. °What's next? °· Go to your health care provider once a year for a well check visit. °· Ask your health care provider how often you should have your eyes and teeth checked. °· Stay up to date on all vaccines. °This information is not intended to replace advice given to you by your health care provider. Make sure you discuss any questions you have with your health care provider. °Document Revised:  09/14/2018 Document Reviewed: 09/14/2018 °Elsevier Patient Education © 2020 [employer] ° °

## 2020-04-04 NOTE — Assessment & Plan Note (Signed)
Doing well on cialis PRN. Continue same.

## 2020-04-04 NOTE — Addendum Note (Signed)
Addended by: Jacqualin Combes on: 04/04/2020 11:33 AM   Modules accepted: Orders

## 2020-04-04 NOTE — Assessment & Plan Note (Signed)
Compliant to atorvastatin, LDL of 76.  He does not exercise, overall fair diet.   Hold atorvastatin for 2 weeks given leg cramping, he will update. Consider switching to Crestor or pravastatin.

## 2020-04-04 NOTE — Assessment & Plan Note (Signed)
Chronic and continued. Encouraged increased fiber intake.

## 2020-04-04 NOTE — Assessment & Plan Note (Signed)
No longer on tamsulosin due to orthostasis.  Nocturia once nightly, overall tolerable.Marland Kitchen He will update if symptoms progress, consider Mybetriq at that time.

## 2020-04-04 NOTE — Assessment & Plan Note (Signed)
Resolved since removal of tamsulosin.

## 2020-04-04 NOTE — Progress Notes (Signed)
Subjective:    Patient ID: Don Christian, male    DOB: [birth year], 64 y.o.   MRN: [id number]  HPI  This visit occurred during the SARS-CoV-2 public health emergency.  Safety protocols were in place, including screening questions prior to the visit, additional usage of staff PPE, and extensive cleaning of exam room while observing appropriate contact time as indicated for disinfecting solutions.   Don Christian is a 64 year old male who presents today for complete physical.  He would also like to discuss chronic bilateral calf cramping that began about one year ago. Symptoms occur only at rest, applying pressure and walking help to improve. Over the last month he's noticed increased symptoms. He denies calf swelling, erythema.   Immunizations: -Influenza: Due this season  -Shingles: Completed  -Pneumonia: Due for Prevnar -Covid-19: Completed series  Diet: He endorses a fair diet. Drinks mostly water Exercise: No exercise.   Eye exam: Completed in 2020 Dental exam: Completes annually   Colonoscopy: Completed in 2018, due in 2028 PSA: 2.10 in 2019 Hep C Screen: Negative Lung Cancer Screening: Due in Summer 2021  BP Readings from Last 3 Encounters:  04/04/20 122/78  03/11/20 120/78  02/15/20 (!) 154/82     Review of Systems  Constitutional: Negative for unexpected weight change.  HENT: Negative for rhinorrhea.   Respiratory: Negative for cough.        Intermittent exertional shortness of breath   Cardiovascular: Negative for chest pain.  Gastrointestinal: Positive for constipation. Negative for diarrhea.  Genitourinary: Negative for difficulty urinating.  Musculoskeletal: Positive for arthralgias, back pain and myalgias.  Skin: Negative for rash.  Allergic/Immunologic: Positive for environmental allergies.  Neurological: Negative for dizziness, numbness and headaches.  Psychiatric/Behavioral: The patient is not nervous/anxious.        Past Medical History:  Diagnosis  Date  . BPH (benign prostatic hyperplasia) 09/16/2014  . Cataract    left eye  . Chronic back pain 09/16/2014  . Colon polyps   . COPD (chronic obstructive pulmonary disease) (HCC)    mild  . DDD (degenerative disc disease), lumbar 09/16/2014  . Full dentures   . Inguinal hernia 2004  . Insomnia 09/16/2014  . Personal history of tobacco use, presenting hazards to health 03/10/2016  . Prostate disorder   . Sleeping difficulties   . Tobacco use disorder 09/16/2014     Social History   Socioeconomic History  . Marital status: Married    Spouse name: Not on file  . Number of children: Not on file  . Years of education: Not on file  . Highest education level: Not on file  Occupational History  . Not on file  Tobacco Use  . Smoking status: Former Smoker    Years: 20.00    Types: Cigars    Quit date: 11/04/2018    Years since quitting: 1.4  . Smokeless tobacco: Never Used  Vaping Use  . Vaping Use: Never used  Substance and Sexual Activity  . Alcohol use: Yes    Alcohol/week: 0.0 standard drinks    Comment: occassionally on social occassions  . Drug use: No  . Sexual activity: Yes  Other Topics Concern  . Not on file  Social History Narrative   ** Merged History Encounter **       Work or School: no work currently, disabled from back injury in 2012 Home Situation: lives with wife Spiritual Beliefs: none Lifestyle: walks dog 2x per day;  Healthy diet per his report. Enjoys spending  time outdoors.      Social Determinants of Health   Financial Resource Strain:   . Difficulty of Paying Living Expenses:   Food Insecurity:   . Worried About Charity fundraiser in the Last Year:   . Arboriculturist in the Last Year:   Transportation Needs:   . Film/video editor (Medical):   Marland Kitchen Lack of Transportation (Non-Medical):   Physical Activity:   . Days of Exercise per Week:   . Minutes of Exercise per Session:   Stress:   . Feeling of Stress :   Social Connections:     . Frequency of Communication with Friends and Family:   . Frequency of Social Gatherings with Friends and Family:   . Attends Religious Services:   . Active Member of Clubs or Organizations:   . Attends Archivist Meetings:   Marland Kitchen Marital Status:   Intimate Partner Violence:   . Fear of Current or Ex-Partner:   . Emotionally Abused:   Marland Kitchen Physically Abused:   . Sexually Abused:     Past Surgical History:  Procedure Laterality Date  . ABDOMINAL EXPOSURE N/A 05/09/2015   Procedure: ABDOMINAL EXPOSURE;  Surgeon: Rosetta Posner, MD;  Location: [place] [facility];  Service: Vascular;  Laterality: N/A;  . ANTERIOR LUMBAR FUSION N/A 05/09/2015   Procedure: LUMBAR FOUR-FIVE ANTERIOR LUMBAR FUSION WITH ABDOMINAL EXPOSURE;  Surgeon: Eustace Moore, MD;  Location: [place] [facility];  Service: Neurosurgery;  Laterality: N/A;  . COLONOSCOPY W/ BIOPSIES AND POLYPECTOMY    . COLONOSCOPY WITH PROPOFOL N/A 03/18/2017   Procedure: COLONOSCOPY WITH PROPOFOL;  Surgeon: [name], MD;  Location: [facility];  Service: Endoscopy;  Laterality: N/A;  . GANGLION CYST EXCISION     left leg  . HERNIA REPAIR Left    inguinal at 14   . TOOTH EXTRACTION      Family History  Problem Relation Age of Onset  . Cancer Mother        Lung  . Heart disease Mother   . Cancer Father        Lung with mets to brain  . Cancer Sister        Lung    No Known Allergies  Current Outpatient Medications on File Prior to Visit  Medication Sig Dispense Refill  . albuterol (VENTOLIN HFA) 108 (90 Base) MCG/ACT inhaler Inhale 2 puffs into the lungs every 6 (six) hours as needed for wheezing or shortness of breath. 8 g 0  . atorvastatin (LIPITOR) 10 MG tablet TAKE ONE TABLET BY MOUTH EVERY EVENING 90 tablet 0  . tadalafil (CIALIS) 5 MG tablet Take 1 tablet by mouth 60 minutes prior to intercourse as needed. 30 tablet 0  . traZODone (DESYREL) 100 MG tablet TAKE ONE TABLET BY MOUTH EVERY NIGHT AT BEDTIME AS NEEDED FOR SLEEP 90  tablet 1   No current facility-administered medications on file prior to visit.    BP 122/78   Pulse 62   Temp (!) 95.6 F (35.3 C) (Temporal)   Ht 6' (1.829 m)   Wt 140 lb 8 oz (63.7 kg)   SpO2 100%   BMI 19.06 kg/m    Objective:   Physical Exam HENT:     Right Ear: Tympanic membrane and ear canal normal.     Left Ear: Tympanic membrane and ear canal normal.  Eyes:     Pupils: Pupils are equal, round, and reactive to light.  Cardiovascular:  Rate and Rhythm: Normal rate and regular rhythm.  Pulmonary:     Effort: Pulmonary effort is normal.     Breath sounds: Normal breath sounds.  Abdominal:     General: Bowel sounds are normal.     Palpations: Abdomen is soft.     Tenderness: There is no abdominal tenderness.  Musculoskeletal:        General: Normal range of motion.     Cervical back: Neck supple.     Comments: Tenderness to bilateral calves on palpation  Skin:    General: Skin is warm and dry.  Neurological:     Mental Status: He is alert and oriented to person, place, and time.     Cranial Nerves: No cranial nerve deficit.     Deep Tendon Reflexes:     Reflex Scores:      Patellar reflexes are 2+ on the right side and 2+ on the left side.           Assessment & Plan:

## 2020-04-04 NOTE — Telephone Encounter (Signed)
Patient has been notified that the low dose lung cancer screening CT scan is due currently or will be in near future.  Confirmed that patient is within the appropriate age range and asymptomatic, (no signs or symptoms of lung cancer).  Patient denies illness that would prevent curative treatment for lung cancer if found.  Patient is agreeable for CT scan being scheduled.    Verified smoking history (former smoker, quit 11/2018 with 20 year 1 ppd history.)  CT scheduled for 04/18/2020 @ 8:00

## 2020-04-04 NOTE — Assessment & Plan Note (Signed)
Prevnar due today, other immunizations UTD. PSA due next check. Colonoscopy UTD, due in 2028. Encouraged a healthy diet and regular exercise. Exam today as noted. Labs reviewed.

## 2020-04-08 NOTE — Addendum Note (Signed)
Addended by: Lieutenant Diego on: 04/08/2020 02:41 PM   Modules accepted: Orders

## 2020-04-08 NOTE — Telephone Encounter (Signed)
Smoking history: former, quit 12/03/18, 37 pack year

## 2020-04-18 ENCOUNTER — Other Ambulatory Visit: Payer: Self-pay

## 2020-04-18 ENCOUNTER — Ambulatory Visit
Admission: RE | Admit: 2020-04-18 | Discharge: 2020-04-18 | Disposition: A | Discharge status: Home / Self Care | Payer: Medicare Other | Source: Ambulatory Visit | Attending: Oncology | Admitting: Oncology

## 2020-04-18 DIAGNOSIS — Z122 Encounter for screening for malignant neoplasm of respiratory organs: Secondary | ICD-10-CM | POA: Diagnosis not present

## 2020-04-18 DIAGNOSIS — Z87891 Personal history of nicotine dependence: Secondary | ICD-10-CM | POA: Insufficient documentation

## 2020-04-23 ENCOUNTER — Encounter: Payer: Self-pay | Admitting: *Deleted

## 2020-04-28 ENCOUNTER — Other Ambulatory Visit: Payer: Self-pay

## 2020-04-28 DIAGNOSIS — I739 Peripheral vascular disease, unspecified: Secondary | ICD-10-CM

## 2020-04-29 ENCOUNTER — Ambulatory Visit (INDEPENDENT_AMBULATORY_CARE_PROVIDER_SITE_OTHER): Payer: Medicare Other | Admitting: Vascular Surgery

## 2020-04-29 ENCOUNTER — Ambulatory Visit (HOSPITAL_COMMUNITY)
Admission: RE | Admit: 2020-04-29 | Discharge: 2020-04-29 | Disposition: A | Discharge status: Home / Self Care | Payer: Medicare Other | Source: Ambulatory Visit | Attending: Vascular Surgery | Admitting: Vascular Surgery

## 2020-04-29 ENCOUNTER — Encounter: Payer: Self-pay | Admitting: Vascular Surgery

## 2020-04-29 ENCOUNTER — Other Ambulatory Visit: Payer: Self-pay

## 2020-04-29 VITALS — BP 126/76 | HR 66 | Temp 97.5°F | Resp 18 | Ht 72.25 in | Wt 139.7 lb

## 2020-04-29 DIAGNOSIS — I739 Peripheral vascular disease, unspecified: Secondary | ICD-10-CM

## 2020-04-29 NOTE — Progress Notes (Signed)
[facility]  Patient name: Don Christian MRN: [id number] DOB: [birth year] Sex: male  REASON FOR CONSULT: Evaluation lower extremity discomfort  HPI: Don Christian is a 64 y.o. male, who is known to me from prior exposure for anterior lumbar interbody fusion with Dr. Sherley Bounds in 2016.  He presents today with concern regarding potential arterial insufficiency in his lower extremities.  He reports that he is always cold.  This is not limited only to his legs and feet.  His main complaint is of cramping most particularly in his right leg and most particularly in his calf although this can be in his thigh as well.  This is not related to walking.  This occurs frequently when he is driving and even when he is sitting still.  He has no history of lower extremity tissue loss.  Past Medical History:  Diagnosis Date  . BPH (benign prostatic hyperplasia) 09/16/2014  . Cataract    left eye  . Chronic back pain 09/16/2014  . Colon polyps   . COPD (chronic obstructive pulmonary disease) (HCC)    mild  . DDD (degenerative disc disease), lumbar 09/16/2014  . Full dentures   . Inguinal hernia 2004  . Insomnia 09/16/2014  . Personal history of tobacco use, presenting hazards to health 03/10/2016  . Prostate disorder   . S/P lumbar spinal fusion 05/09/2015  . Sleeping difficulties   . Tobacco use disorder 09/16/2014  . Varicella zoster 11/19/2015    Family History  Problem Relation Age of Onset  . Cancer Mother        Lung  . Heart disease Mother   . Cancer Father        Lung with mets to brain  . Cancer Sister        Lung    SOCIAL HISTORY: Social History   Socioeconomic History  . Marital status: Married    Spouse name: Not on file  . Number of children: Not on file  . Years of education: Not on file  . Highest education level: Not on file  Occupational History  . Not on file  Tobacco Use  . Smoking status: Former  Smoker    Years: 20.00    Types: Cigars    Quit date: 11/04/2018    Years since quitting: 1.4  . Smokeless tobacco: Never Used  Vaping Use  . Vaping Use: Never used  Substance and Sexual Activity  . Alcohol use: Yes    Alcohol/week: 0.0 standard drinks    Comment: occassionally on social occassions  . Drug use: No  . Sexual activity: Yes  Other Topics Concern  . Not on file  Social History Narrative   ** Merged History Encounter **       Work or School: no work currently, disabled from back injury in 2012 Home Situation: lives with wife Spiritual Beliefs: none Lifestyle: walks dog 2x per day;  Healthy diet per his report. Enjoys spending time outdoors.      Social Determinants of Health   Financial Resource Strain:   . Difficulty of Paying Living Expenses:   Food Insecurity:   . Worried About Charity fundraiser in the Last Year:   . Arboriculturist in the Last Year:   Transportation Needs:   . Film/video editor (Medical):   Marland Kitchen Lack of Transportation (Non-Medical):   Physical Activity:   . Days of Exercise per Week:   . Minutes of Exercise per  Session:   Stress:   . Feeling of Stress :   Social Connections:   . Frequency of Communication with Friends and Family:   . Frequency of Social Gatherings with Friends and Family:   . Attends Religious Services:   . Active Member of Clubs or Organizations:   . Attends Archivist Meetings:   Marland Kitchen Marital Status:   Intimate Partner Violence:   . Fear of Current or Ex-Partner:   . Emotionally Abused:   Marland Kitchen Physically Abused:   . Sexually Abused:     No Known Allergies  Current Outpatient Medications  Medication Sig Dispense Refill  . albuterol (VENTOLIN HFA) 108 (90 Base) MCG/ACT inhaler Inhale 2 puffs into the lungs every 6 (six) hours as needed for wheezing or shortness of breath. 8 g 0  . buPROPion (ZYBAN) 150 MG 12 hr tablet Take 1 tablet (150 mg total) by mouth daily. 90 tablet 3  . tadalafil (CIALIS) 5 MG  tablet Take 1 tablet by mouth 60 minutes prior to intercourse as needed. 30 tablet 0  . traZODone (DESYREL) 100 MG tablet TAKE ONE TABLET BY MOUTH EVERY NIGHT AT BEDTIME AS NEEDED FOR SLEEP 90 tablet 1  . atorvastatin (LIPITOR) 10 MG tablet TAKE ONE TABLET BY MOUTH EVERY EVENING (Patient not taking: Reported on 04/29/2020) 90 tablet 0   No current facility-administered medications for this visit.    REVIEW OF SYSTEMS:  [X]  denotes positive finding, [ ]  denotes negative finding Cardiac  Comments:  Chest pain or chest pressure:    Shortness of breath upon exertion: x   Short of breath when lying flat:    Irregular heart rhythm:        Vascular    Pain in calf, thigh, or hip brought on by ambulation: x   Pain in feet at night that wakes you up from your sleep:     Blood clot in your veins:    Leg swelling:         Pulmonary    Oxygen at home:    Productive cough:     Wheezing:         Neurologic    Sudden weakness in arms or legs:     Sudden numbness in arms or legs:     Sudden onset of difficulty speaking or slurred speech:    Temporary loss of vision in one eye:     Problems with dizziness:         Gastrointestinal    Blood in stool:     Vomited blood:         Genitourinary    Burning when urinating:     Blood in urine:        Psychiatric    Major depression:         Hematologic    Bleeding problems:    Problems with blood clotting too easily:        Skin    Rashes or ulcers:        Constitutional    Fever or chills:      PHYSICAL EXAM: Vitals:   04/29/20 0926  BP: 126/76  Pulse: 66  Resp: 18  Temp: (!) 97.5 F (36.4 C)  TempSrc: Temporal  SpO2: (!) 55%  Weight: 139 lb 11.2 oz (63.4 kg)  Height: 6' 0.25" (1.835 m)    GENERAL: The patient is a well-nourished male, in no acute distress. The vital signs are documented above. CARDIOVASCULAR: Carotid arteries without bruits bilaterally.  2+ radial 2+ femoral 2+ popliteal and 2+ dorsalis pedis pulses  bilaterally PULMONARY: There is good air exchange  ABDOMEN: Soft and non-tender  MUSCULOSKELETAL: There are no major deformities or cyanosis. NEUROLOGIC: No focal weakness or paresthesias are detected. SKIN: There are no ulcers or rashes noted. PSYCHIATRIC: The patient has a normal affect.  DATA:  Normal ankle arm index greater than 1 bilaterally. Normal arterial waveforms at the posterior tibial and dorsalis pedis bilaterally  MEDICAL ISSUES: No evidence of arterial insufficiency.  He did have calcification of his aortoiliac segments prior to his spinal exposure.  I have discussed this with him at that time.  I did explain that this is not causing any flow limitation.  I reassured him that his symptoms are not related to arterial insufficiency.  He was pleased with this report and will see [place] again on an as-needed basis   Rosetta Posner, MD [facility] Tel [phone] Pager [phone]

## 2020-06-08 ENCOUNTER — Other Ambulatory Visit: Payer: Self-pay | Admitting: Primary Care

## 2020-06-08 DIAGNOSIS — N529 Male erectile dysfunction, unspecified: Secondary | ICD-10-CM

## 2020-06-10 NOTE — Telephone Encounter (Signed)
Refills sent to pharmacy. 

## 2020-07-02 ENCOUNTER — Other Ambulatory Visit: Payer: Self-pay | Admitting: Primary Care

## 2020-10-15 ENCOUNTER — Telehealth: Payer: Self-pay | Admitting: Primary Care

## 2020-10-15 NOTE — Telephone Encounter (Signed)
LVM for pt to rtn my call to schedule AWV with [facility].  

## 2020-10-19 DIAGNOSIS — N529 Male erectile dysfunction, unspecified: Secondary | ICD-10-CM

## 2020-10-21 MED ORDER — TADALAFIL 10 MG PO TABS: ORAL_TABLET | ORAL | 0 refills | Status: DC

## 2020-11-12 ENCOUNTER — Other Ambulatory Visit: Payer: Self-pay

## 2020-11-12 ENCOUNTER — Ambulatory Visit (INDEPENDENT_AMBULATORY_CARE_PROVIDER_SITE_OTHER): Payer: Medicare Other

## 2020-11-12 DIAGNOSIS — Z0000 Encounter for general adult medical examination without abnormal findings: Secondary | ICD-10-CM | POA: Diagnosis not present

## 2020-11-12 NOTE — Progress Notes (Signed)
Subjective:   Don Christian is a 65 y.o. male who presents for Medicare Annual/Subsequent preventive examination.  Review of Systems: N/A   I connected with the patient today by telephone and verified that I am speaking with the correct person using two identifiers. Location patient: home Location nurse: work Persons participating in the telephone visit: patient, nurse.   I discussed the limitations, risks, security and privacy concerns of performing an evaluation and management service by telephone and the availability of in person appointments. I also discussed with the patient that there may be a patient responsible charge related to this service. The patient expressed understanding and verbally consented to this telephonic visit.        Cardiac Risk Factors include: advanced age (>71men, >50 women);male gender;Other (see comment), Risk factor comments: hyperlipidemia     Objective:    Today's Vitals   There is no height or weight on file to calculate BMI.  Advanced Directives 11/12/2020 03/30/2019 03/27/2018 03/18/2017 01/18/2017 05/09/2015 05/07/2015  Does Patient Have a Medical Advance Directive? No No No No No Yes No  Type of Advance Directive - - - - - Press photographer;Living will -  Would patient like information on creating a medical advance directive? No - Patient declined No - Patient declined Yes (MAU/Ambulatory/Procedural Areas - Information given) - - - Yes - Educational materials given    Current Medications (verified) Outpatient Encounter Medications as of 11/12/2020  Medication Sig  . albuterol (VENTOLIN HFA) 108 (90 Base) MCG/ACT inhaler Inhale 2 puffs into the lungs every 6 (six) hours as needed for wheezing or shortness of breath.  Marland Kitchen buPROPion (ZYBAN) 150 MG 12 hr tablet Take 1 tablet (150 mg total) by mouth daily.  . tadalafil (CIALIS) 10 MG tablet Take 1 tablet by mouth 60 min prior to intercourse as needed.  . traZODone (DESYREL) 100 MG tablet TAKE ONE  TABLET BY MOUTH EVERY NIGHT AT BEDTIME AS NEEDED FOR SLEEP  . atorvastatin (LIPITOR) 10 MG tablet TAKE ONE TABLET BY MOUTH EVERY EVENING (Patient not taking: Reported on 11/12/2020)   No facility-administered encounter medications on file as of 11/12/2020.    Allergies (verified) Patient has no known allergies.   History: Past Medical History:  Diagnosis Date  . BPH (benign prostatic hyperplasia) 09/16/2014  . Cataract    left eye  . Chronic back pain 09/16/2014  . Colon polyps   . COPD (chronic obstructive pulmonary disease) (HCC)    mild  . DDD (degenerative disc disease), lumbar 09/16/2014  . Full dentures   . Inguinal hernia 2004  . Insomnia 09/16/2014  . Personal history of tobacco use, presenting hazards to health 03/10/2016  . Prostate disorder   . S/P lumbar spinal fusion 05/09/2015  . Sleeping difficulties   . Tobacco use disorder 09/16/2014  . Varicella zoster 11/19/2015   Past Surgical History:  Procedure Laterality Date  . ABDOMINAL EXPOSURE N/A 05/09/2015   Procedure: ABDOMINAL EXPOSURE;  Surgeon: Rosetta Posner, MD;  Location: [facility];  Service: Vascular;  Laterality: N/A;  . ANTERIOR LUMBAR FUSION N/A 05/09/2015   Procedure: LUMBAR FOUR-FIVE ANTERIOR LUMBAR FUSION WITH ABDOMINAL EXPOSURE;  Surgeon: Eustace Moore, MD;  Location: [facility];  Service: Neurosurgery;  Laterality: N/A;  . COLONOSCOPY W/ BIOPSIES AND POLYPECTOMY    . COLONOSCOPY WITH PROPOFOL N/A 03/18/2017   Procedure: COLONOSCOPY WITH PROPOFOL;  Surgeon: [name], MD;  Location: [facility];  Service: Endoscopy;  Laterality: N/A;  . FOOT SURGERY  Right 2021  . GANGLION CYST EXCISION     left leg  . HERNIA REPAIR Left    inguinal at 14   . TOOTH EXTRACTION     Family History  Problem Relation Age of Onset  . Cancer Mother        Lung  . Heart disease Mother   . Cancer Father        Lung with mets to brain  . Cancer Sister        Lung   Social History   Socioeconomic History  . Marital  status: Married    Spouse name: Not on file  . Number of children: Not on file  . Years of education: Not on file  . Highest education level: Not on file  Occupational History  . Not on file  Tobacco Use  . Smoking status: Former Smoker    Years: 20.00    Types: Cigars    Quit date: 11/04/2018    Years since quitting: 2.0  . Smokeless tobacco: Never Used  Vaping Use  . Vaping Use: Never used  Substance and Sexual Activity  . Alcohol use: Yes    Alcohol/week: 0.0 standard drinks    Comment: occassionally on social occassions  . Drug use: No  . Sexual activity: Yes  Other Topics Concern  . Not on file  Social History Narrative   ** Merged History Encounter **       Work or School: no work currently, disabled from back injury in 2012 Home Situation: lives with wife Spiritual Beliefs: none Lifestyle: walks dog 2x per day;  Healthy diet per his report. Enjoys spending time outdoors.      Social Determinants of Health   Financial Resource Strain: Low Risk   . Difficulty of Paying Living Expenses: Not hard at all  Food Insecurity: No Food Insecurity  . Worried About Charity fundraiser in the Last Year: Never true  . Ran Out of Food in the Last Year: Never true  Transportation Needs: No Transportation Needs  . Lack of Transportation (Medical): No  . Lack of Transportation (Non-Medical): No  Physical Activity: Inactive  . Days of Exercise per Week: 0 days  . Minutes of Exercise per Session: 0 min  Stress: No Stress Concern Present  . Feeling of Stress : Not at all  Social Connections: Not on file    Tobacco Counseling Counseling given: Not Answered   Clinical Intake:  Pre-visit preparation completed: Yes  Pain : No/denies pain     Nutritional Risks: None Diabetes: No  How often do you need to have someone help you when you read instructions, pamphlets, or other written materials from your doctor or pharmacy?: 1 - Never What is the last grade level you  completed in school?: GED  Diabetic: No Nutrition Risk Assessment:  Has the patient had any N/V/D within the last 2 months?  No  Does the patient have any non-healing wounds?  No  Has the patient had any unintentional weight loss or weight gain?  No   Diabetes:  Is the patient diabetic?  No  If diabetic, was a CBG obtained today?  N/A Did the patient bring in their glucometer from home?  N/A How often do you monitor your CBG's? N/A.   Financial Strains and Diabetes Management:  Are you having any financial strains with the device, your supplies or your medication? N/A.  Does the patient want to be seen by Chronic Care Management for management  of their diabetes?  N/A Would the patient like to be referred to a Nutritionist or for Diabetic Management?  N/A    Interpreter Needed?: No  Information entered by :: CJohnson, LPN   Activities of Daily Living In your present state of health, do you have any difficulty performing the following activities: 11/12/2020  Hearing? N  Vision? N  Difficulty concentrating or making decisions? N  Walking or climbing stairs? N  Dressing or bathing? N  Doing errands, shopping? N  Preparing Food and eating ? N  Using the Toilet? N  In the past six months, have you accidently leaked urine? Y  Comment some leakage at times  Do you have problems with loss of bowel control? N  Managing your Medications? N  Managing your Finances? N  Housekeeping or managing your Housekeeping? N  Some recent data might be hidden    Patient Care Team: Pleas Koch, NP as PCP - General (Internal Medicine) Pleas Koch, NP (Nurse Practitioner) Robert Bellow, MD (General Surgery)  Indicate any recent Medical Services you may have received from other than [facility] providers in the past year (date may be approximate).     Assessment:   This is a routine wellness examination for [place].  Hearing/Vision screen  Hearing Screening   125Hz  250Hz   500Hz  1000Hz  2000Hz  3000Hz  4000Hz  6000Hz  8000Hz   Right ear:           Left ear:           Vision Screening Comments: Patient gets annual eye exams   Dietary issues and exercise activities discussed: Current Exercise Habits: The patient does not participate in regular exercise at present, Exercise limited by: None identified  Goals    . Increase physical activity     Starting 03/27/2018, I will continue to exercise for at least 30 minutes daily.     . Patient Stated     11/12/2020, I will maintain and continue medications as prescribed.       Depression Screen PHQ 2/9 Scores 11/12/2020 03/30/2019 03/27/2018 01/18/2017 01/27/2016  PHQ - 2 Score 0 0 0 0 4  PHQ- 9 Score 0 0 0 - 10    Fall Risk Fall Risk  11/12/2020 03/30/2019 03/27/2018 01/18/2017  Falls in the past year? 0 0 No No  Number falls in past yr: 0 - - -  Injury with Fall? 0 - - -  Risk for fall due to : No Fall Risks - - -  Follow up Falls evaluation completed;Falls prevention discussed - - -    FALL RISK PREVENTION PERTAINING TO THE HOME:  Any stairs in or around the home? Yes  If so, are there any without handrails? No  Home free of loose throw rugs in walkways, pet beds, electrical cords, etc? Yes  Adequate lighting in your home to reduce risk of falls? Yes   ASSISTIVE DEVICES UTILIZED TO PREVENT FALLS:  Life alert? No  Use of a cane, walker or w/c? No  Grab bars in the bathroom? No  Shower chair or bench in shower? No  Elevated toilet seat or a handicapped toilet? No   TIMED UP AND GO:  Was the test performed? N/A telephone visit.   Cognitive Function: MMSE - Mini Mental State Exam 11/12/2020 03/30/2019 03/27/2018 01/18/2017  Not completed: Refused - - -  Orientation to time - 5 5 5   Orientation to Place - 5 5 5   Registration - 3 3 3   Attention/ Calculation - 0  0 0  Recall - 3 3 3   Language- name 2 objects - 0 0 0  Language- repeat - 1 1 1   Language- follow 3 step command - 0 3 3  Language- read & follow  direction - 0 0 0  Write a sentence - 0 0 0  Copy design - 0 0 0  Total score - 17 20 20   Mini Cog  Mini-Cog screen was not completed. Patient does not feel the need to do this. Has no issues. Maximum score is 22. A value of 0 denotes this part of the MMSE was not completed or the patient failed this part of the Mini-Cog screening.       Immunizations Immunization History  Administered Date(s) Administered  . Influenza Inj Mdck Quad Pf 07/27/2018  . Influenza,inj,Quad PF,6+ Mos 09/16/2014, 08/11/2015, 07/12/2017, 06/19/2019  . Influenza-Unspecified 07/04/2020  . PFIZER(Purple Top)SARS-COV-2 Vaccination 01/03/2020, 01/28/2020, 08/02/2020  . Pneumococcal Conjugate-13 04/04/2020  . Pneumococcal Polysaccharide-23 04/18/2018  . Zoster Recombinat (Shingrix) 09/03/2016, 01/29/2017    TDAP status: Due, Education has been provided regarding the importance of this vaccine. Advised may receive this vaccine at local pharmacy or Health Dept. Aware to provide a copy of the vaccination record if obtained from local pharmacy or Health Dept. Verbalized acceptance and understanding.  Flu Vaccine status: Up to date  Pneumococcal vaccine status: Up to date  Covid-19 vaccine status: Completed vaccines  Qualifies for Shingles Vaccine? Yes   Zostavax completed No   Shingrix Completed?: Yes  Screening Tests Health Maintenance  Topic Date Due  . TETANUS/TDAP  11/12/2025 (Originally 10/05/2019)  . PNA vac Low Risk Adult (2 of 2 - PPSV23) 04/19/2023  . COLONOSCOPY (Pts 45-68yrs Insurance coverage will need to be confirmed)  03/19/2027  . INFLUENZA VACCINE  Completed  . COVID-19 Vaccine  Completed  . Hepatitis C Screening  Completed  . HIV Screening  Completed    Health Maintenance  There are no preventive care reminders to display for this patient.  Colorectal cancer screening: Type of screening: Colonoscopy. Completed 03/18/2017. Repeat every 10 years  Lung Cancer Screening: (Low Dose CT  Chest recommended if Age 61-80 years, 30 pack-year currently smoking OR have quit w/in 15 years.) does not qualify.    Additional Screening:  Hepatitis C Screening: does qualify; Completed 01/22/2016  Vision Screening: Recommended annual ophthalmology exams for early detection of glaucoma and other disorders of the eye. Is the patient up to date with their annual eye exam?  Yes  Who is the provider or what is the name of the office in which the patient attends annual eye exams? [place]  If pt is not established with a provider, would they like to be referred to a provider to establish care? No .   Dental Screening: Recommended annual dental exams for proper oral hygiene  Community Resource Referral / Chronic Care Management: CRR required this visit?  No   CCM required this visit?  No      Plan:     I have personally reviewed and noted the following in the patient's chart:   . Medical and social history . Use of alcohol, tobacco or illicit drugs  . Current medications and supplements . Functional ability and status . Nutritional status . Physical activity . Advanced directives . List of other physicians . Hospitalizations, surgeries, and ER visits in previous 12 months . Vitals . Screenings to include cognitive, depression, and falls . Referrals and appointments  In addition, I have reviewed  and discussed with patient certain preventive protocols, quality metrics, and best practice recommendations. A written personalized care plan for preventive services as well as general preventive health recommendations were provided to patient.   Due to this being a telephonic visit, the after visit summary with patients personalized plan was offered to patient via office or my-chart. Patient preferred to pick up at office at next visit or via mychart.   Andrez Grime, LPN   04/10/339

## 2020-11-12 NOTE — Patient Instructions (Signed)
Don Christian , Thank you for taking time to come for your Medicare Wellness Visit. I appreciate your ongoing commitment to your health goals. Please review the following plan we discussed and let me know if I can assist you in the future.   Screening recommendations/referrals: Colonoscopy: Up to date, completed 03/18/2017, due 03/2027 Recommended yearly ophthalmology/optometry visit for glaucoma screening and checkup Recommended yearly dental visit for hygiene and checkup  Vaccinations: Influenza vaccine: Up to date, completed 07/04/2020, due 05/2021 Pneumococcal vaccine: Completed series Tdap vaccine: decline-insurance Shingles vaccine: Completed series   Covid-19: Completed series  Advanced directives: Advance directive discussed with you today. Even though you declined this today please call our office should you change your mind and we can give you the proper paperwork for you to fill out.  Conditions/risks identified: hyperlipidemia   Next appointment: Follow up in one year for your annual wellness visit.   Preventive Care 66 Years and Older, Male Preventive care refers to lifestyle choices and visits with your health care provider that can promote health and wellness. What does preventive care include?  A yearly physical exam. This is also called an annual well check.  Dental exams once or twice a year.  Routine eye exams. Ask your health care provider how often you should have your eyes checked.  Personal lifestyle choices, including:  Daily care of your teeth and gums.  Regular physical activity.  Eating a healthy diet.  Avoiding tobacco and drug use.  Limiting alcohol use.  Practicing safe sex.  Taking low doses of aspirin every day.  Taking vitamin and mineral supplements as recommended by your health care provider. What happens during an annual well check? The services and screenings done by your health care provider during your annual well check will depend on  your age, overall health, lifestyle risk factors, and family history of disease. Counseling  Your health care provider may ask you questions about your:  Alcohol use.  Tobacco use.  Drug use.  Emotional well-being.  Home and relationship well-being.  Sexual activity.  Eating habits.  History of falls.  Memory and ability to understand (cognition).  Work and work Statistician. Screening  You may have the following tests or measurements:  Height, weight, and BMI.  Blood pressure.  Lipid and cholesterol levels. These may be checked every 5 years, or more frequently if you are over 40 years old.  Skin check.  Lung cancer screening. You may have this screening every year starting at age 66 if you have a 30-pack-year history of smoking and currently smoke or have quit within the past 15 years.  Fecal occult blood test (FOBT) of the stool. You may have this test every year starting at age 66.  Flexible sigmoidoscopy or colonoscopy. You may have a sigmoidoscopy every 5 years or a colonoscopy every 10 years starting at age 66.  Prostate cancer screening. Recommendations will vary depending on your family history and other risks.  Hepatitis C blood test.  Hepatitis B blood test.  Sexually transmitted disease (STD) testing.  Diabetes screening. This is done by checking your blood sugar (glucose) after you have not eaten for a while (fasting). You may have this done every 1-3 years.  Abdominal aortic aneurysm (AAA) screening. You may need this if you are a current or former smoker.  Osteoporosis. You may be screened starting at age 66 if you are at high risk. Talk with your health care provider about your test results, treatment options, and if necessary,  the need for more tests. Vaccines  Your health care provider may recommend certain vaccines, such as:  Influenza vaccine. This is recommended every year.  Tetanus, diphtheria, and acellular pertussis (Tdap, Td) vaccine.  You may need a Td booster every 66 years.  Zoster vaccine. You may need this after age 66.  Pneumococcal 13-valent conjugate (PCV13) vaccine. One dose is recommended after age 66.  Pneumococcal polysaccharide (PPSV23) vaccine. One dose is recommended after age 66. Talk to your health care provider about which screenings and vaccines you need and how often you need them. This information is not intended to replace advice given to you by your health care provider. Make sure you discuss any questions you have with your health care provider. Document Released: 10/17/2015 Document Revised: 06/09/2016 Document Reviewed: 07/22/2015 Elsevier Interactive Patient Education  2017 [place] Prevention in the Home Falls can cause injuries. They can happen to people of all ages. There are many things you can do to make your home safe and to help prevent falls. What can I do on the outside of my home?  Regularly fix the edges of walkways and driveways and fix any cracks.  Remove anything that might make you trip as you walk through a door, such as a raised step or threshold.  Trim any bushes or trees on the path to your home.  Use bright outdoor lighting.  Clear any walking paths of anything that might make someone trip, such as rocks or tools.  Regularly check to see if handrails are loose or broken. Make sure that both sides of any steps have handrails.  Any raised decks and porches should have guardrails on the edges.  Have any leaves, snow, or ice cleared regularly.  Use sand or salt on walking paths during winter.  Clean up any spills in your garage right away. This includes oil or grease spills. What can I do in the bathroom?  Use night lights.  Install grab bars by the toilet and in the tub and shower. Do not use towel bars as grab bars.  Use non-skid mats or decals in the tub or shower.  If you need to sit down in the shower, use a plastic, non-slip stool.  Keep the  floor dry. Clean up any water that spills on the floor as soon as it happens.  Remove soap buildup in the tub or shower regularly.  Attach bath mats securely with double-sided non-slip rug tape.  Do not have throw rugs and other things on the floor that can make you trip. What can I do in the bedroom?  Use night lights.  Make sure that you have a light by your bed that is easy to reach.  Do not use any sheets or blankets that are too big for your bed. They should not hang down onto the floor.  Have a firm chair that has side arms. You can use this for support while you get dressed.  Do not have throw rugs and other things on the floor that can make you trip. What can I do in the kitchen?  Clean up any spills right away.  Avoid walking on wet floors.  Keep items that you use a lot in easy-to-reach places.  If you need to reach something above you, use a strong step stool that has a grab bar.  Keep electrical cords out of the way.  Do not use floor polish or wax that makes floors slippery. If you must use  wax, use non-skid floor wax.  Do not have throw rugs and other things on the floor that can make you trip. What can I do with my stairs?  Do not leave any items on the stairs.  Make sure that there are handrails on both sides of the stairs and use them. Fix handrails that are broken or loose. Make sure that handrails are as long as the stairways.  Check any carpeting to make sure that it is firmly attached to the stairs. Fix any carpet that is loose or worn.  Avoid having throw rugs at the top or bottom of the stairs. If you do have throw rugs, attach them to the floor with carpet tape.  Make sure that you have a light switch at the top of the stairs and the bottom of the stairs. If you do not have them, ask someone to add them for you. What else can I do to help prevent falls?  Wear shoes that:  Do not have high heels.  Have rubber bottoms.  Are comfortable and fit  you well.  Are closed at the toe. Do not wear sandals.  If you use a stepladder:  Make sure that it is fully opened. Do not climb a closed stepladder.  Make sure that both sides of the stepladder are locked into place.  Ask someone to hold it for you, if possible.  Clearly mark and make sure that you can see:  Any grab bars or handrails.  First and last steps.  Where the edge of each step is.  Use tools that help you move around (mobility aids) if they are needed. These include:  Canes.  Walkers.  Scooters.  Crutches.  Turn on the lights when you go into a dark area. Replace any light bulbs as soon as they burn out.  Set up your furniture so you have a clear path. Avoid moving your furniture around.  If any of your floors are uneven, fix them.  If there are any pets around you, be aware of where they are.  Review your medicines with your doctor. Some medicines can make you feel dizzy. This can increase your chance of falling. Ask your doctor what other things that you can do to help prevent falls. This information is not intended to replace advice given to you by your health care provider. Make sure you discuss any questions you have with your health care provider. Document Released: 07/17/2009 Document Revised: 02/26/2016 Document Reviewed: 10/25/2014 Elsevier Interactive Patient Education  2017 [employer].

## 2020-11-12 NOTE — Progress Notes (Signed)
PCP notes:  Health Maintenance: No gaps noted   Abnormal Screenings: none   Patient concerns: Right leg cramping when driving    Nurse concerns: none   Next PCP appt.: none

## 2020-12-28 ENCOUNTER — Other Ambulatory Visit: Payer: Self-pay | Admitting: Primary Care

## 2020-12-28 DIAGNOSIS — G4700 Insomnia, unspecified: Secondary | ICD-10-CM

## 2020-12-28 NOTE — Telephone Encounter (Signed)
Patient needs office visit in July, please set up CPE for that time frame.

## 2020-12-29 NOTE — Telephone Encounter (Signed)
Called patient to schedule AWV. Scheduled for 7/6.

## 2021-02-08 ENCOUNTER — Other Ambulatory Visit: Payer: Self-pay | Admitting: Primary Care

## 2021-02-08 DIAGNOSIS — N529 Male erectile dysfunction, unspecified: Secondary | ICD-10-CM

## 2021-03-29 ENCOUNTER — Other Ambulatory Visit: Payer: Self-pay | Admitting: Primary Care

## 2021-03-29 DIAGNOSIS — G4700 Insomnia, unspecified: Secondary | ICD-10-CM

## 2021-04-08 ENCOUNTER — Encounter: Payer: Self-pay | Admitting: Primary Care

## 2021-04-08 ENCOUNTER — Other Ambulatory Visit: Payer: Self-pay | Admitting: Primary Care

## 2021-04-08 ENCOUNTER — Ambulatory Visit (INDEPENDENT_AMBULATORY_CARE_PROVIDER_SITE_OTHER): Payer: Medicare Other | Admitting: Primary Care

## 2021-04-08 ENCOUNTER — Other Ambulatory Visit: Payer: Self-pay

## 2021-04-08 VITALS — BP 118/74 | HR 77 | Temp 97.6°F | Ht 72.0 in | Wt 136.0 lb

## 2021-04-08 DIAGNOSIS — Z125 Encounter for screening for malignant neoplasm of prostate: Secondary | ICD-10-CM

## 2021-04-08 DIAGNOSIS — J439 Emphysema, unspecified: Secondary | ICD-10-CM

## 2021-04-08 DIAGNOSIS — M5136 Other intervertebral disc degeneration, lumbar region: Secondary | ICD-10-CM

## 2021-04-08 DIAGNOSIS — R7303 Prediabetes: Secondary | ICD-10-CM | POA: Diagnosis not present

## 2021-04-08 DIAGNOSIS — G4700 Insomnia, unspecified: Secondary | ICD-10-CM

## 2021-04-08 DIAGNOSIS — Z0000 Encounter for general adult medical examination without abnormal findings: Secondary | ICD-10-CM | POA: Diagnosis not present

## 2021-04-08 DIAGNOSIS — F17200 Nicotine dependence, unspecified, uncomplicated: Secondary | ICD-10-CM | POA: Diagnosis not present

## 2021-04-08 DIAGNOSIS — N529 Male erectile dysfunction, unspecified: Secondary | ICD-10-CM

## 2021-04-08 DIAGNOSIS — F3342 Major depressive disorder, recurrent, in full remission: Secondary | ICD-10-CM

## 2021-04-08 DIAGNOSIS — E782 Mixed hyperlipidemia: Secondary | ICD-10-CM

## 2021-04-08 DIAGNOSIS — I700 Atherosclerosis of aorta: Secondary | ICD-10-CM | POA: Diagnosis not present

## 2021-04-08 DIAGNOSIS — N400 Enlarged prostate without lower urinary tract symptoms: Secondary | ICD-10-CM

## 2021-04-08 LAB — COMPREHENSIVE METABOLIC PANEL
ALT: 10 U/L (ref 0–53)
AST: 14 U/L (ref 0–37)
Albumin: 4.2 g/dL (ref 3.5–5.2)
Alkaline Phosphatase: 68 U/L (ref 39–117)
BUN: 12 mg/dL (ref 6–23)
CO2: 31 mEq/L (ref 19–32)
Calcium: 9.3 mg/dL (ref 8.4–10.5)
Chloride: 101 mEq/L (ref 96–112)
Creatinine, Ser: 1.01 mg/dL (ref 0.40–1.50)
GFR: 77.71 mL/min (ref >60.00)
Glucose, Bld: 90 mg/dL (ref 70–99)
Potassium: 4.3 mEq/L (ref 3.5–5.1)
Sodium: 137 mEq/L (ref 135–145)
Total Bilirubin: 0.5 mg/dL (ref 0.2–1.2)
Total Protein: 6.4 g/dL (ref 6.0–8.3)

## 2021-04-08 LAB — HEMOGLOBIN A1C: Hgb A1c MFr Bld: 5.8 % (ref 4.6–6.5)

## 2021-04-08 LAB — LIPID PANEL
Cholesterol: 185 mg/dL (ref 0–200)
HDL: 61.3 mg/dL (ref >39.00)
LDL Cholesterol: 114 mg/dL — ABNORMAL HIGH (ref 0–99)
NonHDL: 124.1
Total CHOL/HDL Ratio: 3
Triglycerides: 51 mg/dL (ref 0.0–149.0)
VLDL: 10.2 mg/dL (ref 0.0–40.0)

## 2021-04-08 LAB — PSA, MEDICARE: PSA: 2.86 ng/ml (ref 0.10–4.00)

## 2021-04-08 MED ORDER — TRAZODONE HCL 100 MG PO TABS: 100.0000 mg | ORAL_TABLET | Freq: Every evening | ORAL | 3 refills | Status: DC | PRN

## 2021-04-08 MED ORDER — BUPROPION HCL ER (SMOKING DET) 150 MG PO TB12: 150.0000 mg | ORAL_TABLET | Freq: Every day | ORAL | 3 refills | Status: DC

## 2021-04-08 MED ORDER — PRAVASTATIN SODIUM 40 MG PO TABS: 40.0000 mg | ORAL_TABLET | Freq: Every day | ORAL | 3 refills | Status: DC

## 2021-04-08 NOTE — Assessment & Plan Note (Signed)
Immunizations UTD. PSA due and pending. Lung cancer screening due later this month. Colonoscopy UTD, due in 2028.  Exam today stable Labs pending.

## 2021-04-08 NOTE — Assessment & Plan Note (Signed)
No concerns today, no longer on tamsulosin

## 2021-04-08 NOTE — Progress Notes (Signed)
Subjective:    Patient ID: Don Christian, male    DOB: 10/09/1955, 65 y.o.   MRN: [id number]  HPI  Don Christian is a very pleasant 65 y.o. male who presents today for complete physical.  Immunizations: -Influenza: Due this season  -Covid-19: 3 vaccines -Shingles: Completed Shingrix -Pneumonia: Prevnar 13 in 2021, Pneumovax in 2019  Diet: [place].  Exercise: He is active in the yard.   Eye exam: Completes annually  Dental exam: Completes semi-annually   Colonoscopy: Completed in 2018, due 2028 PSA: Due  Lung Cancer Screening: Completed in July 2021   BP Readings from Last 3 Encounters:  04/08/21 118/74  04/29/20 126/76  04/04/20 122/78      Review of Systems  Constitutional:  Negative for unexpected weight change.  HENT:  Negative for rhinorrhea.        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 Intermittent ringing in the ears, not concerned.   Respiratory:  Negative for cough and shortness of breath.   Cardiovascular:  Negative for chest pain.  Gastrointestinal:  Negative for constipation and diarrhea.  Genitourinary:  Negative for difficulty urinating.  Musculoskeletal:  Negative for arthralgias and myalgias.  Skin:  Negative for rash.  Allergic/Immunologic: Negative for environmental allergies.  Neurological:  Negative for dizziness and headaches.  Psychiatric/Behavioral:  The patient is not nervous/anxious.         Past Medical History:  Diagnosis Date   BPH (benign prostatic hyperplasia) 09/16/2014   Cataract    left eye   Chronic back pain 09/16/2014   Colon polyps    COPD (chronic obstructive pulmonary disease) ([place])    mild   DDD (degenerative disc disease), lumbar 09/16/2014   Full dentures    Inguinal hernia 2004   Insomnia 09/16/2014   Personal history of tobacco use, presenting hazards to health 03/10/2016   Prostate disorder    S/P lumbar spinal fusion 05/09/2015   Sleeping difficulties    Tobacco use disorder  09/16/2014   Varicella zoster 11/19/2015    Social History   Socioeconomic History   Marital status: Married    Spouse name: Not on file   Number of children: Not on file   Years of education: Not on file   Highest education level: Not on file  Occupational History   Not on file  Tobacco Use   Smoking status: Former    Pack years: 0.00    Types: Cigars    Quit date: 11/04/2018    Years since quitting: 2.4   Smokeless tobacco: Never  Vaping Use   Vaping Use: Never used  Substance and Sexual Activity   Alcohol use: Yes    Alcohol/week: 0.0 standard drinks    Comment: occassionally on social occassions   Drug use: No   Sexual activity: Yes  Other Topics Concern   Not on file  Social History Narrative   ** Merged History Encounter **       Work or School: no work currently, disabled from back injury in 2012 Home Situation: lives with wife Spiritual Beliefs: none Lifestyle: walks dog 2x per day;  Healthy diet per his report. Enjoys spending time outdoors.      Social Determinants of Health   Financial Resource Strain: Low Risk    Difficulty of Paying Living Expenses: Not hard at all  Food Insecurity: No Food Insecurity   Worried About Charity fundraiser in the Last Year: Never true   [employer]  in the Last Year: Never true  Transportation Needs: No Transportation Needs   Lack of Transportation (Medical): No   Lack of Transportation (Non-Medical): No  Physical Activity: Inactive   Days of Exercise per Week: 0 days   Minutes of Exercise per Session: 0 min  Stress: No Stress Concern Present   Feeling of Stress : Not at all  Social Connections: Not on file  Intimate Partner Violence: Not At Risk   Fear of Current or Ex-Partner: No   Emotionally Abused: No   Physically Abused: No   Sexually Abused: No    Past Surgical History:  Procedure Laterality Date   ABDOMINAL EXPOSURE N/A 05/09/2015   Procedure: ABDOMINAL EXPOSURE;  Surgeon: Rosetta Posner, MD;   Location: [facility];  Service: Vascular;  Laterality: N/A;   ANTERIOR LUMBAR FUSION N/A 05/09/2015   Procedure: LUMBAR FOUR-FIVE ANTERIOR LUMBAR FUSION WITH ABDOMINAL EXPOSURE;  Surgeon: Eustace Moore, MD;  Location: [facility];  Service: Neurosurgery;  Laterality: N/A;   COLONOSCOPY W/ BIOPSIES AND POLYPECTOMY     COLONOSCOPY WITH PROPOFOL N/A 03/18/2017   Procedure: COLONOSCOPY WITH PROPOFOL;  Surgeon: [name], MD;  Location: [facility] END[facility];  Service: Endoscopy;  Laterality: N/A;   FOOT SURGERY Right 2021   GANGLION CYST EXCISION     left leg   HERNIA REPAIR Left    inguinal at 20    TOOTH EXTRACTION      Family History  Problem Relation Age of Onset   Cancer Mother        Lung   Heart disease Mother    Cancer Father        Lung with mets to brain   Cancer Sister        Lung    No Known Allergies  Current Outpatient Medications on File Prior to Visit  Medication Sig Dispense Refill   albuterol (VENTOLIN HFA) 108 (90 Base) MCG/ACT inhaler Inhale 2 puffs into the lungs every 6 (six) hours as needed for wheezing or shortness of breath. 8 g 0   atorvastatin (LIPITOR) 10 MG tablet TAKE ONE TABLET BY MOUTH EVERY EVENING (Patient not taking: Reported on 11/12/2020) 90 tablet 0   buPROPion (ZYBAN) 150 MG 12 hr tablet Take 1 tablet (150 mg total) by mouth daily. 90 tablet 3   tadalafil (CIALIS) 10 MG tablet TAKE 1 TABLET BY MOUTH 60 MIN PRIOR TO INTERCOURSE AS NEEDED 30 tablet 0   traZODone (DESYREL) 100 MG tablet TAKE ONE TABLET BY MOUTH EVERY NIGHT AT BEDTIME AS NEEDED FOR SLEEP 30 tablet 0   No current facility-administered medications on file prior to visit.    There were no vitals taken for this visit. Objective:   Physical Exam HENT:     Right Ear: Tympanic membrane and ear canal normal.     Left Ear: Tympanic membrane and ear canal normal.     Nose: Nose normal.     Right Sinus: No maxillary sinus tenderness or frontal sinus tenderness.     Left Sinus: No maxillary  sinus tenderness or frontal sinus tenderness.  Eyes:     Conjunctiva/sclera: Conjunctivae normal.  Neck:     Thyroid: No thyromegaly.     Vascular: No carotid bruit.  Cardiovascular:     Rate and Rhythm: Normal rate and regular rhythm.     Heart sounds: Normal heart sounds.  Pulmonary:     Effort: Pulmonary effort is normal.     Breath sounds: Normal breath sounds. No  wheezing or rales.  Abdominal:     General: Bowel sounds are normal.     Palpations: Abdomen is soft.     Tenderness: There is no abdominal tenderness.  Musculoskeletal:        General: Normal range of motion.     Cervical back: Neck supple.  Skin:    General: Skin is warm and dry.  Neurological:     Mental Status: He is alert and oriented to person, place, and time.     Cranial Nerves: No cranial nerve deficit.     Deep Tendon Reflexes: Reflexes are normal and symmetric.  Psychiatric:        Mood and Affect: Mood normal.          Assessment & Plan:      This visit occurred during the SARS-CoV-2 public health emergency.  Safety protocols were in place, including screening questions prior to the visit, additional usage of staff PPE, and extensive cleaning of exam room while observing appropriate contact time as indicated for disinfecting solutions.

## 2021-04-08 NOTE — Assessment & Plan Note (Signed)
Not on atorvastatin due to lower extremity cramping. Repeat lipid panel pending today. Will consider switching to pravastatin.  Await results.

## 2021-04-08 NOTE — Assessment & Plan Note (Signed)
Chronic continued, offered PT and further work up, he declines.

## 2021-04-08 NOTE — Assessment & Plan Note (Signed)
No longer on atorvastatin 10 mg due to myalgias.  Consider pravastatin, await lipid panel results.

## 2021-04-08 NOTE — Assessment & Plan Note (Signed)
Continues to do well on bupropion SR 150 mg daily. Lung cancer screening due later this month.

## 2021-04-08 NOTE — Assessment & Plan Note (Signed)
Doing well on trazodone 100 mg HS, continue same.

## 2021-04-08 NOTE — Assessment & Plan Note (Signed)
Stable, no concerns today. Continue bupropion SR 150 mg.

## 2021-04-08 NOTE — Assessment & Plan Note (Signed)
Commended him on regular activity. Repeat A1C pending.

## 2021-04-08 NOTE — Assessment & Plan Note (Signed)
Doing well on PRN cialis 10 mg, continue same.

## 2021-04-08 NOTE — Patient Instructions (Signed)
Stop by the lab prior to leaving today. I will notify you of your results once received.   Due for lung cancer screening in July 2022.  It was a pleasure to see you today!  Preventive Care 66 Years and Older, Male Preventive care refers to lifestyle choices and visits with your health care provider that can promote health and wellness. This includes: A yearly physical exam. This is also called an annual wellness visit. Regular dental and eye exams. Immunizations. Screening for certain conditions. Healthy lifestyle choices, such as: Eating a healthy diet. Getting regular exercise. Not using drugs or products that contain nicotine and tobacco. Limiting alcohol use. What can I expect for my preventive care visit? Physical exam Your health care provider will check your: Height and weight. These may be used to calculate your BMI (body mass index). BMI is a measurement that tells if you are at a healthy weight. Heart rate and blood pressure. Body temperature. Skin for abnormal spots. Counseling Your health care provider may ask you questions about your: Past medical problems. Family's medical history. Alcohol, tobacco, and drug use. Emotional well-being. Home life and relationship well-being. Sexual activity. Diet, exercise, and sleep habits. History of falls. Memory and ability to understand (cognition). Work and work Statistician. Access to firearms. What immunizations do I need?  Vaccines are usually given at various ages, according to a schedule. Your health care provider will recommend vaccines for you based on your age, medicalhistory, and lifestyle or other factors, such as travel or where you work. What tests do I need? Blood tests Lipid and cholesterol levels. These may be checked every 5 years, or more often depending on your overall health. Hepatitis C test. Hepatitis B test. Screening Lung cancer screening. You may have this screening every year starting at age 66  if you have a 30-pack-year history of smoking and currently smoke or have quit within the past 15 years. Colorectal cancer screening. All adults should have this screening starting at age 66 and continuing until age 66. Your health care provider may recommend screening at age 66 if you are at increased risk. You will have tests every 1-10 years, depending on your results and the type of screening test. Prostate cancer screening. Recommendations will vary depending on your family history and other risks. Genital exam to check for testicular cancer or hernias. Diabetes screening. This is done by checking your blood sugar (glucose) after you have not eaten for a while (fasting). You may have this done every 1-3 years. Abdominal aortic aneurysm (AAA) screening. You may need this if you are a current or former smoker. STD (sexually transmitted disease) testing, if you are at risk. Follow these instructions at home: Eating and drinking  Eat a diet that includes fresh fruits and vegetables, whole grains, lean protein, and low-fat dairy products. Limit your intake of foods with high amounts of sugar, saturated fats, and salt. Take vitamin and mineral supplements as recommended by your health care provider. Do not drink alcohol if your health care provider tells you not to drink. If you drink alcohol: Limit how much you have to 0-2 drinks a day. Be aware of how much alcohol is in your drink. In the [place] one drink equals one 12 oz bottle of beer (355 mL), one 5 oz glass of wine (148 mL), or one 1 oz glass of hard liquor (44 mL).  Lifestyle Take daily care of your teeth and gums. Brush your teeth every morning and night with fluoride  toothpaste. Floss one time each day. Stay active. Exercise for at least 30 minutes 5 or more days each week. Do not use any products that contain nicotine or tobacco, such as cigarettes, e-cigarettes, and chewing tobacco. If you need help quitting, ask your health care  provider. Do not use drugs. If you are sexually active, practice safe sex. Use a condom or other form of protection to prevent STIs (sexually transmitted infections). Talk with your health care provider about taking a low-dose aspirin or statin. Find healthy ways to cope with stress, such as: Meditation, yoga, or listening to music. Journaling. Talking to a trusted person. Spending time with friends and family. Safety Always wear your seat belt while driving or riding in a vehicle. Do not drive: If you have been drinking alcohol. Do not ride with someone who has been drinking. When you are tired or distracted. While texting. Wear a helmet and other protective equipment during sports activities. If you have firearms in your house, make sure you follow all gun safety procedures. What's next? Visit your health care provider once a year for an annual wellness visit. Ask your health care provider how often you should have your eyes and teeth checked. Stay up to date on all vaccines. This information is not intended to replace advice given to you by your health care provider. Make sure you discuss any questions you have with your healthcare provider. Document Revised: 06/19/2019 Document Reviewed: 09/14/2018 Elsevier Patient Education  2022 [employer].

## 2021-04-08 NOTE — Assessment & Plan Note (Signed)
Overall well controlled on PRN albuterol. Infrequent use except seasonally.   Lungs clear. Due for lung cancer screening later this month.

## 2021-05-18 ENCOUNTER — Other Ambulatory Visit: Payer: Self-pay | Admitting: *Deleted

## 2021-05-18 DIAGNOSIS — Z87891 Personal history of nicotine dependence: Secondary | ICD-10-CM

## 2021-06-02 ENCOUNTER — Ambulatory Visit
Admission: RE | Admit: 2021-06-02 | Discharge: 2021-06-02 | Disposition: A | Discharge status: Home / Self Care | Payer: Medicare Other | Source: Ambulatory Visit | Attending: Acute Care | Admitting: Acute Care

## 2021-06-02 ENCOUNTER — Other Ambulatory Visit: Payer: Self-pay

## 2021-06-02 DIAGNOSIS — Z87891 Personal history of nicotine dependence: Secondary | ICD-10-CM | POA: Diagnosis not present

## 2021-06-11 NOTE — Progress Notes (Signed)
Please call patient and let them  know their  low dose Ct was read as a Lung RADS 2: nodules that are benign in appearance and behavior with a very low likelihood of becoming a clinically active cancer due to size or lack of growth. Recommendation per radiology is for a repeat LDCT in 12 months. .Please let them  know we will order and schedule their  annual screening scan for 06/2022. Please let them  know there was notation of CAD on their  scan.  Please remind the patient  that this is a non-gated exam therefore degree or severity of disease  cannot be determined. Please have them  follow up with their PCP regarding potential risk factor modification, dietary therapy or pharmacologic therapy if clinically indicated. Pt.  is  currently on statin therapy. Please place order for annual  screening scan for  06/2022 and fax results to PCP. Thanks so much.  + Mild atherosclerotic calcification is noted in the wall of the thoracic aorta. On statin, No echo. Have patient follow up with PCP. Thanks so much

## 2021-06-12 ENCOUNTER — Encounter: Payer: Self-pay | Admitting: *Deleted

## 2021-06-12 DIAGNOSIS — Z87891 Personal history of nicotine dependence: Secondary | ICD-10-CM

## 2021-06-12 DIAGNOSIS — F17210 Nicotine dependence, cigarettes, uncomplicated: Secondary | ICD-10-CM

## 2021-06-23 ENCOUNTER — Other Ambulatory Visit: Payer: Self-pay | Admitting: Primary Care

## 2021-06-23 ENCOUNTER — Other Ambulatory Visit: Payer: Self-pay

## 2021-06-23 ENCOUNTER — Other Ambulatory Visit (INDEPENDENT_AMBULATORY_CARE_PROVIDER_SITE_OTHER): Payer: Medicare Other

## 2021-06-23 DIAGNOSIS — E782 Mixed hyperlipidemia: Secondary | ICD-10-CM | POA: Diagnosis not present

## 2021-06-23 DIAGNOSIS — N529 Male erectile dysfunction, unspecified: Secondary | ICD-10-CM

## 2021-06-23 DIAGNOSIS — I700 Atherosclerosis of aorta: Secondary | ICD-10-CM

## 2021-06-23 LAB — LIPID PANEL
Cholesterol: 147 mg/dL (ref 0–200)
HDL: 54.9 mg/dL (ref >39.00)
LDL Cholesterol: 80 mg/dL (ref 0–99)
NonHDL: 92.35
Total CHOL/HDL Ratio: 3
Triglycerides: 63 mg/dL (ref 0.0–149.0)
VLDL: 12.6 mg/dL (ref 0.0–40.0)

## 2021-08-06 DIAGNOSIS — H524 Presbyopia: Secondary | ICD-10-CM | POA: Diagnosis not present

## 2021-08-06 DIAGNOSIS — H52223 Regular astigmatism, bilateral: Secondary | ICD-10-CM | POA: Diagnosis not present

## 2021-08-06 DIAGNOSIS — H5203 Hypermetropia, bilateral: Secondary | ICD-10-CM | POA: Diagnosis not present

## 2021-09-04 DIAGNOSIS — H524 Presbyopia: Secondary | ICD-10-CM | POA: Diagnosis not present

## 2021-11-10 NOTE — Progress Notes (Signed)
Subjective:   Don Christian is a 66 y.o. male who presents for Medicare Annual/Subsequent preventive examination.  I connected with Marcelle Smiling today by telephone and verified that I am speaking with the correct person using two identifiers. Location patient: home Location provider: work Persons participating in the virtual visit: patient, Marine scientist.    I discussed the limitations, risks, security and privacy concerns of performing an evaluation and management service by telephone and the availability of in person appointments. I also discussed with the patient that there may be a patient responsible charge related to this service. The patient expressed understanding and verbally consented to this telephonic visit.    Interactive audio and video telecommunications were attempted between this provider and patient, however failed, due to patient having technical difficulties OR patient did not have access to video capability.  We continued and completed visit with audio only.  Some vital signs may be absent or patient reported.   Time Spent with patient on telephone encounter: 25 minutes  Review of Systems     Cardiac Risk Factors include: advanced age (>56men, >70 women);dyslipidemia     Objective:    Today's Vitals   11/13/21 1037  Weight: 142 lb (64.4 kg)  Height: 6' (1.829 m)   Body mass index is 19.26 kg/m.  Advanced Directives 11/13/2021 11/12/2020 03/30/2019 03/27/2018 03/18/2017 01/18/2017 05/09/2015  Does Patient Have a Medical Advance Directive? No No No No No No Yes  Type of Advance Directive - - - - - - Press photographer;Living will  Would patient like information on creating a medical advance directive? Yes (MAU/Ambulatory/Procedural Areas - Information given) No - Patient declined No - Patient declined Yes (MAU/Ambulatory/Procedural Areas - Information given) - - -    Current Medications (verified) Outpatient Encounter Medications as of 11/13/2021  Medication  Sig   albuterol (VENTOLIN HFA) 108 (90 Base) MCG/ACT inhaler Inhale 2 puffs into the lungs every 6 (six) hours as needed for wheezing or shortness of breath.   buPROPion (ZYBAN) 150 MG 12 hr tablet Take 1 tablet (150 mg total) by mouth daily.   pravastatin (PRAVACHOL) 40 MG tablet Take 1 tablet (40 mg total) by mouth daily. For cholesterol.   tadalafil (CIALIS) 10 MG tablet TAKE 1 TABLET BY MOUTH 60 MINUTES PRIOR TO INTERCOURSE AS NEEDED.   traZODone (DESYREL) 100 MG tablet Take 1 tablet (100 mg total) by mouth at bedtime as needed for sleep.   No facility-administered encounter medications on file as of 11/13/2021.    Allergies (verified) Patient has no known allergies.   History: Past Medical History:  Diagnosis Date   BPH (benign prostatic hyperplasia) 09/16/2014   Cataract    left eye   Chronic back pain 09/16/2014   Colon polyps    COPD (chronic obstructive pulmonary disease) (HCC)    mild   DDD (degenerative disc disease), lumbar 09/16/2014   Full dentures    Inguinal hernia 2004   Insomnia 09/16/2014   Personal history of tobacco use, presenting hazards to health 03/10/2016   Prostate disorder    S/P lumbar spinal fusion 05/09/2015   Sleeping difficulties    Tobacco use disorder 09/16/2014   Varicella zoster 11/19/2015   Past Surgical History:  Procedure Laterality Date   ABDOMINAL EXPOSURE N/A 05/09/2015   Procedure: ABDOMINAL EXPOSURE;  Surgeon: Rosetta Posner, MD;  Location: [facility];  Service: Vascular;  Laterality: N/A;   ANTERIOR LUMBAR FUSION N/A 05/09/2015   Procedure: LUMBAR FOUR-FIVE ANTERIOR LUMBAR FUSION  WITH ABDOMINAL EXPOSURE;  Surgeon: Eustace Moore, MD;  Location: [facility];  Service: Neurosurgery;  Laterality: N/A;   COLONOSCOPY W/ BIOPSIES AND POLYPECTOMY     COLONOSCOPY WITH PROPOFOL N/A 03/18/2017   Procedure: COLONOSCOPY WITH PROPOFOL;  Surgeon: [name], MD;  Location: [facility] ENDOSCOPY;  Service: Endoscopy;  Laterality: N/A;   FOOT SURGERY Right 2021    GANGLION CYST EXCISION     left leg   HERNIA REPAIR Left    inguinal at 8    TOOTH EXTRACTION     Family History  Problem Relation Age of Onset   Cancer Mother        Lung   Heart disease Mother    Cancer Father        Lung with mets to brain   Cancer Sister        Lung   Social History   Socioeconomic History   Marital status: Married    Spouse name: Not on file   Number of children: Not on file   Years of education: Not on file   Highest education level: Not on file  Occupational History   Not on file  Tobacco Use   Smoking status: Former    Types: Cigars    Quit date: 11/04/2018    Years since quitting: 3.0   Smokeless tobacco: Never  Vaping Use   Vaping Use: Never used  Substance and Sexual Activity   Alcohol use: Not Currently    Comment: occassionally on social occassions   Drug use: No   Sexual activity: Yes  Other Topics Concern   Not on file  Social History Narrative   ** Merged History Encounter **       Work or School: no work currently, disabled from back injury in 2012 Home Situation: lives with wife Spiritual Beliefs: none Lifestyle: walks dog 2x per day;  Healthy diet per his report. Enjoys spending time outdoors.      Social Determinants of Health   Financial Resource Strain: Low Risk    Difficulty of Paying Living Expenses: Not hard at all  Food Insecurity: No Food Insecurity   Worried About Charity fundraiser in the Last Year: Never true   [place] in the Last Year: Never true  Transportation Needs: No Transportation Needs   Lack of Transportation (Medical): No   Lack of Transportation (Non-Medical): No  Physical Activity: Sufficiently Active   Days of Exercise per Week: 7 days   Minutes of Exercise per Session: 40 min  Stress: No Stress Concern Present   Feeling of Stress : Only a little  Social Connections: Moderately Isolated   Frequency of Communication with Friends and Family: More than three times a week    Frequency of Social Gatherings with Friends and Family: More than three times a week   Attends Religious Services: Never   Marine scientist or Organizations: No   Attends Music therapist: Never   Marital Status: Married    Tobacco Counseling Counseling given: Not Answered   Clinical Intake:  Pre-visit preparation completed: Yes  Pain : No/denies pain     BMI - recorded: 19.26 Nutritional Status: BMI of 19-24  Normal Nutritional Risks: None Diabetes: No  How often do you need to have someone help you when you read instructions, pamphlets, or other written materials from your doctor or pharmacy?: 1 - Never  Diabetic? No  Interpreter Needed?: No   Information entered by ::  Orrin Brigham LPN   Activities of Daily Living In your present state of health, do you have any difficulty performing the following activities: 11/13/2021 04/08/2021  Hearing? Y N  Vision? Y N  Difficulty concentrating or making decisions? N N  Walking or climbing stairs? N N  Dressing or bathing? N N  Doing errands, shopping? N N  Preparing Food and eating ? N -  Using the Toilet? N -  In the past six months, have you accidently leaked urine? N -  Do you have problems with loss of bowel control? N -  Managing your Medications? N -  Managing your Finances? N -  Housekeeping or managing your Housekeeping? N -  Some recent data might be hidden    Patient Care Team: Pleas Koch, NP as PCP - General (Internal Medicine) Pleas Koch, NP (Nurse Practitioner) Robert Bellow, MD (General Surgery)  Indicate any recent Medical Services you may have received from other than [facility] providers in the past year (date may be approximate).     Assessment:   This is a routine wellness examination for Kelly.  Hearing/Vision screen Hearing Screening - Comments:: Decrease in hearing Vision Screening - Comments:: Last exam 10/2021, [facility]  Dietary issues and  exercise activities discussed: Current Exercise Habits: Home exercise routine, Type of exercise: walking, Time (Minutes): 45, Frequency (Times/Week): 7, Weekly Exercise (Minutes/Week): 315, Intensity: Intense   Goals Addressed             This Visit's Progress    Patient Stated       Would like to exercise more       Depression Screen PHQ 2/9 Scores 11/13/2021 04/08/2021 11/12/2020 03/30/2019 03/27/2018 01/18/2017 01/27/2016  PHQ - 2 Score 0 0 0 0 0 0 4  PHQ- 9 Score - 0 0 0 0 - 10    Fall Risk Fall Risk  11/13/2021 04/08/2021 11/12/2020 03/30/2019 03/27/2018  Falls in the past year? 0 0 0 0 No  Number falls in past yr: 0 0 0 - -  Injury with Fall? 0 0 0 - -  Risk for fall due to : No Fall Risks - No Fall Risks - -  Follow up Falls prevention discussed - Falls evaluation completed;Falls prevention discussed - -    FALL RISK PREVENTION PERTAINING TO THE HOME:  Any stairs in or around the home? Yes  If so, are there any without handrails? No  Home free of loose throw rugs in walkways, pet beds, electrical cords, etc? Yes  Adequate lighting in your home to reduce risk of falls? Yes   ASSISTIVE DEVICES UTILIZED TO PREVENT FALLS:  Life alert? No  Use of a cane, walker or w/c? No  Grab bars in the bathroom? No  Shower chair or bench in shower? No  Elevated toilet seat or a handicapped toilet? No   TIMED UP AND GO:  Was the test performed? No .    Cognitive Function: Normal cognitive status assessed by this Nurse Health Advisor. No abnormalities found.   MMSE - Mini Mental State Exam 11/12/2020 03/30/2019 03/27/2018 01/18/2017  Not completed: Refused - - -  Orientation to time - 5 5 5   Orientation to Place - 5 5 5   Registration - 3 3 3   Attention/ Calculation - 0 0 0  Recall - 3 3 3   Language- name 2 objects - 0 0 0  Language- repeat - 1 1 1   Language- follow 3 step command -  0 3 3  Language- read & follow direction - 0 0 0  Write a sentence - 0 0 0  Copy design - 0 0 0  Total  score - 17 20 20         Immunizations Immunization History  Administered Date(s) Administered   Influenza Inj Mdck Quad Pf 07/27/2018   Influenza,inj,Quad PF,6+ Mos 09/16/2014, 08/11/2015, 07/12/2017, 06/19/2019   Influenza-Unspecified 07/04/2020, 07/14/2021   PFIZER(Purple Top)SARS-COV-2 Vaccination 01/03/2020, 01/28/2020, 08/02/2020, 02/12/2021   Pfizer Covid-19 Vaccine Bivalent Booster 5yrs & up 07/14/2021   Pneumococcal Conjugate-13 04/04/2020   Pneumococcal Polysaccharide-23 04/18/2018   Zoster Recombinat (Shingrix) 09/03/2016, 01/29/2017    TDAP status: Due, Education has been provided regarding the importance of this vaccine. Advised may receive this vaccine at local pharmacy or Health Dept. Aware to provide a copy of the vaccination record if obtained from local pharmacy or Health Dept. Verbalized acceptance and understanding.  Flu Vaccine status: Up to date  Pneumococcal vaccine status: Up to date  Covid-19 vaccine status: Completed vaccines  Qualifies for Shingles Vaccine? Yes   Zostavax completed No   Shingrix Completed?: Yes  Screening Tests Health Maintenance  Topic Date Due   TETANUS/TDAP  11/12/2025 (Originally 10/05/2019)   Pneumonia Vaccine 10+ Years old (66 - PPSV23 if available, else PCV20) 04/19/2023   COLONOSCOPY (Pts 45-73yrs Insurance coverage will need to be confirmed)  03/19/2027   INFLUENZA VACCINE  Completed   COVID-19 Vaccine  Completed   Hepatitis C Screening  Completed   Zoster Vaccines- Shingrix  Completed   HPV VACCINES  Aged Out    Health Maintenance  There are no preventive care reminders to display for this patient.   Colorectal cancer screening: Type of screening: Colonoscopy. Completed 03/18/17. Repeat every 10 years  Lung Cancer Screening: (Low Dose CT Chest recommended if Age 71-80 years, 30 pack-year currently smoking OR have quit w/in 15years.) does qualify. Lung CT scan completed  06/02/21    Additional  Screening:  Hepatitis C Screening: does qualify; Completed 01/22/16  Vision Screening: Recommended annual ophthalmology exams for early detection of glaucoma and other disorders of the eye. Is the patient up to date with their annual eye exam?  Yes  Who is the provider or what is the name of the office in which the patient attends annual eye exams? [facility]   Dental Screening: Recommended annual dental exams for proper oral hygiene  Community Resource Referral / Chronic Care Management: CRR required this visit?  No   CCM required this visit?  No      Plan:     I have personally reviewed and noted the following in the patients chart:   Medical and social history Use of alcohol, tobacco or illicit drugs  Current medications and supplements including opioid prescriptions. Patient is currently taking opioid prescriptions. Information provided to patient regarding non-opioid alternatives. Patient advised to discuss non-opioid treatment plan with their provider. Functional ability and status Nutritional status Physical activity Advanced directives List of other physicians Hospitalizations, surgeries, and ER visits in previous 12 months Vitals Screenings to include cognitive, depression, and falls Referrals and appointments  In addition, I have reviewed and discussed with patient certain preventive protocols, quality metrics, and best practice recommendations. A written personalized care plan for preventive services as well as general preventive health recommendations were provided to patient.   Due to this being a telephonic visit, the after visit summary with patients personalized plan was offered to patient via mail or my-chart.  Patient would like to access on my-chart.   Loma Messing, LPN   [id number]   Nurse Health Advisor  Nurse Notes: none

## 2021-11-13 ENCOUNTER — Ambulatory Visit (INDEPENDENT_AMBULATORY_CARE_PROVIDER_SITE_OTHER): Payer: No Typology Code available for payment source

## 2021-11-13 VITALS — Ht 72.0 in | Wt 142.0 lb

## 2021-11-13 DIAGNOSIS — Z0000 Encounter for general adult medical examination without abnormal findings: Secondary | ICD-10-CM | POA: Diagnosis not present

## 2021-11-13 NOTE — Patient Instructions (Signed)
Don Christian , Thank you for taking time to complete your Medicare Wellness Visit. I appreciate your ongoing commitment to your health goals. Please review the following plan we discussed and let me know if I can assist you in the future.   Screening recommendations/referrals: Colonoscopy: up to date, 03/18/17 Recommended yearly ophthalmology/optometry visit for glaucoma screening and checkup Recommended yearly dental visit for hygiene and checkup  Vaccinations: Influenza vaccine: up to date Pneumococcal vaccine: up to date  Tdap vaccine: Due-May obtain vaccine at your local pharmacy. Shingles vaccine: up to date    Covid-19: up to date   Advanced directives: Please bring a copy of Living Will and/or [place] for your chart, when available    Conditions/risks identified: see problem list   Next appointment: Follow up in one year for your annual wellness visit. 11/16/22 @ 8:15am, this will be a telephone visit   Preventive Care 67 Years and Older, Male Preventive care refers to lifestyle choices and visits with your health care provider that can promote health and wellness. What does preventive care include? A yearly physical exam. This is also called an annual well check. Dental exams once or twice a year. Routine eye exams. Ask your health care provider how often you should have your eyes checked. Personal lifestyle choices, including: Daily care of your teeth and gums. Regular physical activity. Eating a healthy diet. Avoiding tobacco and drug use. Limiting alcohol use. Practicing safe sex. Taking low doses of aspirin every day. Taking vitamin and mineral supplements as recommended by your health care provider. What happens during an annual well check? The services and screenings done by your health care provider during your annual well check will depend on your age, overall health, lifestyle risk factors, and family history of disease. Counseling  Your  health care provider may ask you questions about your: Alcohol use. Tobacco use. Drug use. Emotional well-being. Home and relationship well-being. Sexual activity. Eating habits. History of falls. Memory and ability to understand (cognition). Work and work Statistician. Screening  You may have the following tests or measurements: Height, weight, and BMI. Blood pressure. Lipid and cholesterol levels. These may be checked every 5 years, or more frequently if you are over 67 years old. Skin check. Lung cancer screening. You may have this screening every year starting at age 67 if you have a 30-pack-year history of smoking and currently smoke or have quit within the past 15 years. Fecal occult blood test (FOBT) of the stool. You may have this test every year starting at age 67. Flexible sigmoidoscopy or colonoscopy. You may have a sigmoidoscopy every 5 years or a colonoscopy every 10 years starting at age 67. Prostate cancer screening. Recommendations will vary depending on your family history and other risks. Hepatitis C blood test. Hepatitis B blood test. Sexually transmitted disease (STD) testing. Diabetes screening. This is done by checking your blood sugar (glucose) after you have not eaten for a while (fasting). You may have this done every 1-3 years. Abdominal aortic aneurysm (AAA) screening. You may need this if you are a current or former smoker. Osteoporosis. You may be screened starting at age 67 if you are at high risk. Talk with your health care provider about your test results, treatment options, and if necessary, the need for more tests. Vaccines  Your health care provider may recommend certain vaccines, such as: Influenza vaccine. This is recommended every year. Tetanus, diphtheria, and acellular pertussis (Tdap, Td) vaccine. You may need a  Td booster every 10 years. Zoster vaccine. You may need this after age 47. Pneumococcal 13-valent conjugate (PCV13) vaccine. One dose  is recommended after age 91. Pneumococcal polysaccharide (PPSV23) vaccine. One dose is recommended after age 84. Talk to your health care provider about which screenings and vaccines you need and how often you need them. This information is not intended to replace advice given to you by your health care provider. Make sure you discuss any questions you have with your health care provider. Document Released: 10/17/2015 Document Revised: 06/09/2016 Document Reviewed: 07/22/2015 Elsevier Interactive Patient Education  2017 [place] Prevention in the Home Falls can cause injuries. They can happen to people of all ages. There are many things you can do to make your home safe and to help prevent falls. What can I do on the outside of my home? Regularly fix the edges of walkways and driveways and fix any cracks. Remove anything that might make you trip as you walk through a door, such as a raised step or threshold. Trim any bushes or trees on the path to your home. Use bright outdoor lighting. Clear any walking paths of anything that might make someone trip, such as rocks or tools. Regularly check to see if handrails are loose or broken. Make sure that both sides of any steps have handrails. Any raised decks and porches should have guardrails on the edges. Have any leaves, snow, or ice cleared regularly. Use sand or salt on walking paths during winter. Clean up any spills in your garage right away. This includes oil or grease spills. What can I do in the bathroom? Use night lights. Install grab bars by the toilet and in the tub and shower. Do not use towel bars as grab bars. Use non-skid mats or decals in the tub or shower. If you need to sit down in the shower, use a plastic, non-slip stool. Keep the floor dry. Clean up any water that spills on the floor as soon as it happens. Remove soap buildup in the tub or shower regularly. Attach bath mats securely with double-sided non-slip rug  tape. Do not have throw rugs and other things on the floor that can make you trip. What can I do in the bedroom? Use night lights. Make sure that you have a light by your bed that is easy to reach. Do not use any sheets or blankets that are too big for your bed. They should not hang down onto the floor. Have a firm chair that has side arms. You can use this for support while you get dressed. Do not have throw rugs and other things on the floor that can make you trip. What can I do in the kitchen? Clean up any spills right away. Avoid walking on wet floors. Keep items that you use a lot in easy-to-reach places. If you need to reach something above you, use a strong step stool that has a grab bar. Keep electrical cords out of the way. Do not use floor polish or wax that makes floors slippery. If you must use wax, use non-skid floor wax. Do not have throw rugs and other things on the floor that can make you trip. What can I do with my stairs? Do not leave any items on the stairs. Make sure that there are handrails on both sides of the stairs and use them. Fix handrails that are broken or loose. Make sure that handrails are as long as the stairways. Check any  carpeting to make sure that it is firmly attached to the stairs. Fix any carpet that is loose or worn. Avoid having throw rugs at the top or bottom of the stairs. If you do have throw rugs, attach them to the floor with carpet tape. Make sure that you have a light switch at the top of the stairs and the bottom of the stairs. If you do not have them, ask someone to add them for you. What else can I do to help prevent falls? Wear shoes that: Do not have high heels. Have rubber bottoms. Are comfortable and fit you well. Are closed at the toe. Do not wear sandals. If you use a stepladder: Make sure that it is fully opened. Do not climb a closed stepladder. Make sure that both sides of the stepladder are locked into place. Ask someone to  hold it for you, if possible. Clearly mark and make sure that you can see: Any grab bars or handrails. First and last steps. Where the edge of each step is. Use tools that help you move around (mobility aids) if they are needed. These include: Canes. Walkers. Scooters. Crutches. Turn on the lights when you go into a dark area. Replace any light bulbs as soon as they burn out. Set up your furniture so you have a clear path. Avoid moving your furniture around. If any of your floors are uneven, fix them. If there are any pets around you, be aware of where they are. Review your medicines with your doctor. Some medicines can make you feel dizzy. This can increase your chance of falling. Ask your doctor what other things that you can do to help prevent falls. This information is not intended to replace advice given to you by your health care provider. Make sure you discuss any questions you have with your health care provider. Document Released: 07/17/2009 Document Revised: 02/26/2016 Document Reviewed: 10/25/2014 Elsevier Interactive Patient Education  2017 [employer].

## 2022-01-09 ENCOUNTER — Other Ambulatory Visit: Payer: Self-pay | Admitting: Primary Care

## 2022-01-09 DIAGNOSIS — N529 Male erectile dysfunction, unspecified: Secondary | ICD-10-CM

## 2022-03-19 ENCOUNTER — Other Ambulatory Visit: Payer: No Typology Code available for payment source

## 2022-03-31 ENCOUNTER — Telehealth: Payer: Self-pay

## 2022-03-31 DIAGNOSIS — E782 Mixed hyperlipidemia: Secondary | ICD-10-CM

## 2022-03-31 DIAGNOSIS — I700 Atherosclerosis of aorta: Secondary | ICD-10-CM

## 2022-03-31 MED ORDER — PRAVASTATIN SODIUM 40 MG PO TABS: 40.0000 mg | ORAL_TABLET | Freq: Every day | ORAL | 0 refills | Status: DC

## 2022-03-31 NOTE — Telephone Encounter (Signed)
MEDICATION: pravastatin (PRAVACHOL) 40 MG tablet  PHARMACY: [facility] [id number] - [address], [address]  Comments:   **Let patient know to contact pharmacy at the end of the day to make sure medication is ready. **  ** Please notify patient to allow 48-72 hours to process**  **Encourage patient to contact the pharmacy for refills or they can request refills through [facility]**

## 2022-03-31 NOTE — Telephone Encounter (Signed)
Refills sent to pharmacy. 

## 2022-04-01 ENCOUNTER — Other Ambulatory Visit: Payer: Self-pay | Admitting: Primary Care

## 2022-04-01 DIAGNOSIS — E782 Mixed hyperlipidemia: Secondary | ICD-10-CM

## 2022-04-01 DIAGNOSIS — I700 Atherosclerosis of aorta: Secondary | ICD-10-CM

## 2022-04-15 ENCOUNTER — Other Ambulatory Visit: Payer: No Typology Code available for payment source

## 2022-04-16 ENCOUNTER — Other Ambulatory Visit: Payer: Self-pay | Admitting: Primary Care

## 2022-04-16 DIAGNOSIS — G4700 Insomnia, unspecified: Secondary | ICD-10-CM

## 2022-04-22 ENCOUNTER — Ambulatory Visit (INDEPENDENT_AMBULATORY_CARE_PROVIDER_SITE_OTHER): Payer: No Typology Code available for payment source | Admitting: Primary Care

## 2022-04-22 ENCOUNTER — Encounter: Payer: Self-pay | Admitting: Primary Care

## 2022-04-22 VITALS — BP 110/72 | HR 68 | Temp 98.6°F | Ht 72.0 in | Wt 135.0 lb

## 2022-04-22 DIAGNOSIS — F32A Depression, unspecified: Secondary | ICD-10-CM

## 2022-04-22 DIAGNOSIS — M5136 Other intervertebral disc degeneration, lumbar region: Secondary | ICD-10-CM | POA: Diagnosis not present

## 2022-04-22 DIAGNOSIS — Z125 Encounter for screening for malignant neoplasm of prostate: Secondary | ICD-10-CM | POA: Diagnosis not present

## 2022-04-22 DIAGNOSIS — F17200 Nicotine dependence, unspecified, uncomplicated: Secondary | ICD-10-CM | POA: Diagnosis not present

## 2022-04-22 DIAGNOSIS — R7303 Prediabetes: Secondary | ICD-10-CM | POA: Diagnosis not present

## 2022-04-22 DIAGNOSIS — Z0001 Encounter for general adult medical examination with abnormal findings: Secondary | ICD-10-CM

## 2022-04-22 DIAGNOSIS — G4700 Insomnia, unspecified: Secondary | ICD-10-CM | POA: Diagnosis not present

## 2022-04-22 DIAGNOSIS — J439 Emphysema, unspecified: Secondary | ICD-10-CM | POA: Diagnosis not present

## 2022-04-22 DIAGNOSIS — F419 Anxiety disorder, unspecified: Secondary | ICD-10-CM | POA: Diagnosis not present

## 2022-04-22 DIAGNOSIS — R1032 Left lower quadrant pain: Secondary | ICD-10-CM | POA: Diagnosis not present

## 2022-04-22 DIAGNOSIS — N529 Male erectile dysfunction, unspecified: Secondary | ICD-10-CM | POA: Diagnosis not present

## 2022-04-22 DIAGNOSIS — I700 Atherosclerosis of aorta: Secondary | ICD-10-CM

## 2022-04-22 DIAGNOSIS — E782 Mixed hyperlipidemia: Secondary | ICD-10-CM

## 2022-04-22 DIAGNOSIS — M51369 Other intervertebral disc degeneration, lumbar region without mention of lumbar back pain or lower extremity pain: Secondary | ICD-10-CM

## 2022-04-22 LAB — COMPREHENSIVE METABOLIC PANEL
ALT: 12 U/L (ref 0–53)
AST: 20 U/L (ref 0–37)
Albumin: 4.4 g/dL (ref 3.5–5.2)
Alkaline Phosphatase: 61 U/L (ref 39–117)
BUN: 12 mg/dL (ref 6–23)
CO2: 27 mEq/L (ref 19–32)
Calcium: 9.3 mg/dL (ref 8.4–10.5)
Chloride: 98 mEq/L (ref 96–112)
Creatinine, Ser: 0.84 mg/dL (ref 0.40–1.50)
GFR: 90.45 mL/min (ref >60.00)
Glucose, Bld: 83 mg/dL (ref 70–99)
Potassium: 4.1 mEq/L (ref 3.5–5.1)
Sodium: 134 mEq/L — ABNORMAL LOW (ref 135–145)
Total Bilirubin: 0.7 mg/dL (ref 0.2–1.2)
Total Protein: 6.6 g/dL (ref 6.0–8.3)

## 2022-04-22 LAB — LIPID PANEL
Cholesterol: 162 mg/dL (ref 0–200)
HDL: 61.2 mg/dL (ref >39.00)
LDL Cholesterol: 91 mg/dL (ref 0–99)
NonHDL: 100.91
Total CHOL/HDL Ratio: 3
Triglycerides: 49 mg/dL (ref 0.0–149.0)
VLDL: 9.8 mg/dL (ref 0.0–40.0)

## 2022-04-22 LAB — CBC
HCT: 41.4 % (ref 39.0–52.0)
Hemoglobin: 13.7 g/dL (ref 13.0–17.0)
MCHC: 33.2 g/dL (ref 30.0–36.0)
MCV: 91.3 fl (ref 78.0–100.0)
Platelets: 175 10*3/uL (ref 150.0–400.0)
RBC: 4.53 Mil/uL (ref 4.22–5.81)
RDW: 13.6 % (ref 11.5–15.5)
WBC: 5.6 10*3/uL (ref 4.0–10.5)

## 2022-04-22 LAB — HEMOGLOBIN A1C: Hgb A1c MFr Bld: 5.7 % (ref 4.6–6.5)

## 2022-04-22 LAB — PSA, MEDICARE: PSA: 2.61 ng/ml (ref 0.10–4.00)

## 2022-04-22 MED ORDER — BUDESONIDE-FORMOTEROL FUMARATE 160-4.5 MCG/ACT IN AERO: 2.0000 | INHALATION_SPRAY | Freq: Two times a day (BID) | RESPIRATORY_TRACT | 5 refills | Status: DC

## 2022-04-22 MED ORDER — DULOXETINE HCL 30 MG PO CPEP: 30.0000 mg | ORAL_CAPSULE | Freq: Every day | ORAL | 0 refills | Status: DC

## 2022-04-22 MED ORDER — ALBUTEROL SULFATE HFA 108 (90 BASE) MCG/ACT IN AERS: 2.0000 | INHALATION_SPRAY | Freq: Four times a day (QID) | RESPIRATORY_TRACT | 0 refills | Status: DC | PRN

## 2022-04-22 NOTE — Assessment & Plan Note (Signed)
Discussed the importance of a healthy diet and regular exercise in order for weight loss, and to reduce the risk of further co-morbidity.  Repeat A1c pending. 

## 2022-04-22 NOTE — Assessment & Plan Note (Signed)
Controlled.  Continue tadalafil 10 mg as needed.

## 2022-04-22 NOTE — Assessment & Plan Note (Signed)
Overall controlled.  Continue trazodone 100 mg at bedtime.

## 2022-04-22 NOTE — Assessment & Plan Note (Signed)
Repeat lipid panel pending.  Continue pravastatin 40 mg daily. Consider reducing pravastatin frequency to 3 days weekly given myalgias.

## 2022-04-22 NOTE — Assessment & Plan Note (Signed)
Continued cramping on pravastatin, however he does have aortic atherosclerosis.  Continue pravastatin 40 mg for now. Repeat lipid panel pending.

## 2022-04-22 NOTE — Assessment & Plan Note (Signed)
Overall doing well.  Continue bupropion SR 150 mg once daily. Continue annual lung cancer screening evaluation.

## 2022-04-22 NOTE — Patient Instructions (Addendum)
Start budesonide-formoterol (Symbicort) inhaler for shortness of breath/COPD.  Inhale 2 puffs into the lungs twice daily every day.  Use the albuterol inhaler as needed.  Start duloxetine (Cymbalta) 30 mg capsules once daily for anxiety and depression.  This can also help with chronic pain.  Stop by the lab prior to leaving today. I will notify you of your results once received.   You will be contacted regarding your referral to general surgery.  Please let us know if you have not been contacted within two weeks.   Please schedule a follow up visit for 6 weeks for follow up of anxiety/depression/pain.  It was a pleasure to see you today!   Preventive Care 20 Years and Older, Male Preventive care refers to lifestyle choices and visits with your health care provider that can promote health and wellness. Preventive care visits are also called wellness exams. What can I expect for my preventive care visit? Counseling During your preventive care visit, your health care provider may ask about your: Medical history, including: Past medical problems. Family medical history. History of falls. Current health, including: Emotional well-being. Home life and relationship well-being. Sexual activity. Memory and ability to understand (cognition). Lifestyle, including: Alcohol, nicotine or tobacco, and drug use. Access to firearms. Diet, exercise, and sleep habits. Work and work Statistician. Sunscreen use. Safety issues such as seatbelt and bike helmet use. Physical exam Your health care provider will check your: Height and weight. These may be used to calculate your BMI (body mass index). BMI is a measurement that tells if you are at a healthy weight. Waist circumference. This measures the distance around your waistline. This measurement also tells if you are at a healthy weight and may help predict your risk of certain diseases, such as type 2 diabetes and high blood pressure. Heart rate  and blood pressure. Body temperature. Skin for abnormal spots. What immunizations do I need?  Vaccines are usually given at various ages, according to a schedule. Your health care provider will recommend vaccines for you based on your age, medical history, and lifestyle or other factors, such as travel or where you work. What tests do I need? Screening Your health care provider may recommend screening tests for certain conditions. This may include: Lipid and cholesterol levels. Diabetes screening. This is done by checking your blood sugar (glucose) after you have not eaten for a while (fasting). Hepatitis C test. Hepatitis B test. HIV (human immunodeficiency virus) test. STI (sexually transmitted infection) testing, if you are at risk. Lung cancer screening. Colorectal cancer screening. Prostate cancer screening. Abdominal aortic aneurysm (AAA) screening. You may need this if you are a current or former smoker. Talk with your health care provider about your test results, treatment options, and if necessary, the need for more tests. Follow these instructions at home: Eating and drinking  Eat a diet that includes fresh fruits and vegetables, whole grains, lean protein, and low-fat dairy products. Limit your intake of foods with high amounts of sugar, saturated fats, and salt. Take vitamin and mineral supplements as recommended by your health care provider. Do not drink alcohol if your health care provider tells you not to drink. If you drink alcohol: Limit how much you have to 0-2 drinks a day. Know how much alcohol is in your drink. In the [place] one drink equals one 12 oz bottle of beer (355 mL), one 5 oz glass of wine (148 mL), or one 1 oz glass of hard liquor (44 mL). Lifestyle Brush  your teeth every morning and night with fluoride toothpaste. Floss one time each day. Exercise for at least 30 minutes 5 or more days each week. Do not use any products that contain nicotine or tobacco.  These products include cigarettes, chewing tobacco, and vaping devices, such as e-cigarettes. If you need help quitting, ask your health care provider. Do not use drugs. If you are sexually active, practice safe sex. Use a condom or other form of protection to prevent STIs. Take aspirin only as told by your health care provider. Make sure that you understand how much to take and what form to take. Work with your health care provider to find out whether it is safe and beneficial for you to take aspirin daily. Ask your health care provider if you need to take a cholesterol-lowering medicine (statin). Find healthy ways to manage stress, such as: Meditation, yoga, or listening to music. Journaling. Talking to a trusted person. Spending time with friends and family. Safety Always wear your seat belt while driving or riding in a vehicle. Do not drive: If you have been drinking alcohol. Do not ride with someone who has been drinking. When you are tired or distracted. While texting. If you have been using any mind-altering substances or drugs. Wear a helmet and other protective equipment during sports activities. If you have firearms in your house, make sure you follow all gun safety procedures. Minimize exposure to UV radiation to reduce your risk of skin cancer. What's next? Visit your health care provider once a year for an annual wellness visit. Ask your health care provider how often you should have your eyes and teeth checked. Stay up to date on all vaccines. This information is not intended to replace advice given to you by your health care provider. Make sure you discuss any questions you have with your health care provider. Document Revised: 03/18/2021 Document Reviewed: 03/18/2021 Elsevier Patient Education  [employer].

## 2022-04-22 NOTE — Assessment & Plan Note (Signed)
Uncontrolled with inappropriate use of albuterol inhaler.  Start Symbicort 160-4.5 mcg inhaler, 2 puffs twice daily. Continue albuterol inhaler as needed.  Continue with annual lung cancer screening program.

## 2022-04-22 NOTE — Assessment & Plan Note (Signed)
Chronic and continued.  He will be starting Cymbalta 30 mg daily for symptoms of anxiety and depression, so hopefully this may help with pain.

## 2022-04-22 NOTE — Addendum Note (Signed)
Addended by: Pleas Koch on: 04/22/2022 12:36 PM   Modules accepted: Orders

## 2022-04-22 NOTE — Assessment & Plan Note (Signed)
Chronic and continued, now with changes.  Reviewed pelvic ultrasound and abdominal CT/pelvis from 2020. Referral placed to general surgery for evaluation.

## 2022-04-22 NOTE — Progress Notes (Signed)
Subjective:    Patient ID: Don Christian, male    DOB: 08-14-1956, 66 y.o.   MRN: [id number]  COPD He complains of shortness of breath. There is no cough. Associated symptoms include myalgias. Pertinent negatives include no chest pain, headaches or rhinorrhea. His past medical history is significant for COPD.    Don Christian is a very pleasant 66 y.o. male who presents today for complete physical and follow up of chronic conditions.   He would also like to discuss COPD symptoms. Currently managed on albuterol inhaler as needed, but has been using his inhaler daily due to increased dyspnea. Symptoms include exertional dyspnea that occurs with his typical yard work activities. Historically he did not experience exertional dyspnea with his yard activities, but around Fall 2022 this changed. His symptoms improved in Winter 2022/2023 as he was less active. Spring and Summer 2023 symptoms returned. He denies dyspnea with ADL's.   He continues to be bothered by his left groin pain for which has been chronic for years. He's also noticed a mass that bulges in and out intermittently. History of hernia repair surgery to left groin in the early 2000's and hernia repair surgery to right groin at age 47. He's undergone pelvic ultrasounds and CT abdomen/pelvis scans over the years, latest CT scan from 2020 did not reveal abnormality to left groin.  He would also like to discuss anxiety and depression. No prior formal diagnosis, but symptoms began a few years ago which feeling panicked, mind racing thoughts at night, feeling anxious, feeling sensitive to anything, feeling down/depressed. He gets frustrated with doing things that he can no longer do, and has noticed increased irritability. He does struggle with chronic joint pain and muscle pain. He is managed on Trazodone 100 mg HS which helps him to sleep at night. He is also managed on bupropion SR 150 mg once daily for cigarette/cigar cravings.    Immunizations: -Influenza: Completed last season  -Covid-19: 5 vaccines -Shingles: Completed Shingrix series  -Pneumonia: Prevnar 13 in 2021, pneumovax 23 in 2019  Diet: [place].  Exercise: No regular exercise.  Eye exam: Completes annually  Dental exam: Completes semi-annually   Colonoscopy: Completed in 2018, due 2028 Lung Cancer Screening: Completed in August 2022, scheduled for August 2023  PSA: Due  BP Readings from Last 3 Encounters:  04/22/22 110/72  04/08/21 118/74  04/29/20 126/76         Review of Systems  Constitutional:  Negative for unexpected weight change.  HENT:  Negative for rhinorrhea.   Respiratory:  Positive for shortness of breath. Negative for cough.        See HPI  Cardiovascular:  Negative for chest pain.  Gastrointestinal:  Negative for constipation and diarrhea.  Genitourinary:  Negative for difficulty urinating.  Musculoskeletal:  Positive for arthralgias and myalgias.  Skin:  Negative for rash.  Allergic/Immunologic: Positive for environmental allergies.  Neurological:  Negative for dizziness and headaches.  Psychiatric/Behavioral:  The patient is nervous/anxious.        See HPI         Past Medical History:  Diagnosis Date   BPH (benign prostatic hyperplasia) 09/16/2014   Cataract    left eye   Chronic back pain 09/16/2014   Colon polyps    COPD (chronic obstructive pulmonary disease) (HCC)    mild   DDD (degenerative disc disease), lumbar 09/16/2014   Full dentures    Inguinal hernia 2004   Insomnia 09/16/2014   Personal history of tobacco  use, presenting hazards to health 03/10/2016   Prostate disorder    S/P lumbar spinal fusion 05/09/2015   Sleeping difficulties    Tobacco use disorder 09/16/2014   Varicella zoster 11/19/2015    Social History   Socioeconomic History   Marital status: Married    Spouse name: Not on file   Number of children: Not on file   Years of education: Not on file   Highest education  level: Not on file  Occupational History   Not on file  Tobacco Use   Smoking status: Former    Types: Cigars    Quit date: 11/04/2018    Years since quitting: 3.4   Smokeless tobacco: Never  Vaping Use   Vaping Use: Never used  Substance and Sexual Activity   Alcohol use: Not Currently    Comment: occassionally on social occassions   Drug use: No   Sexual activity: Yes  Other Topics Concern   Not on file  Social History Narrative   ** Merged History Encounter **       Work or School: no work currently, disabled from back injury in 2012 Home Situation: lives with wife Spiritual Beliefs: none Lifestyle: walks dog 2x per day;  Healthy diet per his report. Enjoys spending time outdoors.      Social Determinants of Health   Financial Resource Strain: Low Risk  (11/13/2021)   Overall Financial Resource Strain (CARDIA)    Difficulty of Paying Living Expenses: Not hard at all  Food Insecurity: No Food Insecurity (11/13/2021)   Hunger Vital Sign    Worried About Running Out of Food in the Last Year: Never true    Ran Out of Food in the Last Year: Never true  Transportation Needs: No Transportation Needs (11/13/2021)   PRAPARE - Hydrologist (Medical): No    Lack of Transportation (Non-Medical): No  Physical Activity: Sufficiently Active (11/13/2021)   Exercise Vital Sign    Days of Exercise per Week: 7 days    Minutes of Exercise per Session: 40 min  Stress: No Stress Concern Present (11/13/2021)   [place]    Feeling of Stress : Only a little  Social Connections: Moderately Isolated (11/13/2021)   Social Connection and Isolation Panel [NHANES]    Frequency of Communication with Friends and Family: More than three times a week    Frequency of Social Gatherings with Friends and Family: More than three times a week    Attends Religious Services: Never    Marine scientist or  Organizations: No    Attends Archivist Meetings: Never    Marital Status: Married  Human resources officer Violence: Not At Risk (11/13/2021)   Humiliation, Afraid, Rape, and Kick questionnaire    Fear of Current or Ex-Partner: No    Emotionally Abused: No    Physically Abused: No    Sexually Abused: No    Past Surgical History:  Procedure Laterality Date   ABDOMINAL EXPOSURE N/A 05/09/2015   Procedure: ABDOMINAL EXPOSURE;  Surgeon: Rosetta Posner, MD;  Location: [facility];  Service: Vascular;  Laterality: N/A;   ANTERIOR LUMBAR FUSION N/A 05/09/2015   Procedure: LUMBAR FOUR-FIVE ANTERIOR LUMBAR FUSION WITH ABDOMINAL EXPOSURE;  Surgeon: Eustace Moore, MD;  Location: [facility];  Service: Neurosurgery;  Laterality: N/A;   COLONOSCOPY W/ BIOPSIES AND POLYPECTOMY     COLONOSCOPY WITH PROPOFOL N/A 03/18/2017   Procedure: COLONOSCOPY  WITH PROPOFOL;  Surgeon: [name], MD;  Location: [facility] ENDOSCOPY;  Service: Endoscopy;  Laterality: N/A;   FOOT SURGERY Right 2021   GANGLION CYST EXCISION     left leg   HERNIA REPAIR Left    inguinal at 20    TOOTH EXTRACTION      Family History  Problem Relation Age of Onset   Cancer Mother        Lung   Heart disease Mother    Cancer Father        Lung with mets to brain   Cancer Sister        Lung    No Known Allergies  Current Outpatient Medications on File Prior to Visit  Medication Sig Dispense Refill   albuterol (VENTOLIN HFA) 108 (90 Base) MCG/ACT inhaler Inhale 2 puffs into the lungs every 6 (six) hours as needed for wheezing or shortness of breath. 8 g 0   buPROPion (ZYBAN) 150 MG 12 hr tablet Take 1 tablet (150 mg total) by mouth daily. 90 tablet 3   pravastatin (PRAVACHOL) 40 MG tablet Take 1 tablet (40 mg total) by mouth daily. For cholesterol. Office visit required for further refills. 90 tablet 0   tadalafil (CIALIS) 10 MG tablet TAKE ONE TABLET BY MOUTH 60 MINUTES PRIOR TO INTERCOURSE AS NEEDED 30 tablet 0   traZODone  (DESYREL) 100 MG tablet Take 1 tablet (100 mg total) by mouth at bedtime as needed. for sleep. Office visit required for further refills. 90 tablet 0   No current facility-administered medications on file prior to visit.    BP 110/72   Pulse 68   Temp 98.6 F (37 C) (Oral)   Ht 6' (1.829 m)   Wt 135 lb (61.2 kg)   SpO2 97%   BMI 18.31 kg/m  Objective:   Physical Exam HENT:     Right Ear: Tympanic membrane and ear canal normal.     Left Ear: Tympanic membrane and ear canal normal.     Nose: Nose normal.     Right Sinus: No maxillary sinus tenderness or frontal sinus tenderness.     Left Sinus: No maxillary sinus tenderness or frontal sinus tenderness.  Eyes:     Conjunctiva/sclera: Conjunctivae normal.  Neck:     Thyroid: No thyromegaly.     Vascular: No carotid bruit.  Cardiovascular:     Rate and Rhythm: Normal rate and regular rhythm.     Heart sounds: Normal heart sounds.  Pulmonary:     Effort: Pulmonary effort is normal.     Breath sounds: Normal breath sounds. No wheezing or rales.  Abdominal:     General: Bowel sounds are normal.     Palpations: Abdomen is soft.     Tenderness: There is no abdominal tenderness.  Musculoskeletal:        General: Normal range of motion.     Cervical back: Neck supple.  Skin:    General: Skin is warm and dry.  Neurological:     Mental Status: He is alert and oriented to person, place, and time.     Cranial Nerves: No cranial nerve deficit.     Deep Tendon Reflexes: Reflexes are normal and symmetric.  Psychiatric:        Mood and Affect: Mood normal.           Assessment & Plan:   Problem List Items Addressed This Visit       Cardiovascular and Mediastinum   Aortic atherosclerosis (  [place])    Continued cramping on pravastatin, however he does have aortic atherosclerosis.  Continue pravastatin 40 mg for now. Repeat lipid panel pending.        Respiratory   Pulmonary emphysema ([place])    Uncontrolled with  inappropriate use of albuterol inhaler.  Start Symbicort 160-4.5 mcg inhaler, 2 puffs twice daily. Continue albuterol inhaler as needed.  Continue with annual lung cancer screening program.      Relevant Medications   budesonide-formoterol (SYMBICORT) 160-4.5 MCG/ACT inhaler     Musculoskeletal and Integument   DDD (degenerative disc disease), lumbar    Chronic and continued.  He will be starting Cymbalta 30 mg daily for symptoms of anxiety and depression, so hopefully this may help with pain.        Other   Tobacco use disorder    Overall doing well.  Continue bupropion SR 150 mg once daily. Continue annual lung cancer screening evaluation.      Insomnia    Overall controlled.  Continue trazodone 100 mg at bedtime.      Encounter for annual general medical examination with abnormal findings in adult - Primary    Immunizations UTD. PSA due and pending. Colonoscopy up-to-date.  Discussed the importance of a healthy diet and regular exercise in order for weight loss, and to reduce the risk of further co-morbidity.  Exam stable. Labs pending.  Follow up in 1 year for repeat physical.       Hyperlipidemia    Repeat lipid panel pending.  Continue pravastatin 40 mg daily. Consider reducing pravastatin frequency to 3 days weekly given myalgias.      Relevant Orders   Lipid panel   Comprehensive metabolic panel   CBC   Prediabetes    Discussed the importance of a healthy diet and regular exercise in order for weight loss, and to reduce the risk of further co-morbidity.  Repeat A1c pending.      Relevant Orders   Hemoglobin A1c   Erectile dysfunction    Controlled.  Continue tadalafil 10 mg as needed.      Left inguinal pain    Chronic and continued, now with changes.  Reviewed pelvic ultrasound and abdominal CT/pelvis from 2020. Referral placed to general surgery for evaluation.      Relevant Orders   Ambulatory referral to General Surgery    Other Visit Diagnoses     Anxiety and depression       Relevant Medications   DULoxetine (CYMBALTA) 30 MG capsule   Screening for prostate cancer       Relevant Orders   PSA, Medicare          Pleas Koch, NP

## 2022-04-22 NOTE — Assessment & Plan Note (Signed)
Immunizations UTD. PSA due and pending. Colonoscopy up-to-date.  Discussed the importance of a healthy diet and regular exercise in order for weight loss, and to reduce the risk of further co-morbidity.  Exam stable. Labs pending.  Follow up in 1 year for repeat physical.

## 2022-05-03 ENCOUNTER — Ambulatory Visit (INDEPENDENT_AMBULATORY_CARE_PROVIDER_SITE_OTHER): Payer: No Typology Code available for payment source | Admitting: Surgery

## 2022-05-03 ENCOUNTER — Encounter: Payer: Self-pay | Admitting: Surgery

## 2022-05-03 VITALS — BP 132/75 | HR 79 | Temp 97.7°F | Ht 72.0 in | Wt 133.6 lb

## 2022-05-03 DIAGNOSIS — R1032 Left lower quadrant pain: Secondary | ICD-10-CM

## 2022-05-03 DIAGNOSIS — R1030 Lower abdominal pain, unspecified: Secondary | ICD-10-CM

## 2022-05-03 DIAGNOSIS — G8929 Other chronic pain: Secondary | ICD-10-CM | POA: Diagnosis not present

## 2022-05-03 NOTE — Patient Instructions (Addendum)
Your CT is scheduled for 05/14/2022 at 11:30 am (arrive by 11:15 am) @ [facility] [address]. Nothing to eat or drink 4 hours prior. Please pick up contrast between now and the day before CT.    If you have any concerns or questions, please feel free to call our office.   Inguinal Hernia, Adult An inguinal hernia is when fat or your intestines push through a weak spot in a muscle where your leg meets your lower belly (groin). This causes a bulge. This kind of hernia could also be: In your scrotum, if you are male. In folds of skin around your vagina, if you are male. There are three types of inguinal hernias: Hernias that can be pushed back into the belly (are reducible). This type rarely causes pain. Hernias that cannot be pushed back into the belly (are incarcerated). Hernias that cannot be pushed back into the belly and lose their blood supply (are strangulated). This type needs emergency surgery. What are the causes? This condition is caused by having a weak spot in the muscles or tissues in your groin. This develops over time. The hernia may poke through the weak spot when you strain your lower belly muscles all of a sudden, such as when you: Lift a heavy object. Strain to poop (have a bowel movement). Trouble pooping (constipation) can lead to straining. Cough. What increases the risk? This condition is more likely to develop in: Males. Pregnant females. People who: Are overweight. Work in jobs that require long periods of standing or heavy lifting. Have had an inguinal hernia before. Smoke or have lung disease. These factors can lead to long-term (chronic) coughing. What are the signs or symptoms? Symptoms may depend on the size of the hernia. Often, a small hernia has no symptoms. Symptoms of a larger hernia may include: A bulge in the groin area. This is easier to see when standing. You might not be able to see it when you are lying down. Pain or burning in the  groin. This may get worse when you lift, strain, or cough. A dull ache or a feeling of pressure in the groin. An abnormal bulge in the scrotum, in males. Symptoms of a strangulated inguinal hernia may include: A bulge in your groin that is very painful and tender to the touch. A bulge that turns red or purple. Fever, feeling like you may vomit (nausea), and vomiting. Not being able to poop or to pass gas. How is this treated? Treatment depends on the size of your hernia and whether you have symptoms. If you do not have symptoms, your doctor may have you watch your hernia carefully and have you come in for follow-up visits. If your hernia is large or if you have symptoms, you may need surgery to repair the hernia. Follow these instructions at home: Lifestyle Avoid lifting heavy objects. Avoid standing for long amounts of time. Do not smoke or use any products that contain nicotine or tobacco. If you need help quitting, ask your doctor. Stay at a healthy weight. Prevent trouble pooping You may need to take these actions to prevent or treat trouble pooping: Drink enough fluid to keep your pee (urine) pale yellow. Take over-the-counter or prescription medicines. Eat foods that are high in fiber. These include beans, whole grains, and fresh fruits and vegetables. Limit foods that are high in fat and sugar. These include fried or sweet foods. General instructions You may try to push your hernia back in place by very  gently pressing on it when you are lying down. Do not try to push the bulge back in if it will not go in easily. Watch your hernia for any changes in shape, size, or color. Tell your doctor if you see any changes. Take over-the-counter and prescription medicines only as told by your doctor. Keep all follow-up visits. Contact a doctor if: You have a fever or chills. You have new symptoms. Your symptoms get worse. Get help right away if: You have pain in your groin that gets worse  all of a sudden. You have a bulge in your groin that: Gets bigger all of a sudden, and it does not get smaller after that. Turns red or purple. Is painful when you touch it. You are a male, and you have: Sudden pain in your scrotum. A sudden change in the size of your scrotum. You cannot push the hernia back in place by very gently pressing on it when you are lying down. You feel like you may vomit, and that feeling does not go away. You keep vomiting. You have a fast heartbeat. You cannot poop or pass gas. These symptoms may be an emergency. Get help right away. Call your local emergency services (911 in the [place] Do not wait to see if the symptoms will go away. Do not drive yourself to the hospital. Summary An inguinal hernia is when fat or your intestines push through a weak spot in a muscle where your leg meets your lower belly (groin). This causes a bulge. If you do not have symptoms, you may not need treatment. If you have symptoms or a large hernia, you may need surgery. Avoid lifting heavy objects. Also, avoid standing for long amounts of time. Do not try to push the bulge back in if it will not go in easily. This information is not intended to replace advice given to you by your health care provider. Make sure you discuss any questions you have with your health care provider. Document Revised: 05/20/2020 Document Reviewed: 05/20/2020 Elsevier Patient Education  [place].

## 2022-05-07 ENCOUNTER — Encounter: Payer: Self-pay | Admitting: Surgery

## 2022-05-07 NOTE — Progress Notes (Signed)
Patient ID: Don Christian, male   DOB: 1955-12-23, 66 y.o.   MRN: [id number]  HPI Don Christian is a 66 y.o. male seen in consultation at the request of Ms. Clark NP for left inguinal or pain.  The pain is intermittent sharp no specific alleviating or aggravating factors.  Moderate intensity. He reports a bulge within the obturator Canal on the left side  he reports that he has had prior bilateral inguinal hernia repair open as a kid and also had a recurrent left thigh with mesh several years ago. Does have a history of significant degenerative disc disease COPD.  He did have a history of lumbar fusion with an anterior approach via an abdominal extraperitoneal approach in 2016. He used to smoke heavily and quit 3 years ago or so. Did have a CT scan of the chest that have personally reviewed showing no evidence of pulmonary nodules.  He does not Gallbladder to assess the inguinal anatomy. CBC and CMP is normal.  He is able to perform more than 4 METS of activity without any shortness of breath or chest pain.  He does not wear any oxygen. At Some point time he saw Dr. Hervey Ard 5 years ago for some abdominal pain and there was no evidence of abdominal wall hernia.   HPI  Past Medical History:  Diagnosis Date   BPH (benign prostatic hyperplasia) 09/16/2014   Cataract    left eye   Chronic back pain 09/16/2014   Colon polyps    COPD (chronic obstructive pulmonary disease) (HCC)    mild   DDD (degenerative disc disease), lumbar 09/16/2014   Full dentures    Inguinal hernia 2004   Insomnia 09/16/2014   Personal history of tobacco use, presenting hazards to health 03/10/2016   Prostate disorder    S/P lumbar spinal fusion 05/09/2015   Sleeping difficulties    Tobacco use disorder 09/16/2014   Varicella zoster 11/19/2015    Past Surgical History:  Procedure Laterality Date   ABDOMINAL EXPOSURE N/A 05/09/2015   Procedure: ABDOMINAL EXPOSURE;  Surgeon: Rosetta Posner, MD;  Location: [facility]  NEURO ORS;  Service: Vascular;  Laterality: N/A;   ANTERIOR LUMBAR FUSION N/A 05/09/2015   Procedure: LUMBAR FOUR-FIVE ANTERIOR LUMBAR FUSION WITH ABDOMINAL EXPOSURE;  Surgeon: Eustace Moore, MD;  Location: [place] NEURO ORS;  Service: Neurosurgery;  Laterality: N/A;   COLONOSCOPY W/ BIOPSIES AND POLYPECTOMY     COLONOSCOPY WITH PROPOFOL N/A 03/18/2017   Procedure: COLONOSCOPY WITH PROPOFOL;  Surgeon: [name], MD;  Location: [facility] ENDOSCOPY;  Service: Endoscopy;  Laterality: N/A;   FOOT SURGERY Right 2021   GANGLION CYST EXCISION     left leg   HERNIA REPAIR Left    inguinal at 40    TOOTH EXTRACTION      Family History  Problem Relation Age of Onset   Cancer Mother        Lung   Heart disease Mother    Cancer Father        Lung with mets to brain   Cancer Sister        Lung    Social History Social History   Tobacco Use   Smoking status: Former    Types: Cigars    Quit date: 11/04/2018    Years since quitting: 3.5   Smokeless tobacco: Never  Vaping Use   Vaping Use: Never used  Substance Use Topics   Alcohol use: Not Currently    Comment: occassionally on social occassions  Drug use: No    No Known Allergies  Current Outpatient Medications  Medication Sig Dispense Refill   albuterol (VENTOLIN HFA) 108 (90 Base) MCG/ACT inhaler Inhale 2 puffs into the lungs every 6 (six) hours as needed for wheezing or shortness of breath. 8 g 0   budesonide-formoterol (SYMBICORT) 160-4.5 MCG/ACT inhaler Inhale 2 puffs into the lungs 2 (two) times daily. 1 each 5   buPROPion (ZYBAN) 150 MG 12 hr tablet Take 1 tablet (150 mg total) by mouth daily. 90 tablet 3   DULoxetine (CYMBALTA) 30 MG capsule Take 1 capsule (30 mg total) by mouth daily. for anxiety and depression. 90 capsule 0   pravastatin (PRAVACHOL) 40 MG tablet Take 1 tablet (40 mg total) by mouth daily. For cholesterol. Office visit required for further refills. 90 tablet 0   tadalafil (CIALIS) 10 MG tablet TAKE ONE TABLET BY  MOUTH 60 MINUTES PRIOR TO INTERCOURSE AS NEEDED 30 tablet 0   traZODone (DESYREL) 100 MG tablet Take 1 tablet (100 mg total) by mouth at bedtime as needed. for sleep. Office visit required for further refills. 90 tablet 0   No current facility-administered medications for this visit.     Review of Systems Full ROS  was asked and was negative except for the information on the HPI  Physical Exam Blood pressure 132/75, pulse 79, temperature 97.7 F (36.5 C), temperature source Oral, height 6' (1.829 m), weight 133 lb 9.6 oz (60.6 kg), SpO2 99 %. CONSTITUTIONAL: NAD. EYES: Pupils are equal, round, Sclera are non-icteric. EARS, NOSE, MOUTH AND THROAT: The oral mucosa is pink and moist. Hearing is intact to voice. LYMPH NODES:  Lymph nodes in the neck are normal. RESPIRATORY:  Lungs are clear. There is normal respiratory effort, with equal breath sounds bilaterally, and without pathologic use of accessory muscles. CARDIOVASCULAR: Heart is regular without murmurs, gallops, or rubs. GI: The abdomen is  soft, nontender, and nondistended. He does have thickening within the soft tissue of the left inguinal region with some tenderness to palpation.  I cannot definitively feel a true inguinal hernia, but exam is limited due to pain.  He points out at the obturator canal but I am unable to feel a true defect either in the obturator canal or the femoral canal.  There are no palpable masses. There is no hepatosplenomegaly. There are normal bowel sounds GU: Rectal deferred.   MUSCULOSKELETAL: Normal muscle strength and tone. No cyanosis or edema.   SKIN: Turgor is good and there are no pathologic skin lesions or ulcers. NEUROLOGIC: Motor and sensation is grossly normal. Cranial nerves are grossly intact. PSYCH:  Oriented to person, place and time. Affect is normal.  Data Reviewed  I have personally reviewed the patient's imaging, laboratory findings and medical records.    Assessment/Plan 66 year old  male with prior history of bilateral inguinal hernia repair now with chronic left inguinal pain and questionable obturator hernia.  Discussed with the patient in detail about his disease process.  Further operative business is to evaluate his inguinal anatomy.  I will start with a CT scan of the abdomen and pelvis.  Discussed with him in detail about chronic inguinodynia and multimodal therapies.  At this time he does not need emergent surgical intervention or hospitalization.  He is Patent attorney. I Spent 60 minutes in this encounter including personally reviewing imaging studies, coordinating his care, counseling the patient and performing appropriate documentation     Caroleen Hamman, MD [place] Surgeon 05/07/2022, 10:37 AM

## 2022-05-14 ENCOUNTER — Ambulatory Visit
Admission: RE | Admit: 2022-05-14 | Discharge: 2022-05-14 | Disposition: A | Discharge status: Home / Self Care | Payer: No Typology Code available for payment source | Source: Ambulatory Visit | Attending: Surgery | Admitting: Surgery

## 2022-05-14 DIAGNOSIS — K429 Umbilical hernia without obstruction or gangrene: Secondary | ICD-10-CM | POA: Diagnosis not present

## 2022-05-14 DIAGNOSIS — K760 Fatty (change of) liver, not elsewhere classified: Secondary | ICD-10-CM | POA: Diagnosis not present

## 2022-05-14 DIAGNOSIS — R1030 Lower abdominal pain, unspecified: Secondary | ICD-10-CM | POA: Diagnosis not present

## 2022-05-14 MED ORDER — IOHEXOL 300 MG/ML  SOLN
85.0000 mL | Freq: Once | INTRAMUSCULAR | Status: AC | PRN
  Administered 2022-05-14: 85 mL via INTRAVENOUS

## 2022-05-24 ENCOUNTER — Ambulatory Visit (INDEPENDENT_AMBULATORY_CARE_PROVIDER_SITE_OTHER): Payer: No Typology Code available for payment source | Admitting: Surgery

## 2022-05-24 ENCOUNTER — Encounter: Payer: Self-pay | Admitting: Surgery

## 2022-05-24 VITALS — BP 119/71 | HR 70 | Temp 97.7°F | Ht 73.0 in | Wt 134.0 lb

## 2022-05-24 DIAGNOSIS — R1032 Left lower quadrant pain: Secondary | ICD-10-CM | POA: Diagnosis not present

## 2022-05-24 DIAGNOSIS — R1030 Lower abdominal pain, unspecified: Secondary | ICD-10-CM

## 2022-05-24 NOTE — Patient Instructions (Signed)
If you decide you would like to try Gabapentin or a nerve block injection just let us know.   Follow-up with our office as needed.  Please call and ask to speak with a nurse if you develop questions or concerns.

## 2022-05-26 ENCOUNTER — Encounter: Payer: Self-pay | Admitting: Surgery

## 2022-05-26 NOTE — Progress Notes (Signed)
Outpatient Surgical Follow Up  05/26/2022  Don Christian is an 66 y.o. male.   Chief Complaint  Patient presents with   Follow-up    HPI: Don Christian is a 66 y.o. male f/u for left inguinal or pain.  The pain is intermittent sharp no specific alleviating or aggravating factors.  Moderate intensity.Still persistent but not interfering w quality of life. He Does have a history of significant degenerative disc disease COPD.  He did have a history of lumbar fusion with an anterior approach via an abdominal extraperitoneal approach in 2016. He used to smoke heavily and quit 3 years ago or so. We repeated another CT scan of the A/P that I have personally reviewed showing no evidence of inguinal or obturator hernias.        Past Medical History:  Diagnosis Date   BPH (benign prostatic hyperplasia) 09/16/2014   Cataract    left eye   Chronic back pain 09/16/2014   Colon polyps    COPD (chronic obstructive pulmonary disease) ([place])    mild   DDD (degenerative disc disease), lumbar 09/16/2014   Full dentures    Inguinal hernia 2004   Insomnia 09/16/2014   Personal history of tobacco use, presenting hazards to health 03/10/2016   Prostate disorder    S/P lumbar spinal fusion 05/09/2015   Sleeping difficulties    Tobacco use disorder 09/16/2014   Varicella zoster 11/19/2015    Past Surgical History:  Procedure Laterality Date   ABDOMINAL EXPOSURE N/A 05/09/2015   Procedure: ABDOMINAL EXPOSURE;  Surgeon: Rosetta Posner, MD;  Location: [facility] NEURO ORS;  Service: Vascular;  Laterality: N/A;   ANTERIOR LUMBAR FUSION N/A 05/09/2015   Procedure: LUMBAR FOUR-FIVE ANTERIOR LUMBAR FUSION WITH ABDOMINAL EXPOSURE;  Surgeon: Eustace Moore, MD;  Location: [place] NEURO ORS;  Service: Neurosurgery;  Laterality: N/A;   COLONOSCOPY W/ BIOPSIES AND POLYPECTOMY     COLONOSCOPY WITH PROPOFOL N/A 03/18/2017   Procedure: COLONOSCOPY WITH PROPOFOL;  Surgeon: [name], MD;  Location: [facility] ENDOSCOPY;  Service:  Endoscopy;  Laterality: N/A;   FOOT SURGERY Right 2021   GANGLION CYST EXCISION     left leg   HERNIA REPAIR Left    inguinal at 51    TOOTH EXTRACTION      Family History  Problem Relation Age of Onset   Cancer Mother        Lung   Heart disease Mother    Cancer Father        Lung with mets to brain   Cancer Sister        Lung    Social History:  reports that he quit smoking about 3 years ago. His smoking use included cigars. He has been exposed to tobacco smoke. He has never used smokeless tobacco. He reports that he does not currently use alcohol. He reports that he does not use drugs.  Allergies: No Known Allergies  Medications reviewed.    ROS Full ROS performed and is otherwise negative other than what is stated in HPI   BP 119/71   Pulse 70   Temp 97.7 F (36.5 C)   Ht '6\' 1"'$  (1.854 m)   Wt 134 lb (60.8 kg)   SpO2 99%   BMI 17.68 kg/m   Physical Exam CONSTITUTIONAL: NAD. EYES: Pupils are equal, round, Sclera are non-icteric. GI: The abdomen is  soft, nontender, and nondistended. He does have thickening within the soft tissue of the left inguinal region with some tenderness to palpation.  I cannot definitively feel a true inguinal hernia, but exam is limited due to pain.  He points out at the obturator canal but I am unable to feel a true defect either in the obturator canal or the femoral canal.  There are no palpable masses. There is no hepatosplenomegaly. There are normal bowel sounds GU: Rectal deferred.   MUSCULOSKELETAL: Normal muscle strength and tone. No cyanosis or edema.   SKIN: Turgor is good and there are no pathologic skin lesions or ulcers. NEUROLOGIC: Motor and sensation is grossly normal. Cranial nerves are grossly intact. PSYCH:  Oriented to person, place and time. Affect is normal.   Assessment/Plan:  1. Inguinal pain, unspecified laterality Extensive discussion with patient regarding his disease process.  He does have chronic  inguinodynia.  Alternatives of gabapentin versus ilioinguinal nerve block versus current measures were discussed with him in detail.  Currently he does not wish to get any further therapies at this time.  He is advised to return if symptoms worsens.  No need for surgical intervention at this time  I spent 30 minutes in this encounter including personally reviewing imaging studies, medical records, coordinating  his care, counseling the patient, patient orders and performing appropriate documentation.  Caroleen Hamman, MD [facility] General Surgeon

## 2022-06-01 ENCOUNTER — Ambulatory Visit (INDEPENDENT_AMBULATORY_CARE_PROVIDER_SITE_OTHER): Payer: No Typology Code available for payment source | Admitting: Primary Care

## 2022-06-01 ENCOUNTER — Encounter: Payer: Self-pay | Admitting: Primary Care

## 2022-06-01 DIAGNOSIS — F32A Depression, unspecified: Secondary | ICD-10-CM

## 2022-06-01 DIAGNOSIS — J439 Emphysema, unspecified: Secondary | ICD-10-CM | POA: Diagnosis not present

## 2022-06-01 DIAGNOSIS — G4700 Insomnia, unspecified: Secondary | ICD-10-CM

## 2022-06-01 DIAGNOSIS — F419 Anxiety disorder, unspecified: Secondary | ICD-10-CM | POA: Diagnosis not present

## 2022-06-01 DIAGNOSIS — R1032 Left lower quadrant pain: Secondary | ICD-10-CM

## 2022-06-01 NOTE — Assessment & Plan Note (Signed)
Controlled.  Discussed to resume Symbicort if he finds himself using his albuterol inhaler more than 3 times weekly. Continue Symbicort seasonally as needed. Continue albuterol inhaler as needed.

## 2022-06-01 NOTE — Progress Notes (Signed)
Subjective:    Patient ID: Don Christian, male    DOB: 03/24/1956, 66 y.o.   MRN: [id number]  HPI  Don Christian is a very pleasant 66 y.o. male with a history of insomnia, prediabetes, hyperlipidemia, aortic atherosclerosis, pulmonary emphysema who presents today for follow-up of multiple issues.   1) Pulmonary Emphysema: Currently managed on Symbicort 160-4.5 mcg inhaler, 2 puffs BID that was initiated one month ago as he was using his albuterol inhaler daily to multiple times daily. He also follows with lung cancer screening program and is up to date.  Since his last visit he is not using his Symbicort daily as he doesn't feel that he needs it. He has not used his albuterol in one week ago after mowing the lawn.   2) DDD/Inguinal Pain: Chronic lower back pain for years. During his visit one month ago we initiated Cymbalta 30 mg daily for symptoms anxiety depression and also for chronic back pain.  Since his last visit he is compliant to his duloxetine 30 mg daily. He has not noticed improvement in his back pain but he has no concerns today.   Evaluated by general surgery twice, inguinal pain was determined to be secondary to mesh from prior hernia. It was recommended that he undergo mesh removal, but he kindly declined as he doesn't care to undergo general anesthesia.   3) Anxiety and Depression: No prior diagnosed history, but during his last visit he endorsed symptoms represented anxiety depression.  Currently managed on Cymbalta 30 mg daily which is initiated 1 month ago.  Since his last visit he's feeling less anxious, less irritable, and less down. His move has improved. He thinks it's caused some insomnia during the night as he's not sleeping during the night. He is managed on Trazodone 100 mg nightly.   Review of Systems  Respiratory:  Negative for shortness of breath.   Cardiovascular:  Negative for chest pain.  Musculoskeletal:  Positive for arthralgias and back pain.   Neurological:  Negative for headaches.  Psychiatric/Behavioral:  Positive for sleep disturbance. The patient is not nervous/anxious.          Past Medical History:  Diagnosis Date   BPH (benign prostatic hyperplasia) 09/16/2014   Cataract    left eye   Chronic back pain 09/16/2014   Colon polyps    COPD (chronic obstructive pulmonary disease) ([place])    mild   DDD (degenerative disc disease), lumbar 09/16/2014   Full dentures    Inguinal hernia 2004   Insomnia 09/16/2014   Personal history of tobacco use, presenting hazards to health 03/10/2016   Prostate disorder    S/P lumbar spinal fusion 05/09/2015   Sleeping difficulties    Tobacco use disorder 09/16/2014   Varicella zoster 11/19/2015    Social History   Socioeconomic History   Marital status: Married    Spouse name: Not on file   Number of children: Not on file   Years of education: Not on file   Highest education level: Not on file  Occupational History   Not on file  Tobacco Use   Smoking status: Former    Types: Cigars    Quit date: 11/04/2018    Years since quitting: 3.5    Passive exposure: Past   Smokeless tobacco: Never  Vaping Use   Vaping Use: Never used  Substance and Sexual Activity   Alcohol use: Not Currently    Comment: occassionally on social occassions   Drug use: No  Sexual activity: Yes  Other Topics Concern   Not on file  Social History Narrative   ** Merged History Encounter **       Work or School: no work currently, disabled from back injury in 2012 Home Situation: lives with wife Spiritual Beliefs: none Lifestyle: walks dog 2x per day;  Healthy diet per his report. Enjoys spending time outdoors.      Social Determinants of Health   Financial Resource Strain: Low Risk  (11/13/2021)   Overall Financial Resource Strain (CARDIA)    Difficulty of Paying Living Expenses: Not hard at all  Food Insecurity: No Food Insecurity (11/13/2021)   Hunger Vital Sign    Worried About Running  Out of Food in the Last Year: Never true    Ran Out of Food in the Last Year: Never true  Transportation Needs: No Transportation Needs (11/13/2021)   PRAPARE - Hydrologist (Medical): No    Lack of Transportation (Non-Medical): No  Physical Activity: Sufficiently Active (11/13/2021)   Exercise Vital Sign    Days of Exercise per Week: 7 days    Minutes of Exercise per Session: 40 min  Stress: No Stress Concern Present (11/13/2021)   [place]    Feeling of Stress : Only a little  Social Connections: Moderately Isolated (11/13/2021)   Social Connection and Isolation Panel [NHANES]    Frequency of Communication with Friends and Family: More than three times a week    Frequency of Social Gatherings with Friends and Family: More than three times a week    Attends Religious Services: Never    Marine scientist or Organizations: No    Attends Archivist Meetings: Never    Marital Status: Married  Human resources officer Violence: Not At Risk (11/13/2021)   Humiliation, Afraid, Rape, and Kick questionnaire    Fear of Current or Ex-Partner: No    Emotionally Abused: No    Physically Abused: No    Sexually Abused: No    Past Surgical History:  Procedure Laterality Date   ABDOMINAL EXPOSURE N/A 05/09/2015   Procedure: ABDOMINAL EXPOSURE;  Surgeon: Rosetta Posner, MD;  Location: [facility] NEURO ORS;  Service: Vascular;  Laterality: N/A;   ANTERIOR LUMBAR FUSION N/A 05/09/2015   Procedure: LUMBAR FOUR-FIVE ANTERIOR LUMBAR FUSION WITH ABDOMINAL EXPOSURE;  Surgeon: Eustace Moore, MD;  Location: [place] NEURO ORS;  Service: Neurosurgery;  Laterality: N/A;   COLONOSCOPY W/ BIOPSIES AND POLYPECTOMY     COLONOSCOPY WITH PROPOFOL N/A 03/18/2017   Procedure: COLONOSCOPY WITH PROPOFOL;  Surgeon: [name], MD;  Location[facility] END[facility]COPY;  Service: Endoscopy;  Laterality: N/A;   FOOT SURGERY Right 2021   GANGLION CYST  EXCISION     left leg   HERNIA REPAIR Left    inguinal at 13    TOOTH EXTRACTION      Family History  Problem Relation Age of Onset   Cancer Mother        Lung   Heart disease Mother    Cancer Father        Lung with mets to brain   Cancer Sister        Lung    No Known Allergies  Current Outpatient Medications on File Prior to Visit  Medication Sig Dispense Refill   albuterol (VENTOLIN HFA) 108 (90 Base) MCG/ACT inhaler Inhale 2 puffs into the lungs every 6 (six) hours as needed for wheezing or  shortness of breath. 8 g 0   budesonide-formoterol (SYMBICORT) 160-4.5 MCG/ACT inhaler Inhale 2 puffs into the lungs 2 (two) times daily. 1 each 5   buPROPion (ZYBAN) 150 MG 12 hr tablet Take 1 tablet (150 mg total) by mouth daily. 90 tablet 3   DULoxetine (CYMBALTA) 30 MG capsule Take 1 capsule (30 mg total) by mouth daily. for anxiety and depression. 90 capsule 0   pravastatin (PRAVACHOL) 40 MG tablet Take 1 tablet (40 mg total) by mouth daily. For cholesterol. Office visit required for further refills. 90 tablet 0   tadalafil (CIALIS) 10 MG tablet TAKE ONE TABLET BY MOUTH 60 MINUTES PRIOR TO INTERCOURSE AS NEEDED 30 tablet 0   traZODone (DESYREL) 100 MG tablet Take 1 tablet (100 mg total) by mouth at bedtime as needed. for sleep. Office visit required for further refills. 90 tablet 0   No current facility-administered medications on file prior to visit.    BP 126/68   Pulse (!) 55   Temp 98.6 F (37 C) (Oral)   Ht '6\' 1"'$  (1.854 m)   Wt 137 lb (62.1 kg)   SpO2 99%   BMI 18.07 kg/m  Objective:   Physical Exam Cardiovascular:     Rate and Rhythm: Normal rate and regular rhythm.  Pulmonary:     Effort: Pulmonary effort is normal.     Breath sounds: Normal breath sounds. No wheezing or rales.  Musculoskeletal:     Cervical back: Neck supple.  Skin:    General: Skin is warm and dry.  Neurological:     Mental Status: He is alert and oriented to person, place, and time.   Psychiatric:        Mood and Affect: Mood normal.           Assessment & Plan:   Problem List Items Addressed This Visit       Respiratory   Pulmonary emphysema ([place])    Controlled.  Discussed to resume Symbicort if he finds himself using his albuterol inhaler more than 3 times weekly. Continue Symbicort seasonally as needed. Continue albuterol inhaler as needed.        Other   Insomnia    Anxiety pression symptoms improved, however sleep disturbance continues.  Continue duloxetine 30 mg daily for now. Continue trazodone 100 mg daily for now.  Add melatonin at bedtime.  He will update if no improvement.      Anxiety and depression    Improved.  Continue duloxetine 30 mg daily. Continue to monitor.      Left inguinal pain    Reviewed office notes from general surgery from July 2023 and August 2023.  For now he is complacent with his chronic left inguinal pain. He will notify general surgery if he decides to have mesh removed.          Pleas Koch, NP

## 2022-06-01 NOTE — Assessment & Plan Note (Signed)
Improved.  Continue duloxetine 30 mg daily. Continue to monitor.

## 2022-06-01 NOTE — Patient Instructions (Signed)
Resume Symbicort inhaler twice daily if you find yourself using the albuterol inhaler more than 3 times weekly consistently.  Continue taking duloxetine (Cymbalta) daily for anxiety/depression.  It was a pleasure to see you today!

## 2022-06-01 NOTE — Assessment & Plan Note (Signed)
Reviewed office notes from general surgery from July 2023 and August 2023.  For now he is complacent with his chronic left inguinal pain. He will notify general surgery if he decides to have mesh removed.

## 2022-06-01 NOTE — Assessment & Plan Note (Signed)
Anxiety pression symptoms improved, however sleep disturbance continues.  Continue duloxetine 30 mg daily for now. Continue trazodone 100 mg daily for now.  Add melatonin at bedtime.  He will update if no improvement.

## 2022-06-02 ENCOUNTER — Ambulatory Visit
Admission: RE | Admit: 2022-06-02 | Discharge: 2022-06-02 | Disposition: A | Discharge status: Home / Self Care | Payer: No Typology Code available for payment source | Source: Ambulatory Visit | Attending: Acute Care | Admitting: Acute Care

## 2022-06-02 DIAGNOSIS — Z122 Encounter for screening for malignant neoplasm of respiratory organs: Secondary | ICD-10-CM | POA: Diagnosis not present

## 2022-06-02 DIAGNOSIS — Z87891 Personal history of nicotine dependence: Secondary | ICD-10-CM | POA: Diagnosis not present

## 2022-06-02 DIAGNOSIS — F17210 Nicotine dependence, cigarettes, uncomplicated: Secondary | ICD-10-CM | POA: Insufficient documentation

## 2022-06-02 DIAGNOSIS — J439 Emphysema, unspecified: Secondary | ICD-10-CM | POA: Diagnosis not present

## 2022-06-02 DIAGNOSIS — I700 Atherosclerosis of aorta: Secondary | ICD-10-CM | POA: Insufficient documentation

## 2022-06-03 ENCOUNTER — Ambulatory Visit: Payer: No Typology Code available for payment source | Admitting: Primary Care

## 2022-06-04 ENCOUNTER — Other Ambulatory Visit: Payer: Self-pay

## 2022-06-04 DIAGNOSIS — F17210 Nicotine dependence, cigarettes, uncomplicated: Secondary | ICD-10-CM

## 2022-06-04 DIAGNOSIS — Z87891 Personal history of nicotine dependence: Secondary | ICD-10-CM

## 2022-06-04 DIAGNOSIS — Z122 Encounter for screening for malignant neoplasm of respiratory organs: Secondary | ICD-10-CM

## 2022-06-09 ENCOUNTER — Other Ambulatory Visit: Payer: Self-pay | Admitting: Primary Care

## 2022-06-09 DIAGNOSIS — F17200 Nicotine dependence, unspecified, uncomplicated: Secondary | ICD-10-CM

## 2022-06-28 ENCOUNTER — Other Ambulatory Visit: Payer: Self-pay | Admitting: Primary Care

## 2022-06-28 ENCOUNTER — Telehealth: Payer: Self-pay

## 2022-06-28 DIAGNOSIS — E782 Mixed hyperlipidemia: Secondary | ICD-10-CM

## 2022-06-28 DIAGNOSIS — I700 Atherosclerosis of aorta: Secondary | ICD-10-CM

## 2022-06-28 NOTE — Telephone Encounter (Signed)
Error

## 2022-07-12 ENCOUNTER — Other Ambulatory Visit: Payer: Self-pay | Admitting: Primary Care

## 2022-07-12 DIAGNOSIS — G4700 Insomnia, unspecified: Secondary | ICD-10-CM

## 2022-07-18 ENCOUNTER — Other Ambulatory Visit: Payer: Self-pay | Admitting: Primary Care

## 2022-07-18 DIAGNOSIS — F32A Depression, unspecified: Secondary | ICD-10-CM

## 2022-08-03 ENCOUNTER — Other Ambulatory Visit: Payer: Self-pay

## 2022-08-03 ENCOUNTER — Emergency Department
Admission: EM | Admit: 2022-08-03 | Discharge: 2022-08-03 | Disposition: A | Discharge status: Home / Self Care | Payer: No Typology Code available for payment source | Attending: Emergency Medicine | Admitting: Emergency Medicine

## 2022-08-03 ENCOUNTER — Emergency Department: Payer: No Typology Code available for payment source

## 2022-08-03 DIAGNOSIS — G454 Transient global amnesia: Secondary | ICD-10-CM

## 2022-08-03 DIAGNOSIS — I639 Cerebral infarction, unspecified: Secondary | ICD-10-CM | POA: Diagnosis not present

## 2022-08-03 DIAGNOSIS — K4090 Unilateral inguinal hernia, without obstruction or gangrene, not specified as recurrent: Secondary | ICD-10-CM | POA: Insufficient documentation

## 2022-08-03 DIAGNOSIS — Z0389 Encounter for observation for other suspected diseases and conditions ruled out: Secondary | ICD-10-CM | POA: Diagnosis not present

## 2022-08-03 DIAGNOSIS — R9431 Abnormal electrocardiogram [ECG] [EKG]: Secondary | ICD-10-CM | POA: Diagnosis not present

## 2022-08-03 DIAGNOSIS — G8929 Other chronic pain: Secondary | ICD-10-CM | POA: Insufficient documentation

## 2022-08-03 DIAGNOSIS — R410 Disorientation, unspecified: Secondary | ICD-10-CM | POA: Diagnosis not present

## 2022-08-03 DIAGNOSIS — M545 Low back pain, unspecified: Secondary | ICD-10-CM | POA: Diagnosis not present

## 2022-08-03 LAB — COMPREHENSIVE METABOLIC PANEL
ALT: 15 U/L (ref 0–44)
AST: 19 U/L (ref 15–41)
Albumin: 4.3 g/dL (ref 3.5–5.0)
Alkaline Phosphatase: 67 U/L (ref 38–126)
Anion gap: 8 (ref 5–15)
BUN: 14 mg/dL (ref 8–23)
CO2: 27 mmol/L (ref 22–32)
Calcium: 9.5 mg/dL (ref 8.9–10.3)
Chloride: 100 mmol/L (ref 98–111)
Creatinine, Ser: 0.85 mg/dL (ref 0.61–1.24)
GFR, Estimated: >60 mL/min (ref >60)
Glucose, Bld: 142 mg/dL — ABNORMAL HIGH (ref 70–99)
Potassium: 4.7 mmol/L (ref 3.5–5.1)
Sodium: 135 mmol/L (ref 135–145)
Total Bilirubin: 0.8 mg/dL (ref 0.3–1.2)
Total Protein: 7.5 g/dL (ref 6.5–8.1)

## 2022-08-03 LAB — CBG MONITORING, ED: Glucose-Capillary: 118 mg/dL — ABNORMAL HIGH (ref 70–99)

## 2022-08-03 LAB — DIFFERENTIAL
Abs Immature Granulocytes: 0.02 10*3/uL (ref 0.00–0.07)
Basophils Absolute: 0.1 10*3/uL (ref 0.0–0.1)
Basophils Relative: 1 %
Eosinophils Absolute: 0 10*3/uL (ref 0.0–0.5)
Eosinophils Relative: 1 %
Immature Granulocytes: 0 %
Lymphocytes Relative: 23 %
Lymphs Abs: 1.6 10*3/uL (ref 0.7–4.0)
Monocytes Absolute: 0.5 10*3/uL (ref 0.1–1.0)
Monocytes Relative: 7 %
Neutro Abs: 4.6 10*3/uL (ref 1.7–7.7)
Neutrophils Relative %: 68 %

## 2022-08-03 LAB — CBC
HCT: 43 % (ref 39.0–52.0)
Hemoglobin: 14.4 g/dL (ref 13.0–17.0)
MCH: 30.1 pg (ref 26.0–34.0)
MCHC: 33.5 g/dL (ref 30.0–36.0)
MCV: 89.8 fL (ref 80.0–100.0)
Platelets: 212 10*3/uL (ref 150–400)
RBC: 4.79 MIL/uL (ref 4.22–5.81)
RDW: 12.6 % (ref 11.5–15.5)
WBC: 6.7 10*3/uL (ref 4.0–10.5)
nRBC: 0 % (ref 0.0–0.2)

## 2022-08-03 LAB — ETHANOL: Alcohol, Ethyl (B): <10 mg/dL (ref <10)

## 2022-08-03 LAB — PROTIME-INR
INR: 1 (ref 0.8–1.2)
Prothrombin Time: 13.1 seconds (ref 11.4–15.2)

## 2022-08-03 LAB — APTT: aPTT: 31 seconds (ref 24–36)

## 2022-08-03 MED ORDER — SODIUM CHLORIDE 0.9% FLUSH: 3.0000 mL | Freq: Once | INTRAVENOUS | Status: DC

## 2022-08-03 MED ORDER — ONDANSETRON 4 MG PO TBDP
4.0000 mg | ORAL_TABLET | Freq: Once | ORAL | Status: DC
  Filled 2022-08-03: qty 1

## 2022-08-03 NOTE — ED Triage Notes (Addendum)
Pt to ED via POV from home. Pt accompanied by wife. Wife states pt woke up normal and before he was leaving for a massage pt became acutely confused. Pt repeatedly stating in triage, "I have no idea what's going on." Pt denies blurry vision. Pt A&Ox2. No numbness/tinging, no unilateral weakness. No known hx.   Pt recently had dental work and is on steroids. Code Stroke called by this RN at [phone]

## 2022-08-03 NOTE — Progress Notes (Signed)
  Chaplain On-Call responded to Code Stroke notification to ED room 17 at 0916 hours.  Chaplain received update from Nurse who reported the Code Stroke has been cancelled.  Chaplain met the patient and his wife Sharyn Lull, and provided spiritual and emotional support for them.  Chaplain Pollyann Samples M.Div., [facility]

## 2022-08-03 NOTE — ED Notes (Signed)
  Pt to Ct at this time with this RN

## 2022-08-03 NOTE — ED Notes (Signed)
Pt stating he is ready to go home, "feels normal" and wife is agreeable. Pt wife stating they have already spoken with provider about this and are awaiting MRI. WCTM.   Pt independently ambulatory to and from restroom without difficulty.

## 2022-08-03 NOTE — ED Notes (Signed)
Called to [facility]/activated code stroke per RN Kelly/Rep:Thomas

## 2022-08-03 NOTE — Progress Notes (Signed)
Telestroke RN Note  0920: Code stroke activated via cart. Patient currently in [place]: Pt back in room from CT. Dr. Leonel Ramsay at bedside assessing patient. [place] 0800, MRS 0 & NIHSS 2.   0931: Signed off cart.

## 2022-08-03 NOTE — Discharge Instructions (Signed)
Follow-up with your primary care provider.  Return to the ER for new, worsening, or recurrent memory loss, confusion or change in mental status, weakness or numbness, difficulty with vision or speech, or any other new or worsening symptoms that concern you.

## 2022-08-03 NOTE — ED Provider Notes (Signed)
[facility] Provider Note    Event Date/Time   First MD Initiated Contact with Patient 08/03/22 0920     (approximate)   History   Altered Mental Status and Code Stroke   HPI  Don Christian is a 66 y.o. male with a history of prediabetes, hyperlipidemia, and emphysema who presents with memory loss, acute onset this morning around 8 AM.  The patient appears confused and seems unable to form new memories.  The patient himself denies any other acute symptoms.  He has no vision changes, numbness, weakness, tingling, or difficulty speaking.  He has no prior history of similar episodes.  I reviewed the past medical records.  The patient has no recent ED visits or admissions.  His last outpatient encounter was on 06/01/2022 at [facility] primary care for emphysema and chronic lower back and inguinal hernia pain.   Physical Exam   Triage Vital Signs: ED Triage Vitals  Enc Vitals Group     BP 08/03/22 0912 (!) 149/95     Pulse Rate 08/03/22 0912 92     Resp 08/03/22 0912 18     Temp 08/03/22 0912 98.9 F (37.2 C)     Temp Source 08/03/22 0912 Oral     SpO2 08/03/22 0912 99 %     Weight --      Height --      Head Circumference --      Peak Flow --      Pain Score 08/03/22 0905 0     Pain Loc --      Pain Edu? --      Excl. in [place]? --     Most recent vital signs: Vitals:   08/03/22 1011 08/03/22 1030  BP:  132/75  Pulse: 70 63  Resp: 19 13  Temp:    SpO2: 100% 100%     General: Alert, no distress.  CV:  Good peripheral perfusion.  Resp:  Normal effort.  Abd:  No distention.  Other:  EOMI.  PERRLA.  No facial droop.  Motor and sensory intact in all extremities.  Normal coordination.  Answering questions and following commands appropriately, normal attention, however with anterograde amnesia.   ED Results / Procedures / Treatments   Labs (all labs ordered are listed, but only abnormal results are displayed) Labs Reviewed  COMPREHENSIVE  METABOLIC PANEL - Abnormal; Notable for the following components:      Result Value   Glucose, Bld 142 (*)    All other components within normal limits  CBG MONITORING, ED - Abnormal; Notable for the following components:   Glucose-Capillary 118 (*)    All other components within normal limits  PROTIME-INR  APTT  CBC  DIFFERENTIAL  ETHANOL     EKG  ED ECG REPORT I, Arta Silence, the attending physician, personally viewed and interpreted this ECG.  Date: 08/03/2022 EKG Time: 09 10 Rate: 87 Rhythm: normal sinus rhythm QRS Axis: normal Intervals: normal ST/T Wave abnormalities: normal Narrative Interpretation: no evidence of acute ischemia; no significant change when compared to EKG of 06/20/2018    RADIOLOGY  CT head: I independently viewed and interpreted the images; there is no ICH.  Radiology report indicates no evidence of acute infarct.  PROCEDURES:  Critical Care performed: Yes, see critical care procedure note(s)  .Critical Care  Performed by: Arta Silence, MD Authorized by: Arta Silence, MD   Critical care provider statement:    Critical care time (minutes):  20   Critical  care was necessary to treat or prevent imminent or life-threatening deterioration of the following conditions:  CNS failure or compromise   Critical care was time spent personally by me on the following activities:  Development of treatment plan with patient or surrogate, discussions with consultants, evaluation of patient's response to treatment, examination of patient, ordering and review of laboratory studies, ordering and review of radiographic studies, ordering and performing treatments and interventions, pulse oximetry, re-evaluation of patient's condition, review of old charts and obtaining history from patient or surrogate    MEDICATIONS ORDERED IN ED: Medications  sodium chloride flush (NS) 0.9 % injection 3 mL (3 mLs Intravenous Not Given 08/03/22 0938)   ondansetron (ZOFRAN-ODT) disintegrating tablet 4 mg (0 mg Oral Hold 08/03/22 1011)     IMPRESSION / MDM / [place] / ED COURSE  I reviewed the triage vital signs and the nursing notes.  66 year old male with PMH as noted above presents with acute onset of anterograde amnesia.  He has no other neurologic symptoms.  Physical exam reveals normal vital signs and no focal neurologic deficits.  Code stroke was activated.  CT head is negative for acute findings.  EKG is nonischemic.  Differential diagnosis includes, but is not limited to, transient global amnesia, TIA, less likely acute CVA.  I consulted with Dr. Leonel Ramsay from neurology who evaluated the patient.  He advises that TGA is most likely.  He recommends an MRI and observation for several hours.  We will also obtain lab work-up.  Patient's presentation is most consistent with acute presentation with potential threat to life or bodily function.  The patient is on the cardiac monitor to evaluate for evidence of arrhythmia and/or significant heart rate changes.  ----------------------------------------- 2:25 PM on 08/03/2022 -----------------------------------------  The patient's symptoms have resolved.  He is back to his baseline mental status although still amnestic to coming to the hospital initially.  Lab work-up is unremarkable with no leukocytosis or anemia, normal electrolytes and glucose, and no other concerning findings.  MRI/MRI are negative.  The patient was also reevaluated by Dr. Leonel Ramsay.  He recommends discharge home with routine follow-up.  Overall presentation is consistent with TGA.  Return precautions given, and the patient and family member expressed understanding.    FINAL CLINICAL IMPRESSION(S) / ED DIAGNOSES   Final diagnoses:  Transient global amnesia     Rx / DC Orders   ED Discharge Orders     None        Note:  This document was prepared using Dragon voice recognition software  and may include unintentional dictation errors.    Arta Silence, MD 08/03/22 1426

## 2022-08-03 NOTE — ED Notes (Signed)
Wife reporting pt is no longer repeating self and "seems to be doing better, but still doesn't remember coming to the hospital".

## 2022-08-03 NOTE — Consult Note (Signed)
Neurology Consultation Reason for Consult: Memory difficulty Referring Physician: Kennith Gain  CC: Memory difficulty  History is obtained from: Patient  HPI: Don Christian is a 66 y.o. male who presents with new onset predominantly anterograde amnesia.  He was getting ready this morning to go to a massage, and got in the shower apparently normal.  Subsequently, after exiting the shower and writing everything massage, he repeatedly stated "I have no idea what is going on."  He was brought into the emergency department where code stroke was activated.   LKW: 8 AM tpa given?: no, not a stroke  Past Medical History:  Diagnosis Date   BPH (benign prostatic hyperplasia) 09/16/2014   Cataract    left eye   Chronic back pain 09/16/2014   Colon polyps    COPD (chronic obstructive pulmonary disease) ([place])    mild   DDD (degenerative disc disease), lumbar 09/16/2014   Full dentures    Inguinal hernia 2004   Insomnia 09/16/2014   Personal history of tobacco use, presenting hazards to health 03/10/2016   Prostate disorder    S/P lumbar spinal fusion 05/09/2015   Sleeping difficulties    Tobacco use disorder 09/16/2014   Varicella zoster 11/19/2015     Family History  Problem Relation Age of Onset   Cancer Mother        Lung   Heart disease Mother    Cancer Father        Lung with mets to brain   Cancer Sister        Lung     Social History:  reports that he quit smoking about 3 years ago. His smoking use included cigars. He has been exposed to tobacco smoke. He has never used smokeless tobacco. He reports that he does not currently use alcohol. He reports that he does not use drugs.   Exam: Current vital signs: BP 132/75   Pulse 63   Temp 98.9 F (37.2 C) (Oral)   Resp 13   SpO2 100%  Vital signs in last 24 hours: Temp:  [98.9 F (37.2 C)] 98.9 F (37.2 C) (10/31 0912) Pulse Rate:  [61-92] 63 (10/31 1030) Resp:  [13-19] 13 (10/31 1030) BP: (132-149)/(75-95) 132/75  (10/31 1030) SpO2:  [99 %-100 %] 100 % (10/31 1030)   Physical Exam  Constitutional: Appears well-developed and well-nourished.  Psych: Affect appropriate to situation Eyes: No scleral injection HENT: No OP obstruction MSK: no joint deformities.  Cardiovascular: Normal rate and regular rhythm.  Respiratory: Effort normal, non-labored breathing GI: Soft.  No distension. There is no tenderness.  Skin: WDI  Neuro: Mental Status: Patient is awake, alert, oriented to person, knows that he is in "hospital, I guess" he does not know the month, gives the year is 2021. He is able to spell world backwards without difficulty. Patient is able to give a clear and coherent history. No signs of aphasia or neglect.  Cranial Nerves: II: Visual Fields are full. Pupils are equal, round, and reactive to light.   III,IV, VI: EOMI without ptosis or diploplia.  V: Facial sensation is symmetric to temperature VII: Facial movement is symmetric.  VIII: hearing is intact to voice X: Uvula elevates symmetrically XI: Shoulder shrug is symmetric. XII: tongue is midline without atrophy or fasciculations.  Motor: Tone is normal. Bulk is normal. 5/5 strength was present in all four extremities.  Sensory: Sensation is symmetric to light touch and temperature in the arms and legs. Cerebellar: FNF intact bilaterally  I  have reviewed labs in epic and the results pertinent to this consultation are: Creatinine 0.85  I have reviewed the images obtained: CT head-negative except for possible hyperdense left MCA (not consistent with symptoms)  Impression: 66 year old male with acute amnestic syndrome with clear sensorium.  This presentation is very characteristic for transient global amnesia.  This is typically a self-limited syndrome which lasts anywhere from a few hours to 24 hours.  An MRI can be helpful in certain cases.  The retrograde portion of his amnesia should be expected to recover, but the anterograde  portion (e.g. what he is experiencing during the episode) would not be expected to be remembered.  Recommendations: 1) MRI brain 2) supportive care 3) can be discharged once he is reliably forming new memories.   Roland Rack, MD [facility] [phone]  If 7pm- 7am, please page neurology on call as listed in [place].

## 2022-08-04 ENCOUNTER — Telehealth: Payer: Self-pay

## 2022-08-04 NOTE — Telephone Encounter (Signed)
Patient called in to schedule hospital follow up from being in ER on 10/31. Scheduled for 11/06.

## 2022-08-09 ENCOUNTER — Ambulatory Visit (INDEPENDENT_AMBULATORY_CARE_PROVIDER_SITE_OTHER): Payer: No Typology Code available for payment source | Admitting: Primary Care

## 2022-08-09 ENCOUNTER — Encounter: Payer: Self-pay | Admitting: Primary Care

## 2022-08-09 VITALS — BP 152/78 | HR 85 | Temp 97.2°F | Ht 74.0 in | Wt 139.0 lb

## 2022-08-09 DIAGNOSIS — F32A Depression, unspecified: Secondary | ICD-10-CM

## 2022-08-09 DIAGNOSIS — F419 Anxiety disorder, unspecified: Secondary | ICD-10-CM

## 2022-08-09 DIAGNOSIS — G454 Transient global amnesia: Secondary | ICD-10-CM | POA: Diagnosis not present

## 2022-08-09 MED ORDER — DULOXETINE HCL 20 MG PO CPEP: 20.0000 mg | ORAL_CAPSULE | Freq: Every day | ORAL | 0 refills | Status: DC

## 2022-08-09 NOTE — Assessment & Plan Note (Signed)
Recent emergency department visit,  Reviewed hospital notes, labs, imaging. Reviewed neurology notes from consult.  Neuro exam today unremarkable. We will continue to closely monitor, wife has been monitoring.

## 2022-08-09 NOTE — Assessment & Plan Note (Signed)
Controlled,  Reduce Cymbalta to 20 mg daily. We also discussed taking this at night.   He will update if he continues to experience insomnia.

## 2022-08-09 NOTE — Progress Notes (Signed)
Subjective:    Patient ID: Don Christian, male    DOB: July 22, 1956, 66 y.o.   MRN: [id number]  HPI  Don Christian is a very pleasant 66 y.o. male with a history of aortic atherosclerosis, pulmonary emphysema, BPH, tobacco use disorder, anxiety depression, insomnia, prediabetes who presents today for ED follow-up. He would also like a dose reduction. His wife joins [place] today.   He presented to [facility] ED on 08/03/2022 for symptoms of memory loss and confusion that began about 2 hours prior.  During his stay in the ED code stroke was activated, CT head was negative for acute findings.  He underwent EKG which was negative for acute process.  Neurology consulted who suspected transient global amnesia and recommended MRI brain.  Lab work-up was grossly negative.  MRI brain was negative.  He was discharged home later that day with recommendations for outpatient follow-up.  Since his ED visit he denies a return of symptoms. He doesn't recall the events from his visit to the ED. His wife denies confusion, odd behavior, slurred speech, dizziness.   He would like do reduce the dose of his Cymbalta as his current dose has caused insomnia. He's tried taking his current dose of Cymbalta every other day and has noticed sleep disturbance on the days that he takes his Cymbalta.  He takes his Cymbalta in the morning.  Overall feels very well managed in terms of anxiety and depression.   Review of Systems  Respiratory:  Negative for shortness of breath.   Cardiovascular:  Negative for chest pain.  Neurological:  Negative for dizziness and headaches.  Psychiatric/Behavioral:  Positive for sleep disturbance. The patient is not nervous/anxious.          Past Medical History:  Diagnosis Date   BPH (benign prostatic hyperplasia) 09/16/2014   Cataract    left eye   Chronic back pain 09/16/2014   Colon polyps    COPD (chronic obstructive pulmonary disease) ([place])    mild   DDD (degenerative disc disease),  lumbar 09/16/2014   Full dentures    Inguinal hernia 2004   Insomnia 09/16/2014   Personal history of tobacco use, presenting hazards to health 03/10/2016   Prostate disorder    S/P lumbar spinal fusion 05/09/2015   Sleeping difficulties    Tobacco use disorder 09/16/2014   Varicella zoster 11/19/2015    Social History   Socioeconomic History   Marital status: Married    Spouse name: Not on file   Number of children: Not on file   Years of education: Not on file   Highest education level: Not on file  Occupational History   Not on file  Tobacco Use   Smoking status: Former    Types: Cigars    Quit date: 11/04/2018    Years since quitting: 3.7    Passive exposure: Past   Smokeless tobacco: Never  Vaping Use   Vaping Use: Never used  Substance and Sexual Activity   Alcohol use: Not Currently    Comment: occassionally on social occassions   Drug use: No   Sexual activity: Yes  Other Topics Concern   Not on file  Social History Narrative   ** Merged History Encounter **       Work or School: no work currently, disabled from back injury in 2012 Home Situation: lives with wife Spiritual Beliefs: none Lifestyle: walks dog 2x per day;  Healthy diet per his report. Enjoys spending time outdoors.  Social Determinants of Health   Financial Resource Strain: Low Risk  (11/13/2021)   Overall Financial Resource Strain (CARDIA)    Difficulty of Paying Living Expenses: Not hard at all  Food Insecurity: No Food Insecurity (11/13/2021)   Hunger Vital Sign    Worried About Running Out of Food in the Last Year: Never true    Ran Out of Food in the Last Year: Never true  Transportation Needs: No Transportation Needs (11/13/2021)   PRAPARE - Hydrologist (Medical): No    Lack of Transportation (Non-Medical): No  Physical Activity: Sufficiently Active (11/13/2021)   Exercise Vital Sign    Days of Exercise per Week: 7 days    Minutes of Exercise per  Session: 40 min  Stress: No Stress Concern Present (11/13/2021)   [place]    Feeling of Stress : Only a little  Social Connections: Moderately Isolated (11/13/2021)   Social Connection and Isolation Panel [NHANES]    Frequency of Communication with Friends and Family: More than three times a week    Frequency of Social Gatherings with Friends and Family: More than three times a week    Attends Religious Services: Never    Marine scientist or Organizations: No    Attends Archivist Meetings: Never    Marital Status: Married  Human resources officer Violence: Not At Risk (11/13/2021)   Humiliation, Afraid, Rape, and Kick questionnaire    Fear of Current or Ex-Partner: No    Emotionally Abused: No    Physically Abused: No    Sexually Abused: No    Past Surgical History:  Procedure Laterality Date   ABDOMINAL EXPOSURE N/A 05/09/2015   Procedure: ABDOMINAL EXPOSURE;  Surgeon: Rosetta Posner, MD;  Location: [facility] NEURO ORS;  Service: Vascular;  Laterality: N/A;   ANTERIOR LUMBAR FUSION N/A 05/09/2015   Procedure: LUMBAR FOUR-FIVE ANTERIOR LUMBAR FUSION WITH ABDOMINAL EXPOSURE;  Surgeon: Eustace Moore, MD;  Location: [place] [facility] OR[facility];  Service: Neurosurgery;  Laterality: N/A;   COLONOSCOPY W/ BIOPSIES AND POLYPECTOMY     COLONOSCOPY WITH PROPOFOL N/A 03/18/2017   Procedure: COLONOSCOPY WITH PROPOFOL;  Surgeon: [name], MD;  Location: [facility] ENDOSCOPY;  Service: Endoscopy;  Laterality: N/A;   FOOT SURGERY Right 2021   GANGLION CYST EXCISION     left leg   HERNIA REPAIR Left    inguinal at 76    TOOTH EXTRACTION      Family History  Problem Relation Age of Onset   Cancer Mother        Lung   Heart disease Mother    Cancer Father        Lung with mets to brain   Cancer Sister        Lung    No Known Allergies  Current Outpatient Medications on File Prior to Visit  Medication Sig Dispense Refill   albuterol  (VENTOLIN HFA) 108 (90 Base) MCG/ACT inhaler Inhale 2 puffs into the lungs every 6 (six) hours as needed for wheezing or shortness of breath. 8 g 0   budesonide-formoterol (SYMBICORT) 160-4.5 MCG/ACT inhaler Inhale 2 puffs into the lungs 2 (two) times daily. 1 each 5   buPROPion (ZYBAN) 150 MG 12 hr tablet TAKE ONE TABLET BY MOUTH DAILY 90 tablet 2   pravastatin (PRAVACHOL) 40 MG tablet Take 1 tablet (40 mg total) by mouth daily. for cholesterol. 90 tablet 2   tadalafil (CIALIS) 10  MG tablet TAKE ONE TABLET BY MOUTH 60 MINUTES PRIOR TO INTERCOURSE AS NEEDED 30 tablet 0   traZODone (DESYREL) 100 MG tablet Take 1 tablet (100 mg total) by mouth at bedtime. For sleep 90 tablet 2   No current facility-administered medications on file prior to visit.    BP (!) 152/78   Pulse 85   Temp (!) 97.2 F (36.2 C) (Temporal)   Ht '6\' 2"'$  (1.88 m)   Wt 139 lb (63 kg)   SpO2 99%   BMI 17.85 kg/m  Objective:   Physical Exam Cardiovascular:     Rate and Rhythm: Normal rate and regular rhythm.  Pulmonary:     Effort: Pulmonary effort is normal.     Breath sounds: Normal breath sounds. No wheezing or rales.  Musculoskeletal:     Cervical back: Neck supple.  Skin:    General: Skin is warm and dry.  Neurological:     Mental Status: He is alert and oriented to person, place, and time.     Cranial Nerves: No cranial nerve deficit.     Coordination: Coordination normal.  Psychiatric:        Behavior: Behavior normal.           Assessment & Plan:   Problem List Items Addressed This Visit       Other   Anxiety and depression    Controlled,  Reduce Cymbalta to 20 mg daily. We also discussed taking this at night.   He will update if he continues to experience insomnia.      Relevant Medications   DULoxetine (CYMBALTA) 20 MG capsule   Transient global amnesia - Primary    Recent emergency department visit,  Reviewed hospital notes, labs, imaging. Reviewed neurology notes from  consult.  Neuro exam today unremarkable. We will continue to closely monitor, wife has been monitoring.            Pleas Koch, NP

## 2022-08-10 DIAGNOSIS — H524 Presbyopia: Secondary | ICD-10-CM | POA: Diagnosis not present

## 2022-08-10 DIAGNOSIS — Z0100 Encounter for examination of eyes and vision without abnormal findings: Secondary | ICD-10-CM | POA: Diagnosis not present

## 2022-08-26 ENCOUNTER — Other Ambulatory Visit: Payer: Self-pay | Admitting: Primary Care

## 2022-08-26 DIAGNOSIS — J439 Emphysema, unspecified: Secondary | ICD-10-CM

## 2022-08-26 DIAGNOSIS — N529 Male erectile dysfunction, unspecified: Secondary | ICD-10-CM

## 2022-11-06 ENCOUNTER — Other Ambulatory Visit: Payer: Self-pay | Admitting: Primary Care

## 2022-11-06 DIAGNOSIS — F419 Anxiety disorder, unspecified: Secondary | ICD-10-CM

## 2022-11-06 NOTE — Telephone Encounter (Signed)
Patient is due for CPE/follow up in July. Please schedule, thank you!

## 2022-11-08 NOTE — Telephone Encounter (Signed)
Scheduled pt's cpe for 04/26/23

## 2022-11-16 ENCOUNTER — Ambulatory Visit (INDEPENDENT_AMBULATORY_CARE_PROVIDER_SITE_OTHER): Payer: No Typology Code available for payment source

## 2022-11-16 VITALS — Ht 72.0 in | Wt 139.0 lb

## 2022-11-16 DIAGNOSIS — Z0000 Encounter for general adult medical examination without abnormal findings: Secondary | ICD-10-CM

## 2022-11-16 NOTE — Patient Instructions (Signed)
Don Christian , Thank you for taking time to come for your Medicare Wellness Visit. I appreciate your ongoing commitment to your health goals. Please review the following plan we discussed and let me know if I can assist you in the future.   These are the goals we discussed:  Goals      Increase physical activity     Starting 03/27/2018, I will continue to exercise for at least 30 minutes daily.      Patient Stated     11/12/2020, I will maintain and continue medications as prescribed.      Patient Stated     Would like to exercise more     Patient Stated     Travel more.        This is a list of the screening recommended for you and due dates:  Health Maintenance  Topic Date Due   DTaP/Tdap/Td vaccine (1 - Tdap) Never done   Flu Shot  01/02/2023*   Pneumonia Vaccine (3 of 3 - PPSV23 or PCV20) 04/19/2023   Medicare Annual Wellness Visit  11/17/2023   Colon Cancer Screening  03/19/2027   Hepatitis C Screening: USPSTF Recommendation to screen - Ages 18-79 yo.  Completed   Zoster (Shingles) Vaccine  Completed   HPV Vaccine  Aged Out   COVID-19 Vaccine  Discontinued  *Topic was postponed. The date shown is not the original due date.    Advanced directives: Advance directive discussed with you today. Even though you declined this today, please call our office should you change your mind, and we can give you the proper paperwork for you to fill out.   Conditions/risks identified: none  Next appointment: Follow up in one year for your annual wellness visit. 11/21/2023 @ 8:00 via telephone.  Preventive Care 7 Years and Older, Male  Preventive care refers to lifestyle choices and visits with your health care provider that can promote health and wellness. What does preventive care include? A yearly physical exam. This is also called an annual well check. Dental exams once or twice a year. Routine eye exams. Ask your health care provider how often you should have your eyes  checked. Personal lifestyle choices, including: Daily care of your teeth and gums. Regular physical activity. Eating a healthy diet. Avoiding tobacco and drug use. Limiting alcohol use. Practicing safe sex. Taking low doses of aspirin every day. Taking vitamin and mineral supplements as recommended by your health care provider. What happens during an annual well check? The services and screenings done by your health care provider during your annual well check will depend on your age, overall health, lifestyle risk factors, and family history of disease. Counseling  Your health care provider may ask you questions about your: Alcohol use. Tobacco use. Drug use. Emotional well-being. Home and relationship well-being. Sexual activity. Eating habits. History of falls. Memory and ability to understand (cognition). Work and work Statistician. Screening  You may have the following tests or measurements: Height, weight, and BMI. Blood pressure. Lipid and cholesterol levels. These may be checked every 5 years, or more frequently if you are over 65 years old. Skin check. Lung cancer screening. You may have this screening every year starting at age 17 if you have a 30-pack-year history of smoking and currently smoke or have quit within the past 15 years. Fecal occult blood test (FOBT) of the stool. You may have this test every year starting at age 7. Flexible sigmoidoscopy or colonoscopy. You may have a sigmoidoscopy  every 5 years or a colonoscopy every 10 years starting at age 63. Prostate cancer screening. Recommendations will vary depending on your family history and other risks. Hepatitis C blood test. Hepatitis B blood test. Sexually transmitted disease (STD) testing. Diabetes screening. This is done by checking your blood sugar (glucose) after you have not eaten for a while (fasting). You may have this done every 1-3 years. Abdominal aortic aneurysm (AAA) screening. You may need this  if you are a current or former smoker. Osteoporosis. You may be screened starting at age 68 if you are at high risk. Talk with your health care provider about your test results, treatment options, and if necessary, the need for more tests. Vaccines  Your health care provider may recommend certain vaccines, such as: Influenza vaccine. This is recommended every year. Tetanus, diphtheria, and acellular pertussis (Tdap, Td) vaccine. You may need a Td booster every 10 years. Zoster vaccine. You may need this after age 57. Pneumococcal 13-valent conjugate (PCV13) vaccine. One dose is recommended after age 77. Pneumococcal polysaccharide (PPSV23) vaccine. One dose is recommended after age 74. Talk to your health care provider about which screenings and vaccines you need and how often you need them. This information is not intended to replace advice given to you by your health care provider. Make sure you discuss any questions you have with your health care provider. Document Released: 10/17/2015 Document Revised: 06/09/2016 Document Reviewed: 07/22/2015 Elsevier Interactive Patient Education  2017 [place] Prevention in the Home Falls can cause injuries. They can happen to people of all ages. There are many things you can do to make your home safe and to help prevent falls. What can I do on the outside of my home? Regularly fix the edges of walkways and driveways and fix any cracks. Remove anything that might make you trip as you walk through a door, such as a raised step or threshold. Trim any bushes or trees on the path to your home. Use bright outdoor lighting. Clear any walking paths of anything that might make someone trip, such as rocks or tools. Regularly check to see if handrails are loose or broken. Make sure that both sides of any steps have handrails. Any raised decks and porches should have guardrails on the edges. Have any leaves, snow, or ice cleared regularly. Use sand  or salt on walking paths during winter. Clean up any spills in your garage right away. This includes oil or grease spills. What can I do in the bathroom? Use night lights. Install grab bars by the toilet and in the tub and shower. Do not use towel bars as grab bars. Use non-skid mats or decals in the tub or shower. If you need to sit down in the shower, use a plastic, non-slip stool. Keep the floor dry. Clean up any water that spills on the floor as soon as it happens. Remove soap buildup in the tub or shower regularly. Attach bath mats securely with double-sided non-slip rug tape. Do not have throw rugs and other things on the floor that can make you trip. What can I do in the bedroom? Use night lights. Make sure that you have a light by your bed that is easy to reach. Do not use any sheets or blankets that are too big for your bed. They should not hang down onto the floor. Have a firm chair that has side arms. You can use this for support while you get dressed. Do not have  throw rugs and other things on the floor that can make you trip. What can I do in the kitchen? Clean up any spills right away. Avoid walking on wet floors. Keep items that you use a lot in easy-to-reach places. If you need to reach something above you, use a strong step stool that has a grab bar. Keep electrical cords out of the way. Do not use floor polish or wax that makes floors slippery. If you must use wax, use non-skid floor wax. Do not have throw rugs and other things on the floor that can make you trip. What can I do with my stairs? Do not leave any items on the stairs. Make sure that there are handrails on both sides of the stairs and use them. Fix handrails that are broken or loose. Make sure that handrails are as long as the stairways. Check any carpeting to make sure that it is firmly attached to the stairs. Fix any carpet that is loose or worn. Avoid having throw rugs at the top or bottom of the stairs.  If you do have throw rugs, attach them to the floor with carpet tape. Make sure that you have a light switch at the top of the stairs and the bottom of the stairs. If you do not have them, ask someone to add them for you. What else can I do to help prevent falls? Wear shoes that: Do not have high heels. Have rubber bottoms. Are comfortable and fit you well. Are closed at the toe. Do not wear sandals. If you use a stepladder: Make sure that it is fully opened. Do not climb a closed stepladder. Make sure that both sides of the stepladder are locked into place. Ask someone to hold it for you, if possible. Clearly mark and make sure that you can see: Any grab bars or handrails. First and last steps. Where the edge of each step is. Use tools that help you move around (mobility aids) if they are needed. These include: Canes. Walkers. Scooters. Crutches. Turn on the lights when you go into a dark area. Replace any light bulbs as soon as they burn out. Set up your furniture so you have a clear path. Avoid moving your furniture around. If any of your floors are uneven, fix them. If there are any pets around you, be aware of where they are. Review your medicines with your doctor. Some medicines can make you feel dizzy. This can increase your chance of falling. Ask your doctor what other things that you can do to help prevent falls. This information is not intended to replace advice given to you by your health care provider. Make sure you discuss any questions you have with your health care provider. Document Released: 07/17/2009 Document Revised: 02/26/2016 Document Reviewed: 10/25/2014 Elsevier Interactive Patient Education  2017 [employer].

## 2022-11-16 NOTE — Progress Notes (Signed)
I connected with  Don Christian on 11/16/22 by a audio enabled telemedicine application and verified that I am speaking with the correct person using two identifiers.  Patient Location: Home  Provider Location: Office/Clinic  I discussed the limitations of evaluation and management by telemedicine. The patient expressed understanding and agreed to proceed.  Subjective:   Don Christian is a 67 y.o. male who presents for Medicare Annual/Subsequent preventive examination.  Review of Systems     Cardiac Risk Factors include: advanced age (>45mn, >>84women);male gender;dyslipidemia     Objective:    Today's Vitals   11/16/22 0805 11/16/22 0807  Weight: 139 lb (63 kg)   Height: 6' (1.829 m)   PainSc:  4    Body mass index is 18.85 kg/m.     11/16/2022    8:18 AM 11/13/2021   10:41 AM 11/12/2020   11:15 AM 03/30/2019    8:06 AM 03/27/2018   12:54 PM 03/18/2017   10:33 AM 01/18/2017    8:33 AM  Advanced Directives  Does Patient Have a Medical Advance Directive? No No No No No No No  Would patient like information on creating a medical advance directive? No - Patient declined Yes (MAU/Ambulatory/Procedural Areas - Information given) No - Patient declined No - Patient declined Yes (MAU/Ambulatory/Procedural Areas - Information given)      Current Medications (verified) Outpatient Encounter Medications as of 11/16/2022  Medication Sig   albuterol (VENTOLIN HFA) 108 (90 Base) MCG/ACT inhaler INHALE 2 PUFFS BY MOUTH EVERY 6 HOURS AS NEEDED FOR WHEEZING OR SHORTNESS OF BREATH   budesonide-formoterol (SYMBICORT) 160-4.5 MCG/ACT inhaler Inhale 2 puffs into the lungs 2 (two) times daily.   buPROPion (ZYBAN) 150 MG 12 hr tablet TAKE ONE TABLET BY MOUTH DAILY   DULoxetine (CYMBALTA) 20 MG capsule TAKE 1 CAPSULE BY MOUTH DAILY FOR ANXIETY AND DEPRESSION   pravastatin (PRAVACHOL) 40 MG tablet Take 1 tablet (40 mg total) by mouth daily. for cholesterol.   tadalafil (CIALIS) 10 MG tablet TAKE 1  TABLET BY MOUTH 60 MINUTES PRIOR TO INTERCOURSE AS NEEDED   traZODone (DESYREL) 100 MG tablet Take 1 tablet (100 mg total) by mouth at bedtime. For sleep   No facility-administered encounter medications on file as of 11/16/2022.    Allergies (verified) Patient has no known allergies.   History: Past Medical History:  Diagnosis Date   BPH (benign prostatic hyperplasia) 09/16/2014   Cataract    left eye   Chronic back pain 09/16/2014   Colon polyps    COPD (chronic obstructive pulmonary disease) (HCC)    mild   DDD (degenerative disc disease), lumbar 09/16/2014   Full dentures    Inguinal hernia 2004   Insomnia 09/16/2014   Personal history of tobacco use, presenting hazards to health 03/10/2016   Prostate disorder    S/P lumbar spinal fusion 05/09/2015   Sleeping difficulties    Tobacco use disorder 09/16/2014   Varicella zoster 11/19/2015   Past Surgical History:  Procedure Laterality Date   ABDOMINAL EXPOSURE N/A 05/09/2015   Procedure: ABDOMINAL EXPOSURE;  Surgeon: TRosetta Posner MD;  Location: [facility];  Service: Vascular;  Laterality: N/A;   ANTERIOR LUMBAR FUSION N/A 05/09/2015   Procedure: LUMBAR FOUR-FIVE ANTERIOR LUMBAR FUSION WITH ABDOMINAL EXPOSURE;  Surgeon: DEustace Moore MD;  [facility];  Service: Neurosurgery;  Laterality: N/A;   COLONOSCOPY W/ BIOPSIES AND POLYPECTOMY     COLONOSCOPY WITH PROPOFOL N/A 03/18/2017   Procedure: COLONOSCOPY WITH PROPOFOL;  Surgeon: [name], MD;  Location: [facility] ENDOSCOPY;  Service: Endoscopy;  Laterality: N/A;   FOOT SURGERY Right 2021   GANGLION CYST EXCISION     left leg   HERNIA REPAIR Left    inguinal at 24    TOOTH EXTRACTION     Family History  Problem Relation Age of Onset   Cancer Mother        Lung   Heart disease Mother    Cancer Father        Lung with mets to brain   Cancer Sister        Lung   Social History   Socioeconomic History   Marital status: Married    Spouse name: Not on file    Number of children: Not on file   Years of education: Not on file   Highest education level: Not on file  Occupational History   Not on file  Tobacco Use   Smoking status: Former    Types: Cigars    Quit date: 11/04/2018    Years since quitting: 4.0    Passive exposure: Past   Smokeless tobacco: Never  Vaping Use   Vaping Use: Never used  Substance and Sexual Activity   Alcohol use: Not Currently    Comment: occassionally on social occassions   Drug use: No   Sexual activity: Yes  Other Topics Concern   Not on file  Social History Narrative   ** Merged History Encounter **       Work or School: no work currently, disabled from back injury in 2012 Home Situation: lives with wife Spiritual Beliefs: none Lifestyle: walks dog 2x per day;  Healthy diet per his report. Enjoys spending time outdoors.      Social Determinants of Health   Financial Resource Strain: Low Risk  (11/16/2022)   Overall Financial Resource Strain (CARDIA)    Difficulty of Paying Living Expenses: Not hard at all  Food Insecurity: No Food Insecurity (11/16/2022)   Hunger Vital Sign    Worried About Running Out of Food in the Last Year: Never true    Ran Out of Food in the Last Year: Never true  Transportation Needs: No Transportation Needs (11/16/2022)   PRAPARE - Hydrologist (Medical): No    Lack of Transportation (Non-Medical): No  Physical Activity: Sufficiently Active (11/16/2022)   Exercise Vital Sign    Days of Exercise per Week: 7 days    Minutes of Exercise per Session: 30 min  Stress: No Stress Concern Present (11/16/2022)   Page    Feeling of Stress : Not at all  Social Connections: Moderately Isolated (11/16/2022)   Social Connection and Isolation Panel [NHANES]    Frequency of Communication with Friends and Family: Once a week    Frequency of Social Gatherings with Friends and Family: More  than three times a week    Attends Religious Services: Never    Marine scientist or Organizations: No    Attends Music therapist: Never    Marital Status: Married    Tobacco Counseling Counseling given: Not Answered   Clinical Intake:  Pre-visit preparation completed: Yes  Pain : 0-10 Pain Score: 4  Pain Type: Chronic pain Pain Location: Back (Back since 2010, Abdomen since 2016) Pain Orientation: Lower (had hernia surgery) Pain Radiating Towards: Buttock Pain Descriptors / Indicators: Aching Pain Onset: Other (comment) (years) Pain Frequency: Constant Pain  Relieving Factors: ice helps Effect of Pain on Daily Activities: Doesn't interfere with daily activities  Pain Relieving Factors: ice helps  Nutritional Risks: None Diabetes: No  How often do you need to have someone help you when you read instructions, pamphlets, or other written materials from your doctor or pharmacy?: 1 - Never  Diabetic?no  Interpreter Needed?: No  Information entered by :: C.Mariyanna Mucha   Activities of Daily Living    11/16/2022    8:18 AM 11/15/2022   10:17 AM  In your present state of health, do you have any difficulty performing the following activities:  Hearing? 0 0  Vision? 0 0  Difficulty concentrating or making decisions? 0 0  Walking or climbing stairs? 0 0  Dressing or bathing? 0 0  Doing errands, shopping? 0 0  Preparing Food and eating ? N N  Using the Toilet? N N  In the past six months, have you accidently leaked urine? N N  Do you have problems with loss of bowel control? N N  Managing your Medications? N N  Managing your Finances? N N  Housekeeping or managing your Housekeeping? N N    Patient Care Team: Pleas Koch, NP as PCP - General (Internal Medicine) Pleas Koch, NP (Nurse Practitioner) Bary Castilla Forest Gleason, MD (General Surgery)  Indicate any recent Medical Services you may have received from other than [facility] providers in the  past year (date may be approximate).     Assessment:   This is a routine wellness examination for Don Christian.  Hearing/Vision screen Hearing Screening - Comments:: No aid Vision Screening - Comments:: Wears glasses- [facility]  Dietary issues and exercise activities discussed: Current Exercise Habits: Home exercise routine, Type of exercise: walking, Time (Minutes): 30, Frequency (Times/Week): 7, Weekly Exercise (Minutes/Week): 210, Intensity: Mild, Exercise limited by: None identified   Goals Addressed             This Visit's Progress    Patient Stated       Travel more.       Depression Screen    11/16/2022    8:15 AM 04/22/2022   11:58 AM 11/13/2021   10:43 AM 04/08/2021    7:40 AM 11/12/2020   11:16 AM 03/30/2019    8:07 AM 03/27/2018   12:53 PM  PHQ 2/9 Scores  PHQ - 2 Score 0 2 0 0 0 0 0  PHQ- 9 Score  3  0 0 0 0    Fall Risk    11/16/2022    8:12 AM 11/15/2022   10:17 AM 11/13/2021   10:42 AM 04/08/2021    7:40 AM 11/12/2020   11:16 AM  Fall Risk   Falls in the past year? 0 0 0 0 0  Number falls in past yr: 0 0 0 0 0  Injury with Fall? 0  0 0 0  Risk for fall due to : No Fall Risks  No Fall Risks  No Fall Risks  Follow up Falls prevention discussed;Falls evaluation completed  Falls prevention discussed  Falls evaluation completed;Falls prevention discussed    FALL RISK PREVENTION PERTAINING TO THE HOME:  Any stairs in or around the home? Yes  If so, are there any without handrails? Yes  Home free of loose throw rugs in walkways, pet beds, electrical cords, etc? Yes  Adequate lighting in your home to reduce risk of falls? Yes   ASSISTIVE DEVICES UTILIZED TO PREVENT FALLS:  Life alert? No  Use of a  cane, walker or w/c? No  Grab bars in the bathroom? Yes  Shower chair or bench in shower? Yes  Elevated toilet seat or a handicapped toilet? No   Cognitive Function:    11/12/2020   11:19 AM 03/30/2019    8:07 AM 03/27/2018   12:53 PM 01/18/2017    8:49 AM   MMSE - Mini Mental State Exam  Not completed: Refused     Orientation to time  5 5 5  $ Orientation to Place  5 5 5  $ Registration  3 3 3  $ Attention/ Calculation  0 0 0  Recall  3 3 3  $ Language- name 2 objects  0 0 0  Language- repeat  1 1 1  $ Language- follow 3 step command  0 3 3  Language- read & follow direction  0 0 0  Write a sentence  0 0 0  Copy design  0 0 0  Total score  17 20 20        $ 11/16/2022    8:26 AM  6CIT Screen  What Year? 0 points  What month? 0 points  What time? 0 points  Count back from 20 0 points  Months in reverse 0 points  Repeat phrase 0 points  Total Score 0 points    Immunizations Immunization History  Administered Date(s) Administered   Influenza Inj Mdck Quad Pf 07/27/2018   Influenza,inj,Quad PF,6+ Mos 09/16/2014, 08/11/2015, 07/12/2017, 06/19/2019   Influenza-Unspecified 07/04/2020, 07/14/2021   PFIZER(Purple Top)SARS-COV-2 Vaccination 01/03/2020, 01/28/2020, 08/02/2020, 02/12/2021   Pfizer Covid-19 Vaccine Bivalent Booster 60yr & up 07/14/2021   Pneumococcal Conjugate-13 04/04/2020   Pneumococcal Polysaccharide-23 04/18/2018   Zoster Recombinat (Shingrix) 09/03/2016, 01/29/2017    TDAP status: Due, Education has been provided regarding the importance of this vaccine. Advised may receive this vaccine at local pharmacy or Health Dept. Aware to provide a copy of the vaccination record if obtained from local pharmacy or Health Dept. Verbalized acceptance and understanding.  Flu Vaccine status: Up to date , [facility]   Pneumococcal vaccine status: Up to date  Covid-19 vaccine status: Completed vaccines  Qualifies for Shingles Vaccine? No   Zostavax completed No   Shingrix Completed?: Yes  Screening Tests Health Maintenance  Topic Date Due   DTaP/Tdap/Td (1 - Tdap) Never done   INFLUENZA VACCINE  01/02/2023 (Originally 05/04/2022)   Pneumonia Vaccine 68 Years old (3 of 3 - PPSV23 or PCV20) 04/19/2023   Medicare Annual  Wellness (AWV)  11/17/2023   COLONOSCOPY (Pts 45-481yrInsurance coverage will need to be confirmed)  03/19/2027   Hepatitis C Screening  Completed   Zoster Vaccines- Shingrix  Completed   HPV VACCINES  Aged Out   COVID-19 Vaccine  Discontinued    Health Maintenance  Health Maintenance Due  Topic Date Due   DTaP/Tdap/Td (1 - Tdap) Never done    Colorectal cancer screening: Type of screening: Colonoscopy. Completed 03/18/2017. Repeat every 10 years  Lung Cancer Screening: (Low Dose CT Chest recommended if Age 68-80ears, 30 pack-year currently smoking OR have quit w/in 15years.) does not qualify.   Lung Cancer Screening Referral: no  Additional Screening:  Hepatitis C Screening: does not qualify; Completed 01/22/2016  Vision Screening: Recommended annual ophthalmology exams for early detection of glaucoma and other disorders of the eye. Is the patient up to date with their annual eye exam?  Yes  Who is the provider or what is the name of the office in which the patient attends annual  eye exams? [facility] If pt is not established with a provider, would they like to be referred to a provider to establish care? No .   Dental Screening: Recommended annual dental exams for proper oral hygiene  Community Resource Referral / Chronic Care Management: CRR required this visit?  No   CCM required this visit?  No      Plan:     I have personally reviewed and noted the following in the patient's chart:   Medical and social history Use of alcohol, tobacco or illicit drugs  Current medications and supplements including opioid prescriptions. Patient is not currently taking opioid prescriptions. Functional ability and status Nutritional status Physical activity Advanced directives List of other physicians Hospitalizations, surgeries, and ER visits in previous 12 months Vitals Screenings to include cognitive, depression, and falls Referrals and appointments  In  addition, I have reviewed and discussed with patient certain preventive protocols, quality metrics, and best practice recommendations. A written personalized care plan for preventive services as well as general preventive health recommendations were provided to patient.     Don Conners, LPN   [id number]   Nurse Notes: none

## 2022-12-27 ENCOUNTER — Other Ambulatory Visit: Payer: Self-pay | Admitting: Primary Care

## 2022-12-27 DIAGNOSIS — N529 Male erectile dysfunction, unspecified: Secondary | ICD-10-CM

## 2022-12-27 DIAGNOSIS — J439 Emphysema, unspecified: Secondary | ICD-10-CM

## 2023-03-15 ENCOUNTER — Other Ambulatory Visit: Payer: Self-pay | Admitting: Acute Care

## 2023-03-15 DIAGNOSIS — Z122 Encounter for screening for malignant neoplasm of respiratory organs: Secondary | ICD-10-CM

## 2023-03-15 DIAGNOSIS — Z87891 Personal history of nicotine dependence: Secondary | ICD-10-CM

## 2023-03-15 DIAGNOSIS — F17210 Nicotine dependence, cigarettes, uncomplicated: Secondary | ICD-10-CM

## 2023-03-23 ENCOUNTER — Other Ambulatory Visit: Payer: Self-pay | Admitting: Primary Care

## 2023-03-23 DIAGNOSIS — G4700 Insomnia, unspecified: Secondary | ICD-10-CM

## 2023-03-23 DIAGNOSIS — F17200 Nicotine dependence, unspecified, uncomplicated: Secondary | ICD-10-CM

## 2023-03-29 ENCOUNTER — Telehealth: Payer: Self-pay | Admitting: Primary Care

## 2023-03-29 DIAGNOSIS — E782 Mixed hyperlipidemia: Secondary | ICD-10-CM

## 2023-03-29 DIAGNOSIS — I700 Atherosclerosis of aorta: Secondary | ICD-10-CM

## 2023-03-29 MED ORDER — PRAVASTATIN SODIUM 40 MG PO TABS: 40.0000 mg | ORAL_TABLET | Freq: Every day | ORAL | 0 refills | Status: DC

## 2023-03-29 NOTE — Telephone Encounter (Signed)
Prescription Request  03/29/2023  LOV: 08/09/2022  What is the name of the medication or equipment? pravastatin (PRAVACHOL) 40 MG tablet [[id number]]   Have you contacted your pharmacy to request a refill? No   Which pharmacy would you like this sent to?  [id number] [place], [place] - [address] [place] [address] Phone: [phone] Fax: [phone]    Patient notified that their request is being sent to the clinical staff for review and that they should receive a response within 2 business days.   Please advise at [phone]  [facility] health called on the behalf of pt.

## 2023-03-29 NOTE — Addendum Note (Signed)
Addended by: Doreene Nest on: 03/29/2023 06:29 PM   Modules accepted: Orders

## 2023-03-29 NOTE — Telephone Encounter (Signed)
Noted. Refill(s) sent to pharmacy.  

## 2023-04-26 ENCOUNTER — Encounter: Payer: No Typology Code available for payment source | Admitting: Primary Care

## 2023-05-05 ENCOUNTER — Encounter: Payer: Self-pay | Admitting: Primary Care

## 2023-05-05 ENCOUNTER — Ambulatory Visit: Payer: No Typology Code available for payment source | Admitting: Primary Care

## 2023-05-05 VITALS — BP 124/78 | HR 55 | Temp 97.5°F | Ht 72.0 in | Wt 138.0 lb

## 2023-05-05 DIAGNOSIS — K5900 Constipation, unspecified: Secondary | ICD-10-CM

## 2023-05-05 DIAGNOSIS — M5136 Other intervertebral disc degeneration, lumbar region: Secondary | ICD-10-CM | POA: Diagnosis not present

## 2023-05-05 DIAGNOSIS — R7303 Prediabetes: Secondary | ICD-10-CM | POA: Diagnosis not present

## 2023-05-05 DIAGNOSIS — Z125 Encounter for screening for malignant neoplasm of prostate: Secondary | ICD-10-CM | POA: Diagnosis not present

## 2023-05-05 DIAGNOSIS — N401 Enlarged prostate with lower urinary tract symptoms: Secondary | ICD-10-CM | POA: Diagnosis not present

## 2023-05-05 DIAGNOSIS — R3912 Poor urinary stream: Secondary | ICD-10-CM

## 2023-05-05 DIAGNOSIS — N529 Male erectile dysfunction, unspecified: Secondary | ICD-10-CM

## 2023-05-05 DIAGNOSIS — F419 Anxiety disorder, unspecified: Secondary | ICD-10-CM

## 2023-05-05 DIAGNOSIS — E782 Mixed hyperlipidemia: Secondary | ICD-10-CM

## 2023-05-05 DIAGNOSIS — Z0000 Encounter for general adult medical examination without abnormal findings: Secondary | ICD-10-CM | POA: Diagnosis not present

## 2023-05-05 DIAGNOSIS — F32A Depression, unspecified: Secondary | ICD-10-CM | POA: Diagnosis not present

## 2023-05-05 DIAGNOSIS — M51369 Other intervertebral disc degeneration, lumbar region without mention of lumbar back pain or lower extremity pain: Secondary | ICD-10-CM

## 2023-05-05 DIAGNOSIS — G4700 Insomnia, unspecified: Secondary | ICD-10-CM

## 2023-05-05 DIAGNOSIS — F17200 Nicotine dependence, unspecified, uncomplicated: Secondary | ICD-10-CM

## 2023-05-05 DIAGNOSIS — J439 Emphysema, unspecified: Secondary | ICD-10-CM

## 2023-05-05 LAB — COMPREHENSIVE METABOLIC PANEL
ALT: 12 U/L (ref 0–53)
AST: 15 U/L (ref 0–37)
Albumin: 4.4 g/dL (ref 3.5–5.2)
Alkaline Phosphatase: 70 U/L (ref 39–117)
BUN: 13 mg/dL (ref 6–23)
CO2: 31 mEq/L (ref 19–32)
Calcium: 9.4 mg/dL (ref 8.4–10.5)
Chloride: 99 mEq/L (ref 96–112)
Creatinine, Ser: 0.85 mg/dL (ref 0.40–1.50)
GFR: 89.48 mL/min (ref >60.00)
Glucose, Bld: 89 mg/dL (ref 70–99)
Potassium: 4.1 mEq/L (ref 3.5–5.1)
Sodium: 136 mEq/L (ref 135–145)
Total Bilirubin: 0.7 mg/dL (ref 0.2–1.2)
Total Protein: 6.7 g/dL (ref 6.0–8.3)

## 2023-05-05 LAB — LIPID PANEL
Cholesterol: 152 mg/dL (ref 0–200)
HDL: 60.8 mg/dL (ref >39.00)
LDL Cholesterol: 80 mg/dL (ref 0–99)
NonHDL: 91.28
Total CHOL/HDL Ratio: 3
Triglycerides: 55 mg/dL (ref 0.0–149.0)
VLDL: 11 mg/dL (ref 0.0–40.0)

## 2023-05-05 LAB — PSA, MEDICARE: PSA: 3.14 ng/ml (ref 0.10–4.00)

## 2023-05-05 LAB — HEMOGLOBIN A1C: Hgb A1c MFr Bld: 5.7 % (ref 4.6–6.5)

## 2023-05-05 MED ORDER — BUDESONIDE-FORMOTEROL FUMARATE 160-4.5 MCG/ACT IN AERO: 2.0000 | INHALATION_SPRAY | Freq: Two times a day (BID) | RESPIRATORY_TRACT | 3 refills | Status: DC

## 2023-05-05 NOTE — Assessment & Plan Note (Signed)
Overall stable.  Continue Symbicort 160-4.5 mcg, 2 puffs BID. Continue albuterol inhaler for which he uses infrequently.  Lung cancer screening pending.

## 2023-05-05 NOTE — Assessment & Plan Note (Signed)
Immunizations UTD. Colonoscopy UTD, due 2028 PSA due and pending.  Discussed the importance of a healthy diet and regular exercise in order for weight loss, and to reduce the risk of further co-morbidity.  Exam stable. Labs pending.  Follow up in 1 year for repeat physical.

## 2023-05-05 NOTE — Assessment & Plan Note (Signed)
Chronic, progressing.   Offered further work up including x-ray, physical therapy, referral to orthopedics for which she kindly declines.  Discussed to start Tylenol arthritis, 500 to 1000 mg once to twice daily. If no improvement then consider meloxicam 15 mg daily.  He will update.

## 2023-05-05 NOTE — Assessment & Plan Note (Signed)
Overall controlled per patient.  Continue bupropion SR 150 mg daily, Cymbalta 20 mg daily.

## 2023-05-05 NOTE — Assessment & Plan Note (Signed)
Lung cancer screening scheduled for September 2024.

## 2023-05-05 NOTE — Assessment & Plan Note (Signed)
Controlled.  Continue trazodone 100 mg at bedtime.

## 2023-05-05 NOTE — Patient Instructions (Signed)
Stop by the lab prior to leaving today. I will notify you of your results once received.   It was a pleasure to see you today!  

## 2023-05-05 NOTE — Assessment & Plan Note (Signed)
Repeat A1c pending. 

## 2023-05-05 NOTE — Assessment & Plan Note (Signed)
No concerns today, continue to monitor.

## 2023-05-05 NOTE — Assessment & Plan Note (Addendum)
Chronic and continued.  Cannot take tamsulosin due to orthostatic hypotension.  Offered a trial of finasteride 5 mg daily or low-dose Cialis daily for which he kindly declines. Will continue to monitor.

## 2023-05-05 NOTE — Assessment & Plan Note (Signed)
Controlled. Cialis 10 mg as needed.

## 2023-05-05 NOTE — Progress Notes (Signed)
Subjective:    Patient ID: Don Christian, male    DOB: [birth year], 67 y.o.   MRN: [id number]  HPI  Don Christian is a very pleasant 67 y.o. male who presents today for complete physical and follow up of chronic conditions.  He continues to experience bilateral lower back pain and pain to bilateral SI joints. Progressing. His pain is constant with intermittent shooting pain. History of lumbar disc fusion years ago. He denies radiation of pain and numbness to his lower extremities. He takes aspirin on occasion for his pain which doesn't help much.  He does not take Tylenol or ibuprofen.  He continues to experience difficulty initiating a urinary stream, weak urinary stream which occurs daily. He was once managed on tamsulosin daily, but had to discontinue due to orthostatic hypotension.  Immunizations: -Shingles: Completed Shingrix series -Pneumonia: Completed Prevnar 13 in 2021, Pneumovax 23 in 2019  Diet: Fair diet.  Exercise: No regular exercise.  Eye exam: Completes annually  Dental exam: Completes semi-annually    Colonoscopy: Completed in 2018, due 2028 Lung Cancer Screening: Completed in August 2023, scheduled for September 2023  PSA: Due   BP Readings from Last 3 Encounters:  05/05/23 124/78  08/09/22 (!) 152/78  08/03/22 127/88        Review of Systems  Constitutional:  Negative for unexpected weight change.  HENT:  Negative for rhinorrhea.   Respiratory:  Negative for cough and shortness of breath.   Cardiovascular:  Negative for chest pain.  Gastrointestinal:  Negative for constipation and diarrhea.  Genitourinary:  Positive for difficulty urinating.  Musculoskeletal:  Positive for arthralgias.  Skin:  Negative for rash.  Allergic/Immunologic: Negative for environmental allergies.  Neurological:  Negative for dizziness, numbness and headaches.  Psychiatric/Behavioral:  The patient is not nervous/anxious.          Past Medical History:  Diagnosis  Date   BPH (benign prostatic hyperplasia) 09/16/2014   Cataract    left eye   Chronic back pain 09/16/2014   Colon polyps    COPD (chronic obstructive pulmonary disease) (HCC)    mild   DDD (degenerative disc disease), lumbar 09/16/2014   Full dentures    Inguinal hernia 2004   Insomnia 09/16/2014   Personal history of tobacco use, presenting hazards to health 03/10/2016   Prostate disorder    S/P lumbar spinal fusion 05/09/2015   Sleeping difficulties    Tobacco use disorder 09/16/2014   Varicella zoster 11/19/2015    Social History   Socioeconomic History   Marital status: Married    Spouse name: Not on file   Number of children: Not on file   Years of education: Not on file   Highest education level: Not on file  Occupational History   Not on file  Tobacco Use   Smoking status: Former    Types: Cigars    Quit date: 11/04/2018    Years since quitting: 4.5    Passive exposure: Past   Smokeless tobacco: Never  Vaping Use   Vaping status: Never Used  Substance and Sexual Activity   Alcohol use: Not Currently    Comment: occassionally on social occassions   Drug use: No   Sexual activity: Yes  Other Topics Concern   Not on file  Social History Narrative   ** Merged History Encounter **       Work or School: no work currently, disabled from back injury in 2012 Home Situation: lives with wife Spiritual Beliefs: none Lifestyle: walks  dog 2x per day;  Healthy diet per his report. Enjoys spending time outdoors.      Social Determinants of Health   Financial Resource Strain: Low Risk  (11/16/2022)   Overall Financial Resource Strain (CARDIA)    Difficulty of Paying Living Expenses: Not hard at all  Food Insecurity: No Food Insecurity (11/16/2022)   Hunger Vital Sign    Worried About Running Out of Food in the Last Year: Never true    Ran Out of Food in the Last Year: Never true  Transportation Needs: No Transportation Needs (11/16/2022)   PRAPARE - Therapist, art (Medical): No    Lack of Transportation (Non-Medical): No  Physical Activity: Sufficiently Active (11/16/2022)   Exercise Vital Sign    Days of Exercise per Week: 7 days    Minutes of Exercise per Session: 30 min  Stress: No Stress Concern Present (11/16/2022)   [employer] of Occupational Health - Occupational Stress Questionnaire    Feeling of Stress : Not at all  Social Connections: Moderately Isolated (11/16/2022)   Social Connection and Isolation Panel [NHANES]    Frequency of Communication with Friends and Family: Once a week    Frequency of Social Gatherings with Friends and Family: More than three times a week    Attends Religious Services: Never    Database administrator or Organizations: No    Attends Banker Meetings: Never    Marital Status: Married  Catering manager Violence: Not At Risk (11/16/2022)   Humiliation, Afraid, Rape, and Kick questionnaire    Fear of Current or Ex-Partner: No    Emotionally Abused: No    Physically Abused: No    Sexually Abused: No    Past Surgical History:  Procedure Laterality Date   ABDOMINAL EXPOSURE N/A 05/09/2015   Procedure: ABDOMINAL EXPOSURE;  Surgeon: Larina Earthly, MD;  Location: [facility];  Service: Vascular;  Laterality: N/A;   ANTERIOR LUMBAR FUSION N/A 05/09/2015   Procedure: LUMBAR FOUR-FIVE ANTERIOR LUMBAR FUSION WITH ABDOMINAL EXPOSURE;  Surgeon: Tia Alert, MD;  Location: [facility];  Service: Neurosurgery;  Laterality: N/A;   COLONOSCOPY W/ BIOPSIES AND POLYPECTOMY     COLONOSCOPY WITH PROPOFOL N/A 03/18/2017   Procedure: COLONOSCOPY WITH PROPOFOL;  Surgeon: Wyline Mood, MD;  Location: [facility];  Service: Endoscopy;  Laterality: N/A;   FOOT SURGERY Right 2021   GANGLION CYST EXCISION     left leg   HERNIA REPAIR Left    inguinal at 14    TOOTH EXTRACTION      Family History  Problem Relation Age of Onset   Cancer Mother        Lung   Heart disease Mother    Cancer  Father        Lung with mets to brain   Cancer Sister        Lung    No Known Allergies  Current Outpatient Medications on File Prior to Visit  Medication Sig Dispense Refill   albuterol (VENTOLIN HFA) 108 (90 Base) MCG/ACT inhaler INHALE 2 PUFFS BY MOUTH EVERY 6 HOURS AS NEEDED FOR WHEEZING OR SHORTNESS OF BREATH 8.5 g 0   buPROPion (ZYBAN) 150 MG 12 hr tablet TAKE 1 TABLET BY MOUTH DAILY 90 tablet 0   DULoxetine (CYMBALTA) 20 MG capsule TAKE 1 CAPSULE BY MOUTH DAILY FOR ANXIETY AND DEPRESSION 90 capsule 1   pravastatin (PRAVACHOL) 40 MG tablet Take 1 tablet (40 mg  total) by mouth daily. for cholesterol. 90 tablet 0   tadalafil (CIALIS) 10 MG tablet TAKE 1 TABLET BY MOUTH 60 MINUTES PRIOR TO INTERCOURSE AS NEEDED 30 tablet 0   traZODone (DESYREL) 100 MG tablet TAKE ONE TABLET BY MOUTH EVERY NIGHT AT BEDTIME FOR SLEEP 90 tablet 0   No current facility-administered medications on file prior to visit.    BP 124/78   Pulse (!) 55   Temp (!) 97.5 F (36.4 C) (Temporal)   Ht 6' (1.829 m)   Wt 138 lb (62.6 kg)   SpO2 99%   BMI 18.72 kg/m  Objective:   Physical Exam HENT:     Right Ear: Tympanic membrane and ear canal normal.     Left Ear: Tympanic membrane and ear canal normal.     Nose: Nose normal.     Right Sinus: No maxillary sinus tenderness or frontal sinus tenderness.     Left Sinus: No maxillary sinus tenderness or frontal sinus tenderness.  Eyes:     Conjunctiva/sclera: Conjunctivae normal.  Neck:     Thyroid: No thyromegaly.     Vascular: No carotid bruit.  Cardiovascular:     Rate and Rhythm: Normal rate and regular rhythm.     Heart sounds: Normal heart sounds.  Pulmonary:     Effort: Pulmonary effort is normal.     Breath sounds: Normal breath sounds. No wheezing or rales.  Abdominal:     General: Bowel sounds are normal.     Palpations: Abdomen is soft.     Tenderness: There is no abdominal tenderness.  Musculoskeletal:        General: Normal range of  motion.     Cervical back: Neck supple.  Skin:    General: Skin is warm and dry.  Neurological:     Mental Status: He is alert and oriented to person, place, and time.     Cranial Nerves: No cranial nerve deficit.     Deep Tendon Reflexes: Reflexes are normal and symmetric.  Psychiatric:        Mood and Affect: Mood normal.           Assessment & Plan:  Preventative health care Assessment & Plan: Immunizations UTD. Colonoscopy UTD, due 2028 PSA due and pending.  Discussed the importance of a healthy diet and regular exercise in order for weight loss, and to reduce the risk of further co-morbidity.  Exam stable. Labs pending.  Follow up in 1 year for repeat physical.    Pulmonary emphysema, unspecified emphysema type (HCC) Assessment & Plan: Overall stable.  Continue Symbicort 160-4.5 mcg, 2 puffs BID. Continue albuterol inhaler for which he uses infrequently.  Lung cancer screening pending.  Orders: -     Budesonide-Formoterol Fumarate; Inhale 2 puffs into the lungs 2 (two) times daily.  Dispense: 3 each; Refill: 3  DDD (degenerative disc disease), lumbar Assessment & Plan: Chronic, progressing.   Offered further work up including x-ray, physical therapy, referral to orthopedics for which she kindly declines.  Discussed to start Tylenol arthritis, 500 to 1000 mg once to twice daily. If no improvement then consider meloxicam 15 mg daily.  He will update.   Benign prostatic hyperplasia with weak urinary stream Assessment & Plan: Chronic and continued.  Cannot take tamsulosin due to orthostatic hypotension.  Offered a trial of finasteride 5 mg daily or low-dose Cialis daily for which he kindly declines. Will continue to monitor.    Anxiety and depression Assessment & Plan: Overall controlled  per patient.  Continue bupropion SR 150 mg daily, Cymbalta 20 mg daily.   Constipation, unspecified constipation type Assessment & Plan: No concerns  today, continue to monitor.   Prediabetes Assessment & Plan: Repeat A1c pending.  Orders: -     Hemoglobin A1c  Insomnia, unspecified type Assessment & Plan: Controlled.  Continue trazodone 100 mg at bedtime.   Mixed hyperlipidemia Assessment & Plan: Repeat lipid panel pending.  Continue pravastatin 40 mg daily.  Orders: -     Lipid panel -     Comprehensive metabolic panel  Erectile dysfunction, unspecified erectile dysfunction type Assessment & Plan: Controlled. Cialis 10 mg as needed.   Tobacco use disorder Assessment & Plan: Lung cancer screening scheduled for September 2024.   Screening for prostate cancer -     PSA, Medicare        Doreene Nest, NP

## 2023-05-05 NOTE — Assessment & Plan Note (Signed)
Repeat lipid panel pending. Continue pravastatin 40 mg daily. 

## 2023-05-11 ENCOUNTER — Other Ambulatory Visit (HOSPITAL_COMMUNITY): Payer: Self-pay

## 2023-05-11 ENCOUNTER — Telehealth: Payer: Self-pay | Admitting: Primary Care

## 2023-05-11 ENCOUNTER — Telehealth: Payer: Self-pay | Admitting: Pharmacy Technician

## 2023-05-11 DIAGNOSIS — J439 Emphysema, unspecified: Secondary | ICD-10-CM

## 2023-05-11 MED ORDER — BUDESONIDE-FORMOTEROL FUMARATE 160-4.5 MCG/ACT IN AERO: 2.0000 | INHALATION_SPRAY | Freq: Two times a day (BID) | RESPIRATORY_TRACT | 3 refills | Status: DC

## 2023-05-11 NOTE — Telephone Encounter (Signed)
Patient called in stating his medication budesonide-formoterol (SYMBICORT) 160-4.5 MCG/ACT inhaler , stated if he was t pick this medication up from [facility] it would be over $1000 for 3 month supply. He was able to find it at a cheaper cost at [facility] for $97 for 3 months. Patient wanted to know if this could be re sent to this pharmacy. I have added pharmacy onto patient's preferred pharmacies

## 2023-05-11 NOTE — Telephone Encounter (Signed)
Noted. Rx sent to [facility] pharmacy in [place].

## 2023-05-11 NOTE — Telephone Encounter (Signed)
Pharmacy Patient Advocate Encounter   Received notification from CoverMyMeds that prior authorization for symbicort 160-4.5 mg aerosol is required/requested.   Insurance verification completed.   The patient is insured through  [employer]  .   Per test claim: PA required; PA submitted to [employer] via CoverMyMeds Key/confirmation #/EOC [id number] Status is pending

## 2023-05-12 NOTE — Telephone Encounter (Signed)
Pharmacy Patient Advocate Encounter  Received notification from  [employer] [employer]  that Prior Authorization for Symbicort has been APPROVED from 02/10/23 to 05/10/24

## 2023-06-07 ENCOUNTER — Ambulatory Visit
Admission: RE | Admit: 2023-06-07 | Discharge: 2023-06-07 | Disposition: A | Discharge status: Home / Self Care | Payer: No Typology Code available for payment source | Source: Ambulatory Visit | Attending: Acute Care | Admitting: Acute Care

## 2023-06-07 DIAGNOSIS — F17210 Nicotine dependence, cigarettes, uncomplicated: Secondary | ICD-10-CM | POA: Insufficient documentation

## 2023-06-07 DIAGNOSIS — Z122 Encounter for screening for malignant neoplasm of respiratory organs: Secondary | ICD-10-CM | POA: Diagnosis not present

## 2023-06-07 DIAGNOSIS — Z87891 Personal history of nicotine dependence: Secondary | ICD-10-CM

## 2023-06-10 DIAGNOSIS — Z0100 Encounter for examination of eyes and vision without abnormal findings: Secondary | ICD-10-CM | POA: Diagnosis not present

## 2023-06-17 ENCOUNTER — Other Ambulatory Visit: Payer: Self-pay | Admitting: Acute Care

## 2023-06-17 DIAGNOSIS — F17210 Nicotine dependence, cigarettes, uncomplicated: Secondary | ICD-10-CM

## 2023-06-17 DIAGNOSIS — Z87891 Personal history of nicotine dependence: Secondary | ICD-10-CM

## 2023-06-17 DIAGNOSIS — Z122 Encounter for screening for malignant neoplasm of respiratory organs: Secondary | ICD-10-CM

## 2023-06-17 NOTE — Telephone Encounter (Signed)
Hi Sarah!  Please see the message below from the patient.  He's concerned that this CT scan is not his. Can you take a look?  Mayra Reel, NP-C

## 2023-06-19 ENCOUNTER — Other Ambulatory Visit: Payer: Self-pay | Admitting: Primary Care

## 2023-06-19 DIAGNOSIS — G4700 Insomnia, unspecified: Secondary | ICD-10-CM

## 2023-07-01 ENCOUNTER — Other Ambulatory Visit: Payer: Self-pay | Admitting: Primary Care

## 2023-07-01 DIAGNOSIS — E782 Mixed hyperlipidemia: Secondary | ICD-10-CM

## 2023-07-01 DIAGNOSIS — F17200 Nicotine dependence, unspecified, uncomplicated: Secondary | ICD-10-CM

## 2023-07-01 DIAGNOSIS — I700 Atherosclerosis of aorta: Secondary | ICD-10-CM

## 2023-08-27 ENCOUNTER — Other Ambulatory Visit: Payer: Self-pay | Admitting: Primary Care

## 2023-08-27 DIAGNOSIS — F32A Depression, unspecified: Secondary | ICD-10-CM

## 2023-09-12 DIAGNOSIS — H524 Presbyopia: Secondary | ICD-10-CM | POA: Diagnosis not present

## 2023-11-21 ENCOUNTER — Ambulatory Visit (INDEPENDENT_AMBULATORY_CARE_PROVIDER_SITE_OTHER): Payer: No Typology Code available for payment source

## 2023-11-21 VITALS — Ht 72.0 in | Wt 138.0 lb

## 2023-11-21 DIAGNOSIS — Z0000 Encounter for general adult medical examination without abnormal findings: Secondary | ICD-10-CM | POA: Diagnosis not present

## 2023-11-21 NOTE — Patient Instructions (Signed)
Don Christian , Thank you for taking time to come for your Medicare Wellness Visit. I appreciate your ongoing commitment to your health goals. Please review the following plan we discussed and let me know if I can assist you in the future.   Referrals/Orders/Follow-Ups/Clinician Recommendations: none  This is a list of the screening recommended for you and due dates:  Health Maintenance  Topic Date Due   Pneumonia Vaccine (3 of 3 - PPSV23 or PCV20) 04/19/2023   Flu Shot  05/05/2023   Medicare Annual Wellness Visit  11/20/2024   Colon Cancer Screening  03/19/2027   Hepatitis C Screening  Completed   Zoster (Shingles) Vaccine  Completed   HPV Vaccine  Aged Out   DTaP/Tdap/Td vaccine  Discontinued   COVID-19 Vaccine  Discontinued    Advanced directives: (Declined) Advance directive discussed with you today. Even though you declined this today, please call our office should you change your mind, and we can give you the proper paperwork for you to fill out. PT SAYS HE HAS THE PAPERWORK TO COMPLETE  Next Medicare Annual Wellness Visit scheduled for next year: Yes 11/21/2024 @ 8:10am televisit

## 2023-11-21 NOTE — Progress Notes (Cosign Needed Addendum)
Please attest and cosign this visit due to patients primary care provider not being in the office at the time the visit was completed.   Subjective:   Don Christian is a 68 y.o. male who presents for Medicare Annual/Subsequent preventive examination.  Visit Complete: Virtual I connected with  Don Christian on 11/21/23 by a audio enabled telemedicine application and verified that I am speaking with the correct person using two identifiers.  Patient Location: Home  Provider Location: Home Office  I discussed the limitations of evaluation and management by telemedicine. The patient expressed understanding and agreed to proceed.  Vital Signs: Because this visit was a virtual/telehealth visit, some criteria may be missing or patient reported. Any vitals not documented were not able to be obtained and vitals that have been documented are patient reported.  Patient Medicare AWV questionnaire was completed by the patient on (not done); I have confirmed that all information answered by patient is correct and no changes since this date.  Cardiac Risk Factors include: advanced age (>51men, >44 women);dyslipidemia;male gender     Objective:    Today's Vitals   11/21/23 1610  Weight: 138 lb (62.6 kg)  Height: 6' (1.829 m)  PainSc: 6    Body mass index is 18.72 kg/m.     11/21/2023    8:24 AM 11/16/2022    8:18 AM 11/13/2021   10:41 AM 11/12/2020   11:15 AM 03/30/2019    8:06 AM 03/27/2018   12:54 PM 03/18/2017   10:33 AM  Advanced Directives  Does Patient Have a Medical Advance Directive? No No No No No No No  Would patient like information on creating a medical advance directive?  No - Patient declined Yes (MAU/Ambulatory/Procedural Areas - Information given) No - Patient declined No - Patient declined Yes (MAU/Ambulatory/Procedural Areas - Information given)     Current Medications (verified) Outpatient Encounter Medications as of 11/21/2023  Medication Sig   albuterol (VENTOLIN HFA)  108 (90 Base) MCG/ACT inhaler INHALE 2 PUFFS BY MOUTH EVERY 6 HOURS AS NEEDED FOR WHEEZING OR SHORTNESS OF BREATH   budesonide-formoterol (SYMBICORT) 160-4.5 MCG/ACT inhaler Inhale 2 puffs into the lungs 2 (two) times daily.   buPROPion (ZYBAN) 150 MG 12 hr tablet TAKE 1 TABLET BY MOUTH DAILY   DULoxetine (CYMBALTA) 20 MG capsule TAKE 1 CAPSULE BY MOUTH DAILY FOR ANXIETY AND DEPRESSION   pravastatin (PRAVACHOL) 40 MG tablet TAKE 1 TABLET BY MOUTH DAILY FOR CHOLESTEROL   tadalafil (CIALIS) 10 MG tablet TAKE 1 TABLET BY MOUTH 60 MINUTES PRIOR TO INTERCOURSE AS NEEDED   traZODone (DESYREL) 100 MG tablet TAKE 1 TABLET BY MOUTH EVERY NIGHT AT BEDTIME FOR SLEEP   No facility-administered encounter medications on file as of 11/21/2023.    Allergies (verified) Patient has no known allergies.   History: Past Medical History:  Diagnosis Date   BPH (benign prostatic hyperplasia) 09/16/2014   Cataract    left eye   Chronic back pain 09/16/2014   Colon polyps    COPD (chronic obstructive pulmonary disease) (HCC)    mild   DDD (degenerative disc disease), lumbar 09/16/2014   Full dentures    Inguinal hernia 2004   Insomnia 09/16/2014   Personal history of tobacco use, presenting hazards to health 03/10/2016   Prostate disorder    S/P lumbar spinal fusion 05/09/2015   Sleeping difficulties    Tobacco use disorder 09/16/2014   Varicella zoster 11/19/2015   Past Surgical History:  Procedure Laterality Date   ABDOMINAL  EXPOSURE N/A 05/09/2015   Procedure: ABDOMINAL EXPOSURE;  Surgeon: Larina Earthly, MD;  Location: [facility];  Service: Vascular;  Laterality: N/A;   ANTERIOR LUMBAR FUSION N/A 05/09/2015   Procedure: LUMBAR FOUR-FIVE ANTERIOR LUMBAR FUSION WITH ABDOMINAL EXPOSURE;  Surgeon: Tia Alert, MD;  Location: [facility];  Service: Neurosurgery;  Laterality: N/A;   COLONOSCOPY W/ BIOPSIES AND POLYPECTOMY     COLONOSCOPY WITH PROPOFOL N/A 03/18/2017   Procedure: COLONOSCOPY WITH PROPOFOL;   Surgeon: Wyline Mood, MD;  Location: [facility];  Service: Endoscopy;  Laterality: N/A;   FOOT SURGERY Right 2021   GANGLION CYST EXCISION     left leg   HERNIA REPAIR Left    inguinal at 14    TOOTH EXTRACTION     Family History  Problem Relation Age of Onset   Cancer Mother        Lung   Heart disease Mother    Cancer Father        Lung with mets to brain   Cancer Sister        Lung   Social History   Socioeconomic History   Marital status: Married    Spouse name: Not on file   Number of children: Not on file   Years of education: Not on file   Highest education level: Not on file  Occupational History   Not on file  Tobacco Use   Smoking status: Former    Types: Cigars    Quit date: 11/04/2018    Years since quitting: 5.0    Passive exposure: Past   Smokeless tobacco: Never  Vaping Use   Vaping status: Never Used  Substance and Sexual Activity   Alcohol use: Not Currently    Comment: occassionally on social occassions   Drug use: No   Sexual activity: Yes  Other Topics Concern   Not on file  Social History Narrative   ** Merged History Encounter **       Work or School: no work currently, disabled from back injury in 2012 Home Situation: lives with wife Spiritual Beliefs: none Lifestyle: walks dog 2x per day;  Healthy diet per his report. Enjoys spending time outdoors.      Social Drivers of Corporate investment banker Strain: Low Risk  (11/21/2023)   Overall Financial Resource Strain (CARDIA)    Difficulty of Paying Living Expenses: Not hard at all  Food Insecurity: No Food Insecurity (11/21/2023)   Hunger Vital Sign    Worried About Running Out of Food in the Last Year: Never true    Ran Out of Food in the Last Year: Never true  Transportation Needs: No Transportation Needs (11/21/2023)   PRAPARE - Administrator, Civil Service (Medical): No    Lack of Transportation (Non-Medical): No  Physical Activity: Sufficiently Active  (11/21/2023)   Exercise Vital Sign    Days of Exercise per Week: 7 days    Minutes of Exercise per Session: 30 min  Stress: No Stress Concern Present (11/21/2023)   [employer] of Occupational Health - Occupational Stress Questionnaire    Feeling of Stress : Only a little  Social Connections: Moderately Isolated (11/21/2023)   Social Connection and Isolation Panel [NHANES]    Frequency of Communication with Friends and Family: Twice a week    Frequency of Social Gatherings with Friends and Family: Three times a week    Attends Religious Services: Never    Active Member of Clubs  or Organizations: No    Attends Banker Meetings: Never    Marital Status: Married    Tobacco Counseling Counseling given: Not Answered   Clinical Intake:  Pre-visit preparation completed: Yes  Pain : 0-10 Pain Score: 6  Pain Type: Chronic pain Pain Location: Back Pain Orientation: Lower Pain Descriptors / Indicators: Aching Pain Onset: More than a month ago Pain Frequency: Constant Pain Relieving Factors: Advil, moving around  Pain Relieving Factors: Advil, moving around  BMI - recorded: 18.72 Nutritional Status: BMI <19  Underweight Nutritional Risks: None Diabetes: No  How often do you need to have someone help you when you read instructions, pamphlets, or other written materials from your doctor or pharmacy?: 1 - Never  Interpreter Needed?: No  Comments: lives with wife Information entered by :: B.Maeli Spacek,LPN   Activities of Daily Living    11/21/2023    8:24 AM  In your present state of health, do you have any difficulty performing the following activities:  Hearing? 0  Vision? 0  Difficulty concentrating or making decisions? 0  Walking or climbing stairs? 0  Dressing or bathing? 0  Doing errands, shopping? 0  Preparing Food and eating ? N  Using the Toilet? N  In the past six months, have you accidently leaked urine? N  Do you have problems with loss of  bowel control? N  Managing your Medications? N  Managing your Finances? N  Housekeeping or managing your Housekeeping? N    Patient Care Team: Doreene Nest, NP as PCP - General (Internal Medicine) Doreene Nest, NP (Nurse Practitioner) Lemar Livings Merrily Pew, MD (General Surgery)  Indicate any recent Medical Services you may have received from other than [facility] providers in the past year (date may be approximate).     Assessment:   This is a routine wellness examination for Alva.  Hearing/Vision screen Hearing Screening - Comments:: Pt says his hearing has some loss but sufficient Vision Screening - Comments:: Pt says his vision is good Dr Dion Body   Goals Addressed             This Visit's Progress    Increase physical activity   On track    11/21/23- I will continue to exercise for at least 30 minutes daily.      Patient Stated   On track    11/21/23- I will maintain and continue medications as prescribed.      Patient Stated   Not on track    11/21/23-still working on this-Travel more.       Depression Screen    11/21/2023    8:21 AM 05/05/2023    7:52 AM 11/16/2022    8:15 AM 04/22/2022   11:58 AM 11/13/2021   10:43 AM 04/08/2021    7:40 AM 11/12/2020   11:16 AM  PHQ 2/9 Scores  PHQ - 2 Score 0 0 0 2 0 0 0  PHQ- 9 Score    3  0 0    Fall Risk    11/21/2023    8:15 AM 05/05/2023    7:51 AM 11/16/2022    8:12 AM 11/15/2022   10:17 AM 11/13/2021   10:42 AM  Fall Risk   Falls in the past year? 0 0 0 0 0  Number falls in past yr: 0 0 0 0 0  Injury with Fall? 0 0 0  0  Risk for fall due to : No Fall Risks No Fall Risks No Fall Risks  No  Fall Risks  Follow up Falls prevention discussed;Education provided Falls evaluation completed Falls prevention discussed;Falls evaluation completed  Falls prevention discussed    MEDICARE RISK AT HOME: Medicare Risk at Home Any stairs in or around the home?: Yes If so, are there any without handrails?: Yes Home free of  loose throw rugs in walkways, pet beds, electrical cords, etc?: Yes Adequate lighting in your home to reduce risk of falls?: Yes Life alert?: No Use of a cane, walker or w/c?: No Grab bars in the bathroom?: Yes Shower chair or bench in shower?: Yes Elevated toilet seat or a handicapped toilet?: Yes  TIMED UP AND GO:  Was the test performed?  No    Cognitive Function:    11/12/2020   11:19 AM 03/30/2019    8:07 AM 03/27/2018   12:53 PM 01/18/2017    8:49 AM  MMSE - Mini Mental State Exam  Not completed: Refused     Orientation to time  5 5 5   Orientation to Place  5 5 5   Registration  3 3 3   Attention/ Calculation  0 0 0  Recall  3 3 3   Language- name 2 objects  0 0 0  Language- repeat  1 1 1   Language- follow 3 step command  0 3 3  Language- read & follow direction  0 0 0  Write a sentence  0 0 0  Copy design  0 0 0  Total score  17 20 20         11/21/2023    8:32 AM 11/16/2022    8:26 AM  6CIT Screen  What Year? 0 points 0 points  What month? 0 points 0 points  What time? 0 points 0 points  Count back from 20 0 points 0 points  Months in reverse 0 points 0 points  Repeat phrase 0 points 0 points  Total Score 0 points 0 points    Immunizations Immunization History  Administered Date(s) Administered   Influenza Inj Mdck Quad Pf 07/27/2018   Influenza,inj,Quad PF,6+ Mos 09/16/2014, 08/11/2015, 07/12/2017, 06/19/2019   Influenza-Unspecified 07/04/2020, 07/14/2021   PFIZER(Purple Top)SARS-COV-2 Vaccination 01/03/2020, 01/28/2020, 08/02/2020, 02/12/2021   Pfizer Covid-19 Vaccine Bivalent Booster 37yrs & up 07/14/2021   Pneumococcal Conjugate-13 04/04/2020   Pneumococcal Polysaccharide-23 04/18/2018   Zoster Recombinant(Shingrix) 09/03/2016, 01/29/2017    TDAP status: Up to date  Flu Vaccine status: Due, Education has been provided regarding the importance of this vaccine. Advised may receive this vaccine at local pharmacy or Health Dept. Aware to provide a copy of  the vaccination record if obtained from local pharmacy or Health Dept. Verbalized acceptance and understanding.  Pneumococcal vaccine status: Up to date  Covid-19 vaccine status: Completed vaccines  Qualifies for Shingles Vaccine? Yes   Zostavax completed Yes   Shingrix Completed?: Yes  Screening Tests Health Maintenance  Topic Date Due   Pneumonia Vaccine 70+ Years old (3 of 3 - PPSV23 or PCV20) 04/19/2023   INFLUENZA VACCINE  05/05/2023   Medicare Annual Wellness (AWV)  11/20/2024   Colonoscopy  03/19/2027   Hepatitis C Screening  Completed   Zoster Vaccines- Shingrix  Completed   HPV VACCINES  Aged Out   DTaP/Tdap/Td  Discontinued   COVID-19 Vaccine  Discontinued    Health Maintenance  Health Maintenance Due  Topic Date Due   Pneumonia Vaccine 28+ Years old (3 of 3 - PPSV23 or PCV20) 04/19/2023   INFLUENZA VACCINE  05/05/2023    Colorectal cancer screening: Type of screening:  Colonoscopy. Completed 03/18/2017. Repeat every 10 years  Lung Cancer Screening: (Low Dose CT Chest recommended if Age 76-80 years, 20 pack-year currently smoking OR have quit w/in 15years.) does not qualify.   Lung Cancer Screening Referral: no  Additional Screening:  Hepatitis C Screening: does not qualify; Completed /20/2017  Vision Screening: Recommended annual ophthalmology exams for early detection of glaucoma and other disorders of the eye. Is the patient up to date with their annual eye exam?  Yes  Who is the provider or what is the name of the office in which the patient attends annual eye exams? Dr Dion Body If pt is not established with a provider, would they like to be referred to a provider to establish care? No .   Dental Screening: Recommended annual dental exams for proper oral hygiene  Diabetic Foot Exam: n/a  Community Resource Referral / Chronic Care Management: CRR required this visit?  No   CCM required this visit?  No     Plan:     I have personally reviewed  and noted the following in the patient's chart:   Medical and social history Use of alcohol, tobacco or illicit drugs  Current medications and supplements including opioid prescriptions. Patient is not currently taking opioid prescriptions. Functional ability and status Nutritional status Physical activity Advanced directives List of other physicians Hospitalizations, surgeries, and ER visits in previous 12 months Vitals Screenings to include cognitive, depression, and falls Referrals and appointments  In addition, I have reviewed and discussed with patient certain preventive protocols, quality metrics, and best practice recommendations. A written personalized care plan for preventive services as well as general preventive health recommendations were provided to patient.     Sue Lush, LPN   [id number]/61/0960   After Visit Summary: (MyChart) Due to this being a telephonic visit, the after visit summary with patients personalized plan was offered to patient via MyChart   Nurse Notes: The patient states he is doing well and has no concerns or questions at this time.

## 2023-12-08 ENCOUNTER — Other Ambulatory Visit: Payer: Self-pay | Admitting: Primary Care

## 2023-12-08 DIAGNOSIS — N529 Male erectile dysfunction, unspecified: Secondary | ICD-10-CM

## 2024-02-28 ENCOUNTER — Other Ambulatory Visit: Payer: Self-pay | Admitting: Primary Care

## 2024-02-28 DIAGNOSIS — J439 Emphysema, unspecified: Secondary | ICD-10-CM

## 2024-02-28 DIAGNOSIS — G4700 Insomnia, unspecified: Secondary | ICD-10-CM

## 2024-02-28 NOTE — Telephone Encounter (Signed)
Patient is due for CPE/follow up in mid August, this will be required prior to any further refills.  Please schedule, thank you!   

## 2024-02-29 NOTE — Telephone Encounter (Signed)
 [email or web address]

## 2024-03-14 ENCOUNTER — Other Ambulatory Visit: Payer: Self-pay | Admitting: Primary Care

## 2024-03-14 DIAGNOSIS — F32A Depression, unspecified: Secondary | ICD-10-CM

## 2024-03-29 ENCOUNTER — Other Ambulatory Visit: Payer: Self-pay | Admitting: Primary Care

## 2024-03-29 DIAGNOSIS — I700 Atherosclerosis of aorta: Secondary | ICD-10-CM

## 2024-03-29 DIAGNOSIS — E782 Mixed hyperlipidemia: Secondary | ICD-10-CM

## 2024-04-29 ENCOUNTER — Other Ambulatory Visit: Payer: Self-pay | Admitting: Primary Care

## 2024-04-29 DIAGNOSIS — F17200 Nicotine dependence, unspecified, uncomplicated: Secondary | ICD-10-CM

## 2024-05-17 ENCOUNTER — Ambulatory Visit (INDEPENDENT_AMBULATORY_CARE_PROVIDER_SITE_OTHER): Admitting: Primary Care

## 2024-05-17 ENCOUNTER — Encounter: Payer: Self-pay | Admitting: Primary Care

## 2024-05-17 VITALS — BP 118/62 | HR 67 | Temp 97.2°F | Ht 72.0 in | Wt 140.0 lb

## 2024-05-17 DIAGNOSIS — J439 Emphysema, unspecified: Secondary | ICD-10-CM

## 2024-05-17 DIAGNOSIS — E782 Mixed hyperlipidemia: Secondary | ICD-10-CM

## 2024-05-17 DIAGNOSIS — R1032 Left lower quadrant pain: Secondary | ICD-10-CM | POA: Diagnosis not present

## 2024-05-17 DIAGNOSIS — F419 Anxiety disorder, unspecified: Secondary | ICD-10-CM | POA: Diagnosis not present

## 2024-05-17 DIAGNOSIS — G4700 Insomnia, unspecified: Secondary | ICD-10-CM

## 2024-05-17 DIAGNOSIS — Z125 Encounter for screening for malignant neoplasm of prostate: Secondary | ICD-10-CM

## 2024-05-17 DIAGNOSIS — I700 Atherosclerosis of aorta: Secondary | ICD-10-CM

## 2024-05-17 DIAGNOSIS — R0609 Other forms of dyspnea: Secondary | ICD-10-CM | POA: Insufficient documentation

## 2024-05-17 DIAGNOSIS — Z0001 Encounter for general adult medical examination with abnormal findings: Secondary | ICD-10-CM | POA: Diagnosis not present

## 2024-05-17 DIAGNOSIS — F32A Depression, unspecified: Secondary | ICD-10-CM

## 2024-05-17 DIAGNOSIS — N529 Male erectile dysfunction, unspecified: Secondary | ICD-10-CM | POA: Diagnosis not present

## 2024-05-17 DIAGNOSIS — R7303 Prediabetes: Secondary | ICD-10-CM | POA: Diagnosis not present

## 2024-05-17 LAB — COMPREHENSIVE METABOLIC PANEL WITH GFR
ALT: 12 U/L (ref 0–53)
AST: 16 U/L (ref 0–37)
Albumin: 4.1 g/dL (ref 3.5–5.2)
Alkaline Phosphatase: 57 U/L (ref 39–117)
BUN: 14 mg/dL (ref 6–23)
CO2: 31 meq/L (ref 19–32)
Calcium: 9.2 mg/dL (ref 8.4–10.5)
Chloride: 103 meq/L (ref 96–112)
Creatinine, Ser: 0.9 mg/dL (ref 0.40–1.50)
GFR: 87.31 mL/min (ref >60.00)
Glucose, Bld: 92 mg/dL (ref 70–99)
Potassium: 4.3 meq/L (ref 3.5–5.1)
Sodium: 140 meq/L (ref 135–145)
Total Bilirubin: 0.6 mg/dL (ref 0.2–1.2)
Total Protein: 6.3 g/dL (ref 6.0–8.3)

## 2024-05-17 LAB — LIPID PANEL
Cholesterol: 169 mg/dL (ref 0–200)
HDL: 64.5 mg/dL (ref >39.00)
LDL Cholesterol: 93 mg/dL (ref 0–99)
NonHDL: 104.02
Total CHOL/HDL Ratio: 3
Triglycerides: 55 mg/dL (ref 0.0–149.0)
VLDL: 11 mg/dL (ref 0.0–40.0)

## 2024-05-17 LAB — HEMOGLOBIN A1C: Hgb A1c MFr Bld: 5.9 % (ref 4.6–6.5)

## 2024-05-17 LAB — CBC
HCT: 43.3 % (ref 39.0–52.0)
Hemoglobin: 14 g/dL (ref 13.0–17.0)
MCHC: 32.4 g/dL (ref 30.0–36.0)
MCV: 90.7 fl (ref 78.0–100.0)
Platelets: 188 K/uL (ref 150.0–400.0)
RBC: 4.77 Mil/uL (ref 4.22–5.81)
RDW: 14.3 % (ref 11.5–15.5)
WBC: 6.1 K/uL (ref 4.0–10.5)

## 2024-05-17 LAB — PSA, MEDICARE: PSA: 4.24 ng/mL — ABNORMAL HIGH (ref 0.10–4.00)

## 2024-05-17 NOTE — Assessment & Plan Note (Signed)
 Repeat A1c pending

## 2024-05-17 NOTE — Assessment & Plan Note (Signed)
 Repeat lipid panel completed. Continue pravastatin  40 mg daily.  CT coronary calcium  scan pending.

## 2024-05-17 NOTE — Assessment & Plan Note (Signed)
 Seems deteriorated based on HPI.   Offered to change regimen, he kindly declines. Would recommend Breztri.  Continue Symbicort  160-4.5 mcg, 2 puffs BID. Continue albuterol  inhaler as needed  Proceed with lung cancer screening as scheduled.

## 2024-05-17 NOTE — Assessment & Plan Note (Signed)
Controlled.  Continue trazodone 100 mg at bedtime.

## 2024-05-17 NOTE — Assessment & Plan Note (Signed)
Immunizations UTD. Colonoscopy UTD, due 2028 PSA due and pending.  Discussed the importance of a healthy diet and regular exercise in order for weight loss, and to reduce the risk of further co-morbidity.  Exam stable. Labs pending.  Follow up in 1 year for repeat physical.

## 2024-05-17 NOTE — Progress Notes (Signed)
 Subjective:    Patient ID: Don Christian, male    DOB: [birth year], 68 y.o.   MRN: [id number]  HPI  Don Christian is a very pleasant 68 y.o. male who presents today for complete physical and follow up of chronic conditions.   Since last He is experiencing exertional dyspnea. Chronic. Occurs only with moderate exertion such as push mowing his yard, general yard work.  He denies dyspnea when grocery shopping, walking around his home, etc.  During symptoms he will have to bend forward and rest his hands on his knees. He will rest for a few seconds to minutes. He denies chest pain, dizziness, fatigue. He is compliant to his Symbicort  BID.   Immunizations:  -Shingles: Completed Shingrix series -Pneumonia: Completed Prevnar 13 in 2021, Pneumovax 23 in 2019  Diet: Fair diet.  Exercise: No regular exercise.  Eye exam: Completes annually  Dental exam: Completes semi-annually    Colonoscopy: Completed in 2018, due 2028 Lung Cancer Screening: Completed in September 2024, scheduled for September 2025 For lab collect CBC okay today okay PSA: Due   BP Readings from Last 3 Encounters:  05/17/24 118/62  05/05/23 124/78  08/09/22 (!) 152/78        Review of Systems  Constitutional:  Negative for unexpected weight change.  HENT:  Negative for rhinorrhea.   Respiratory:  Negative for cough and shortness of breath.   Cardiovascular:  Negative for chest pain.  Gastrointestinal:  Negative for constipation and diarrhea.  Genitourinary:  Negative for difficulty urinating.  Musculoskeletal:  Positive for arthralgias and back pain.  Skin:  Negative for rash.  Allergic/Immunologic: Negative for environmental allergies.  Neurological:  Negative for dizziness and headaches.  Psychiatric/Behavioral:  The patient is not nervous/anxious.          Past Medical History:  Diagnosis Date   BPH (benign prostatic hyperplasia) 09/16/2014   Cataract    left eye   Chronic back pain 09/16/2014    Colon polyps    COPD (chronic obstructive pulmonary disease) (HCC)    mild   DDD (degenerative disc disease), lumbar 09/16/2014   Full dentures    Inguinal hernia 2004   Insomnia 09/16/2014   Personal history of tobacco use, presenting hazards to health 03/10/2016   Prostate disorder    S/P lumbar spinal fusion 05/09/2015   Sleeping difficulties    Tobacco use disorder 09/16/2014   Varicella zoster 11/19/2015    Social History   Socioeconomic History   Marital status: Married    Spouse name: Not on file   Number of children: Not on file   Years of education: Not on file   Highest education level: Not on file  Occupational History   Not on file  Tobacco Use   Smoking status: Former    Types: Cigars    Quit date: 11/04/2018    Years since quitting: 5.5    Passive exposure: Past   Smokeless tobacco: Never  Vaping Use   Vaping status: Never Used  Substance and Sexual Activity   Alcohol use: Not Currently    Comment: occassionally on social occassions   Drug use: No   Sexual activity: Yes  Other Topics Concern   Not on file  Social History Narrative   ** Merged History Encounter **       Work or School: no work currently, disabled from back injury in 2012 Home Situation: lives with wife Spiritual Beliefs: none Lifestyle: walks dog 2x per day;  Healthy diet per his  report. Enjoys spending time outdoors.      Social Drivers of Corporate investment banker Strain: Low Risk  (11/21/2023)   Overall Financial Resource Strain (CARDIA)    Difficulty of Paying Living Expenses: Not hard at all  Food Insecurity: No Food Insecurity (11/21/2023)   Hunger Vital Sign    Worried About Running Out of Food in the Last Year: Never true    Ran Out of Food in the Last Year: Never true  Transportation Needs: No Transportation Needs (11/21/2023)   PRAPARE - Administrator, Civil Service (Medical): No    Lack of Transportation (Non-Medical): No  Physical Activity: Sufficiently  Active (11/21/2023)   Exercise Vital Sign    Days of Exercise per Week: 7 days    Minutes of Exercise per Session: 30 min  Stress: No Stress Concern Present (11/21/2023)   [employer] of Occupational Health - Occupational Stress Questionnaire    Feeling of Stress : Only a little  Social Connections: Moderately Isolated (11/21/2023)   Social Connection and Isolation Panel    Frequency of Communication with Friends and Family: Twice a week    Frequency of Social Gatherings with Friends and Family: Three times a week    Attends Religious Services: Never    Active Member of Clubs or Organizations: No    Attends Banker Meetings: Never    Marital Status: Married  Catering manager Violence: Not At Risk (11/21/2023)   Humiliation, Afraid, Rape, and Kick questionnaire    Fear of Current or Ex-Partner: No    Emotionally Abused: No    Physically Abused: No    Sexually Abused: No    Past Surgical History:  Procedure Laterality Date   ABDOMINAL EXPOSURE N/A 05/09/2015   Procedure: ABDOMINAL EXPOSURE;  Surgeon: Krystal JULIANNA Doing, MD;  Location: [facility];  Service: Vascular;  Laterality: N/A;   ANTERIOR LUMBAR FUSION N/A 05/09/2015   Procedure: LUMBAR FOUR-FIVE ANTERIOR LUMBAR FUSION WITH ABDOMINAL EXPOSURE;  Surgeon: Alm GORMAN Molt, MD;  Location: [facility];  Service: Neurosurgery;  Laterality: N/A;   COLONOSCOPY W/ BIOPSIES AND POLYPECTOMY     COLONOSCOPY WITH PROPOFOL  N/A 03/18/2017   Procedure: COLONOSCOPY WITH PROPOFOL ;  Surgeon: Therisa Bi, MD;  Location: [facility] ENDOSCOPY;  Service: Endoscopy;  Laterality: N/A;   FOOT SURGERY Right 2021   GANGLION CYST EXCISION     left leg   HERNIA REPAIR Left    inguinal at 14    TOOTH EXTRACTION      Family History  Problem Relation Age of Onset   Cancer Mother        Lung   Heart disease Mother    Cancer Father        Lung with mets to brain   Cancer Sister        Lung    No Known Allergies  Current Outpatient Medications on  File Prior to Visit  Medication Sig Dispense Refill   albuterol  (VENTOLIN  HFA) 108 (90 Base) MCG/ACT inhaler INHALE 2 PUFFS BY MOUTH EVERY 6 HOURS AS NEEDED FOR WHEEZING OR FOR SHORTNESS OF BREATH 8.5 g 0   budesonide -formoterol  (SYMBICORT ) 160-4.5 MCG/ACT inhaler Inhale 2 puffs into the lungs 2 (two) times daily. 3 each 3   buPROPion  (ZYBAN ) 150 MG 12 hr tablet TAKE 1 TABLET BY MOUTH DAILY 90 tablet 0   DULoxetine  (CYMBALTA ) 20 MG capsule TAKE 1 CAPSULE BY MOUTH DAILY FOR ANXIETY AND DEPRESSION 90 capsule 0   pravastatin  (  PRAVACHOL ) 40 MG tablet TAKE 1 TABLET BY MOUTH DAILY FOR CHOLESTEROL 90 tablet 0   tadalafil  (CIALIS ) 10 MG tablet TAKE ONE TABLET BY MOUTH 60 MINUTES PRIOR TO INTERCOURSE AS NEEDED 30 tablet 0   traZODone  (DESYREL ) 100 MG tablet TAKE 1 TABLET BY MOUTH EVERY NIGHT AT BEDTIME FOR SLEEP 90 tablet 0   No current facility-administered medications on file prior to visit.    BP 118/62   Pulse 67   Temp (!) 97.2 F (36.2 C) (Temporal)   Ht 6' (1.829 m)   Wt 140 lb (63.5 kg)   SpO2 97%   BMI 18.99 kg/m  Objective:   Physical Exam HENT:     Right Ear: Tympanic membrane and ear canal normal.     Left Ear: Tympanic membrane and ear canal normal.  Eyes:     Pupils: Pupils are equal, round, and reactive to light.  Cardiovascular:     Rate and Rhythm: Normal rate and regular rhythm.  Pulmonary:     Effort: Pulmonary effort is normal.     Breath sounds: Normal breath sounds.  Abdominal:     General: Bowel sounds are normal.     Palpations: Abdomen is soft.     Tenderness: There is no abdominal tenderness.  Musculoskeletal:        General: Normal range of motion.     Cervical back: Neck supple.  Skin:    General: Skin is warm and dry.  Neurological:     Mental Status: He is alert and oriented to person, place, and time.     Cranial Nerves: No cranial nerve deficit.     Deep Tendon Reflexes:     Reflex Scores:      Patellar reflexes are 2+ on the right side and 2+ on  the left side. Psychiatric:        Mood and Affect: Mood normal.           Assessment & Plan:  Encounter for annual general medical examination with abnormal findings in adult Assessment & Plan: Immunizations UTD. Colonoscopy UTD, due 2028 PSA due and pending.  Discussed the importance of a healthy diet and regular exercise in order for weight loss, and to reduce the risk of further co-morbidity.  Exam stable. Labs pending.  Follow up in 1 year for repeat physical.    Pulmonary emphysema, unspecified emphysema type [facility]) Assessment & Plan: Seems deteriorated based on HPI.   Offered to change regimen, he kindly declines. Would recommend Breztri.  Continue Symbicort  160-4.5 mcg, 2 puffs BID. Continue albuterol  inhaler as needed  Proceed with lung cancer screening as scheduled.  Orders: -     CT CARDIAC SCORING (SELF PAY ONLY); Future  Prediabetes Assessment & Plan: Repeat A1c pending.  Orders: -     CT CARDIAC SCORING (SELF PAY ONLY); Future -     Hemoglobin A1c  Mixed hyperlipidemia Assessment & Plan: Repeat lipid panel pending.  Continue pravastatin  40 mg daily.  Orders: -     CT CARDIAC SCORING (SELF PAY ONLY); Future -     Lipid panel -     Comprehensive metabolic panel with GFR -     CBC  Aortic atherosclerosis (HCC) Assessment & Plan: Repeat lipid panel completed. Continue pravastatin  40 mg daily.  CT coronary calcium  scan pending.  Orders: -     CT CARDIAC SCORING (SELF PAY ONLY); Future  Screening for prostate cancer -     PSA, Medicare  Anxiety and depression Assessment &  Plan: Controlled, no concerns today.  Continue bupropion  150 mg daily, Cymbalta  20 mg daily, trazodone  100 mg at bedtime   Erectile dysfunction, unspecified erectile dysfunction type Assessment & Plan: No concerns today.  Continue tadalafil  10 mg as needed.   Insomnia, unspecified type Assessment & Plan: Controlled  Continue trazodone  100 mg at  bedtime   Exertional dyspnea Assessment & Plan: Could be COPD, need to rule out cardiac causes given history and family history.  Offered to change his inhaler regimen from Symbicort  to Breztri, he declines. Will proceed with lung cancer screening in September as scheduled.  Reviewed CT chest from September 2024.  Cardiac coronary calcium  CT ordered and pending.  Continue statin therapy.    Consider cardiology referral, echo. Await results.   Left inguinal pain Assessment & Plan: Question if this is vascular, especially given his history of atherosclerosis.  Will discuss with patient regarding imaging such as duplex artery ultrasound versus CTA.          Scorpio Fortin K Channon Brougher, NP

## 2024-05-17 NOTE — Assessment & Plan Note (Addendum)
 Could be COPD, need to rule out cardiac causes given history and family history.  Offered to change his inhaler regimen from Symbicort  to Breztri, he declines. Will proceed with lung cancer screening in September as scheduled.  Reviewed CT chest from September 2024.  Cardiac coronary calcium  CT ordered and pending.  Continue statin therapy.    Consider cardiology referral, echo. Await results.

## 2024-05-17 NOTE — Assessment & Plan Note (Signed)
 Question if this is vascular, especially given his history of atherosclerosis.  Will discuss with patient regarding imaging such as duplex artery ultrasound versus CTA.

## 2024-05-17 NOTE — Assessment & Plan Note (Signed)
 No concerns today.  Continue tadalafil 10 mg as needed.

## 2024-05-17 NOTE — Assessment & Plan Note (Signed)
 Controlled, no concerns today.  Continue bupropion  150 mg daily, Cymbalta  20 mg daily, trazodone  100 mg at bedtime

## 2024-05-17 NOTE — Assessment & Plan Note (Signed)
 Repeat lipid panel pending.  Continue pravastatin  40 mg daily.

## 2024-05-17 NOTE — Patient Instructions (Signed)
 Stop by the lab prior to leaving today. I will notify you of your results once received.   You will receive a phone call regarding the CT scan of your heart.  It was a pleasure to see you today!

## 2024-05-18 ENCOUNTER — Ambulatory Visit: Payer: Self-pay | Admitting: Primary Care

## 2024-05-18 DIAGNOSIS — R972 Elevated prostate specific antigen [PSA]: Secondary | ICD-10-CM

## 2024-05-24 ENCOUNTER — Ambulatory Visit
Admission: RE | Admit: 2024-05-24 | Discharge: 2024-05-24 | Disposition: A | Discharge status: Home / Self Care | Payer: Self-pay | Source: Ambulatory Visit | Attending: Primary Care | Admitting: Primary Care

## 2024-05-24 DIAGNOSIS — E782 Mixed hyperlipidemia: Secondary | ICD-10-CM | POA: Insufficient documentation

## 2024-05-24 DIAGNOSIS — I700 Atherosclerosis of aorta: Secondary | ICD-10-CM | POA: Insufficient documentation

## 2024-05-24 DIAGNOSIS — R7303 Prediabetes: Secondary | ICD-10-CM | POA: Insufficient documentation

## 2024-05-24 DIAGNOSIS — J439 Emphysema, unspecified: Secondary | ICD-10-CM | POA: Insufficient documentation

## 2024-05-27 ENCOUNTER — Other Ambulatory Visit: Payer: Self-pay | Admitting: Primary Care

## 2024-05-27 DIAGNOSIS — G4700 Insomnia, unspecified: Secondary | ICD-10-CM

## 2024-05-31 ENCOUNTER — Encounter: Admitting: Primary Care

## 2024-06-07 ENCOUNTER — Ambulatory Visit
Admission: RE | Admit: 2024-06-07 | Discharge: 2024-06-07 | Disposition: A | Discharge status: Home / Self Care | Source: Ambulatory Visit | Attending: Acute Care | Admitting: Acute Care

## 2024-06-07 DIAGNOSIS — Z122 Encounter for screening for malignant neoplasm of respiratory organs: Secondary | ICD-10-CM | POA: Insufficient documentation

## 2024-06-07 DIAGNOSIS — I700 Atherosclerosis of aorta: Secondary | ICD-10-CM | POA: Insufficient documentation

## 2024-06-07 DIAGNOSIS — J439 Emphysema, unspecified: Secondary | ICD-10-CM | POA: Insufficient documentation

## 2024-06-07 DIAGNOSIS — Z87891 Personal history of nicotine dependence: Secondary | ICD-10-CM | POA: Diagnosis present

## 2024-06-07 DIAGNOSIS — F17210 Nicotine dependence, cigarettes, uncomplicated: Secondary | ICD-10-CM | POA: Diagnosis present

## 2024-06-14 ENCOUNTER — Other Ambulatory Visit: Payer: Self-pay | Admitting: Primary Care

## 2024-06-14 DIAGNOSIS — F419 Anxiety disorder, unspecified: Secondary | ICD-10-CM

## 2024-06-21 ENCOUNTER — Other Ambulatory Visit: Payer: Self-pay

## 2024-06-21 DIAGNOSIS — Z122 Encounter for screening for malignant neoplasm of respiratory organs: Secondary | ICD-10-CM

## 2024-06-21 DIAGNOSIS — Z87891 Personal history of nicotine dependence: Secondary | ICD-10-CM

## 2024-06-27 ENCOUNTER — Ambulatory Visit: Payer: Self-pay | Admitting: Primary Care

## 2024-06-27 ENCOUNTER — Other Ambulatory Visit: Payer: Self-pay

## 2024-06-27 ENCOUNTER — Other Ambulatory Visit (INDEPENDENT_AMBULATORY_CARE_PROVIDER_SITE_OTHER)

## 2024-06-27 DIAGNOSIS — R972 Elevated prostate specific antigen [PSA]: Secondary | ICD-10-CM

## 2024-06-27 DIAGNOSIS — E782 Mixed hyperlipidemia: Secondary | ICD-10-CM

## 2024-06-27 DIAGNOSIS — I700 Atherosclerosis of aorta: Secondary | ICD-10-CM

## 2024-06-27 LAB — PSA: PSA: 5.57 ng/mL — ABNORMAL HIGH (ref 0.10–4.00)

## 2024-06-27 MED ORDER — PRAVASTATIN SODIUM 40 MG PO TABS: 40.0000 mg | ORAL_TABLET | Freq: Every day | ORAL | 2 refills | Status: AC

## 2024-06-30 ENCOUNTER — Other Ambulatory Visit: Payer: Self-pay | Admitting: Primary Care

## 2024-06-30 DIAGNOSIS — I700 Atherosclerosis of aorta: Secondary | ICD-10-CM

## 2024-06-30 DIAGNOSIS — E782 Mixed hyperlipidemia: Secondary | ICD-10-CM

## 2024-07-16 ENCOUNTER — Other Ambulatory Visit: Payer: Self-pay

## 2024-07-16 DIAGNOSIS — J439 Emphysema, unspecified: Secondary | ICD-10-CM

## 2024-07-16 MED ORDER — BUDESONIDE-FORMOTEROL FUMARATE 160-4.5 MCG/ACT IN AERO: 2.0000 | INHALATION_SPRAY | Freq: Two times a day (BID) | RESPIRATORY_TRACT | 3 refills | Status: AC

## 2024-07-17 ENCOUNTER — Ambulatory Visit: Admitting: Urology

## 2024-07-31 ENCOUNTER — Ambulatory Visit (INDEPENDENT_AMBULATORY_CARE_PROVIDER_SITE_OTHER): Admitting: Urology

## 2024-07-31 ENCOUNTER — Ambulatory Visit: Admitting: Urology

## 2024-07-31 ENCOUNTER — Encounter: Payer: Self-pay | Admitting: Urology

## 2024-07-31 VITALS — BP 136/72 | HR 67 | Ht 71.0 in | Wt 138.0 lb

## 2024-07-31 DIAGNOSIS — R300 Dysuria: Secondary | ICD-10-CM | POA: Diagnosis not present

## 2024-07-31 DIAGNOSIS — R972 Elevated prostate specific antigen [PSA]: Secondary | ICD-10-CM

## 2024-07-31 LAB — URINALYSIS, COMPLETE
Bilirubin, UA: NEGATIVE
Glucose, UA: NEGATIVE
Ketones, UA: NEGATIVE
Leukocytes,UA: NEGATIVE
Nitrite, UA: NEGATIVE
Protein,UA: NEGATIVE
RBC, UA: NEGATIVE
Specific Gravity, UA: 1.02 (ref 1.005–1.030)
Urobilinogen, Ur: 0.2 mg/dL (ref 0.2–1.0)
pH, UA: 6 (ref 5.0–7.5)

## 2024-07-31 LAB — MICROSCOPIC EXAMINATION: Bacteria, UA: NONE SEEN

## 2024-07-31 LAB — BLADDER SCAN AMB NON-IMAGING: Scan Result: 2

## 2024-07-31 NOTE — Progress Notes (Unsigned)
 07/31/2024 8:37 AM   Don Christian Feb 07, 1956 [id number]  Referring provider: Gretta Comer POUR, NP [id number] [place],  [place] [address]  Chief Complaint  Patient presents with   Elevated PSA    HPI: Don Christian is a 68 y.o. male referred for evaluation of an elevated PSA.  PSA levels: 5.57 on 06/27/2024; 4.24 on 05/17/2024 Baseline PSA level: See below Lower urinary tract symptoms: 5-year history of progressively worsening lower urinary tract symptoms including urinary hesitancy, weak stream.  IPSS today 17/35.  Unable to tolerate tamsulosin  secondary to orthostatic symptoms Family history prostate cancer first-degree relatives: Negative Past urologic history: Negative Complains of lower abdominal discomfort  PSA trend    Prostate Specific Ag, Serum  Latest Ref Rng 0.0 - 4.0 ng/mL  03/27/2018 2.10  04/08/2021 2.86  04/22/2022 2.61  05/05/2023 3.14  05/17/2024 4.24  06/27/2024 5.57     PMH: Past Medical History:  Diagnosis Date   BPH (benign prostatic hyperplasia) 09/16/2014   Cataract    left eye   Chronic back pain 09/16/2014   Colon polyps    COPD (chronic obstructive pulmonary disease) (HCC)    mild   DDD (degenerative disc disease), lumbar 09/16/2014   Full dentures    Inguinal hernia 2004   Insomnia 09/16/2014   Personal history of tobacco use, presenting hazards to health 03/10/2016   Prostate disorder    S/P lumbar spinal fusion 05/09/2015   Sleeping difficulties    Tobacco use disorder 09/16/2014   Varicella zoster 11/19/2015    Surgical History: Past Surgical History:  Procedure Laterality Date   ABDOMINAL EXPOSURE N/A 05/09/2015   Procedure: ABDOMINAL EXPOSURE;  Surgeon: Krystal JULIANNA Doing, MD;  Location: [facility];  Service: Vascular;  Laterality: N/A;   ANTERIOR LUMBAR FUSION N/A 05/09/2015   Procedure: LUMBAR FOUR-FIVE ANTERIOR LUMBAR FUSION WITH ABDOMINAL EXPOSURE;  Surgeon: Alm GORMAN Molt, MD;  Location: [facility];  Service: Neurosurgery;   Laterality: N/A;   COLONOSCOPY W/ BIOPSIES AND POLYPECTOMY     COLONOSCOPY WITH PROPOFOL  N/A 03/18/2017   Procedure: COLONOSCOPY WITH PROPOFOL ;  Surgeon: Therisa Bi, MD;  Location: [facility] ENDOSCOPY;  Service: Endoscopy;  Laterality: N/A;   FOOT SURGERY Right 2021   GANGLION CYST EXCISION     left leg   HERNIA REPAIR Left    inguinal at 14    TOOTH EXTRACTION      Home Medications:  Allergies as of 07/31/2024   No Known Allergies      Medication List        Accurate as of July 31, 2024  8:37 AM. If you have any questions, ask your nurse or doctor.          albuterol  108 (90 Base) MCG/ACT inhaler Commonly known as: VENTOLIN  HFA INHALE 2 PUFFS BY MOUTH EVERY 6 HOURS AS NEEDED FOR WHEEZING OR FOR SHORTNESS OF BREATH   budesonide -formoterol  160-4.5 MCG/ACT inhaler Commonly known as: SYMBICORT  Inhale 2 puffs into the lungs 2 (two) times daily.   buPROPion  150 MG 12 hr tablet Commonly known as: ZYBAN  TAKE 1 TABLET BY MOUTH DAILY   DULoxetine  20 MG capsule Commonly known as: CYMBALTA  TAKE 1 CAPSULE BY MOUTH DAILY FOR ANXIETY AND DEPRESSION   Fluzone High-Dose 0.5 ML injection Generic drug: Influenza vac split trivalent PF   pravastatin  40 MG tablet Commonly known as: PRAVACHOL  Take 1 tablet (40 mg total) by mouth daily. for cholesterol.   tadalafil  10 MG tablet Commonly known as: CIALIS  TAKE  ONE TABLET BY MOUTH 60 MINUTES PRIOR TO INTERCOURSE AS NEEDED   traZODone  100 MG tablet Commonly known as: DESYREL  TAKE 1 TABLET BY MOUTH EVERY NIGHT AT BEDTIME FOR SLEEP        Allergies: No Known Allergies  Family History: Family History  Problem Relation Age of Onset   Cancer Mother        Lung   Heart disease Mother    Cancer Father        Lung with mets to brain   Cancer Sister        Lung    Social History:  reports that he quit smoking about 5 years ago. His smoking use included cigars. He has been exposed to tobacco smoke. He has never used smokeless  tobacco. He reports that he does not currently use alcohol. He reports that he does not use drugs.   Physical Exam: BP 136/72   Pulse 67   Ht 5' 11 (1.803 m)   Wt 138 lb (62.6 kg)   BMI 19.25 kg/m   Constitutional:  Alert and oriented, No acute distress. HEENT: [place] AT Respiratory: Normal respiratory effort, no increased work of breathing. GU: Prostate 50 g smooth without nodules Psychiatric: Normal mood and affect.   Assessment & Plan:    1.  Elevated PSA Although PSA is a prostate cancer screening test he was informed that cancer is not the most common cause of an elevated PSA. Other potential causes including BPH and inflammation were discussed.  He was informed that the only way to adequately diagnose prostate cancer would be transrectal ultrasound and biopsy of the prostate. The procedure was discussed including potential risks of bleeding and infection/sepsis. He was also informed that a negative biopsy does not conclusively rule out the possibility that prostate cancer may be present and that continued monitoring is required.  The use of multiparametric prostate MRI to evaluate for lesions suspicious for high grade prostate cancer and aid in targeted bx was reviewed.  Continued periodic surveillance was also discussed. Urinalysis was ordered and if it shows no evidence of infection have recommended further evaluation with a prostate MRI  2.  BPH with LUTS UA today PVR 2 mL   [name], MD  [facility] [address], [address] [place], [place] [address] [phone]

## 2024-08-03 ENCOUNTER — Telehealth: Payer: Self-pay | Admitting: Urology

## 2024-08-03 ENCOUNTER — Encounter: Payer: Self-pay | Admitting: Urology

## 2024-08-03 NOTE — Telephone Encounter (Signed)
 Urinalysis was normal.  Recommend prostate MRI for further evaluation of his elevated PSA.  Please place order and patient will be contacted with the results

## 2024-08-06 ENCOUNTER — Other Ambulatory Visit: Payer: Self-pay | Admitting: *Deleted

## 2024-08-06 DIAGNOSIS — R972 Elevated prostate specific antigen [PSA]: Secondary | ICD-10-CM

## 2024-08-12 ENCOUNTER — Other Ambulatory Visit: Payer: Self-pay | Admitting: Primary Care

## 2024-08-12 DIAGNOSIS — F17200 Nicotine dependence, unspecified, uncomplicated: Secondary | ICD-10-CM

## 2024-08-13 ENCOUNTER — Ambulatory Visit
Admission: RE | Admit: 2024-08-13 | Discharge: 2024-08-13 | Disposition: A | Discharge status: Home / Self Care | Source: Ambulatory Visit | Attending: Urology | Admitting: Urology

## 2024-08-13 DIAGNOSIS — R972 Elevated prostate specific antigen [PSA]: Secondary | ICD-10-CM | POA: Insufficient documentation

## 2024-08-13 MED ORDER — GADOBUTROL 1 MMOL/ML IV SOLN
6.0000 mL | Freq: Once | INTRAVENOUS | Status: AC | PRN
  Administered 2024-08-13: 6 mL via INTRAVENOUS

## 2024-08-15 ENCOUNTER — Ambulatory Visit: Payer: Self-pay | Admitting: Urology

## 2024-08-18 ENCOUNTER — Other Ambulatory Visit: Payer: Self-pay | Admitting: Urology

## 2024-08-18 MED ORDER — DIAZEPAM 5 MG PO TABS: ORAL_TABLET | ORAL | 0 refills | Status: DC

## 2024-08-23 ENCOUNTER — Ambulatory Visit: Admitting: Urology

## 2024-08-23 VITALS — BP 118/70 | HR 75 | Wt 145.0 lb

## 2024-08-23 DIAGNOSIS — Z2989 Encounter for other specified prophylactic measures: Secondary | ICD-10-CM

## 2024-08-23 DIAGNOSIS — C61 Malignant neoplasm of prostate: Secondary | ICD-10-CM | POA: Diagnosis not present

## 2024-08-23 DIAGNOSIS — R972 Elevated prostate specific antigen [PSA]: Secondary | ICD-10-CM

## 2024-08-23 MED ORDER — LEVOFLOXACIN 500 MG PO TABS
500.0000 mg | ORAL_TABLET | Freq: Once | ORAL | Status: AC
Start: 2024-08-23 — End: 2024-08-23
  Administered 2024-08-23: 500 mg via ORAL

## 2024-08-23 MED ORDER — GENTAMICIN SULFATE 40 MG/ML IJ SOLN
80.0000 mg | Freq: Once | INTRAMUSCULAR | Status: AC
Start: 2024-08-23 — End: 2024-08-23
  Administered 2024-08-23: 80 mg via INTRAMUSCULAR

## 2024-08-23 NOTE — Progress Notes (Signed)
   08/23/24  Indication: Elevated PSA, 5.57, abnormal prostate MRI  MRI Fusion Prostate Biopsy Procedure   Informed consent was obtained, and we discussed the risks of bleeding and infection/sepsis. A time out was performed to ensure correct patient identity.  Pre-Procedure: - Last PSA Level: 5.57 - Gentamicin and levaquin given for antibiotic prophylaxis -Prostate measured 27 g on MRI, PSA density 0.21 - No significant hypoechoic or median lobe noted  Procedure: - Prostate block performed using 10 cc 1% lidocaine   - MRI fusion biopsy was performed:  ROI #1: PIRADS 4 lesion (3 cores)  - Standard biopsies taken 6 lateral template areas under ultrasound guidance  - Total of 9 cores taken  Post-Procedure: - Patient tolerated the procedure well - He was counseled to seek immediate medical attention if experiences significant bleeding, fevers, or severe pain - Return in one week to discuss biopsy results  Assessment/ Plan: Will follow up in 1-2 weeks to discuss pathology with Dr. Twylla Redell Burnet, MD 08/23/2024

## 2024-08-23 NOTE — Addendum Note (Signed)
 Addended by: Ziah Leandro A on: 08/23/2024 03:13 PM   Modules accepted: Orders

## 2024-08-23 NOTE — Patient Instructions (Signed)

## 2024-08-29 ENCOUNTER — Ambulatory Visit: Admitting: Urology

## 2024-08-29 LAB — PROSTATE CORE NEEDLE BIOPSY

## 2024-09-07 ENCOUNTER — Ambulatory Visit: Admitting: Urology

## 2024-09-07 ENCOUNTER — Encounter: Payer: Self-pay | Admitting: Urology

## 2024-09-07 VITALS — BP 126/75 | HR 72 | Ht 72.0 in | Wt 144.0 lb

## 2024-09-07 DIAGNOSIS — R972 Elevated prostate specific antigen [PSA]: Secondary | ICD-10-CM

## 2024-09-07 DIAGNOSIS — C61 Malignant neoplasm of prostate: Secondary | ICD-10-CM | POA: Diagnosis not present

## 2024-09-09 ENCOUNTER — Encounter: Payer: Self-pay | Admitting: Urology

## 2024-09-09 NOTE — Progress Notes (Signed)
 09/07/2024 5:39 PM   Don Christian December 02, 1955 [id number]  Referring provider: Gretta Comer POUR, NP [address] [place],  [place] [address]  Chief Complaint  Patient presents with   Results    HPI: Don Christian is a 68 y.o. male presents for prostate biopsy follow-up.  Prostate biopsy performed 08/23/2024 for a PSA of 5.57 and prostate MRI showing a 27 cc prostate volume and a PI-RADS 4 lesion left posterolateral PZ mid gland-apex.  No extra-prostatic abnormalities identified Extent template biopsy was performed in addition to 3 cores of the PI-RADS 4 lesion No postbiopsy complaints. Pathology: 3/3 left sided cores showed Gleason 4+4 adenocarcinoma at LB, LA (22%, 14%) and Gleason 4+3 adenocarcinoma LM (25%).  ROI cores positive for Gleason 3+4 adenocarcinoma (80%).  PMH: Past Medical History:  Diagnosis Date   BPH (benign prostatic hyperplasia) 09/16/2014   Cataract    left eye   Chronic back pain 09/16/2014   Colon polyps    COPD (chronic obstructive pulmonary disease) (HCC)    mild   DDD (degenerative disc disease), lumbar 09/16/2014   Full dentures    Inguinal hernia 2004   Insomnia 09/16/2014   Personal history of tobacco use, presenting hazards to health 03/10/2016   Prostate disorder    S/P lumbar spinal fusion 05/09/2015   Sleeping difficulties    Tobacco use disorder 09/16/2014   Varicella zoster 11/19/2015    Surgical History: Past Surgical History:  Procedure Laterality Date   ABDOMINAL EXPOSURE N/A 05/09/2015   Procedure: ABDOMINAL EXPOSURE;  Surgeon: Krystal JULIANNA Doing, MD;  Location: [facility];  Service: Vascular;  Laterality: N/A;   ANTERIOR LUMBAR FUSION N/A 05/09/2015   Procedure: LUMBAR FOUR-FIVE ANTERIOR LUMBAR FUSION WITH ABDOMINAL EXPOSURE;  Surgeon: Alm GORMAN Molt, MD;  Location: [facility];  Service: Neurosurgery;  Laterality: N/A;   COLONOSCOPY W/ BIOPSIES AND POLYPECTOMY     COLONOSCOPY WITH PROPOFOL  N/A 03/18/2017   Procedure: COLONOSCOPY  WITH PROPOFOL ;  Surgeon: Therisa Bi, MD;  Location: [facility] ENDOSCOPY;  Service: Endoscopy;  Laterality: N/A;   FOOT SURGERY Right 2021   GANGLION CYST EXCISION     left leg   HERNIA REPAIR Left    inguinal at 14    TOOTH EXTRACTION      Home Medications:  Allergies as of 09/07/2024   No Known Allergies      Medication List        Accurate as of September 07, 2024 11:59 PM. If you have any questions, ask your nurse or doctor.          STOP taking these medications    diazepam  5 MG tablet Commonly known as: VALIUM  Stopped by: [name]       TAKE these medications    albuterol  108 (90 Base) MCG/ACT inhaler Commonly known as: VENTOLIN  HFA INHALE 2 PUFFS BY MOUTH EVERY 6 HOURS AS NEEDED FOR WHEEZING OR FOR SHORTNESS OF BREATH   budesonide -formoterol  160-4.5 MCG/ACT inhaler Commonly known as: SYMBICORT  Inhale 2 puffs into the lungs 2 (two) times daily.   buPROPion  150 MG 12 hr tablet Commonly known as: ZYBAN  TAKE 1 TABLET BY MOUTH DAILY   DULoxetine  20 MG capsule Commonly known as: CYMBALTA  TAKE 1 CAPSULE BY MOUTH DAILY FOR ANXIETY AND DEPRESSION   Fluzone High-Dose 0.5 ML injection Generic drug: Influenza vac split trivalent PF   pravastatin  40 MG tablet Commonly known as: PRAVACHOL  Take 1 tablet (40 mg total) by mouth daily. for cholesterol.  tadalafil  10 MG tablet Commonly known as: CIALIS  TAKE ONE TABLET BY MOUTH 60 MINUTES PRIOR TO INTERCOURSE AS NEEDED   traZODone  100 MG tablet Commonly known as: DESYREL  TAKE 1 TABLET BY MOUTH EVERY NIGHT AT BEDTIME FOR SLEEP        Allergies: No Known Allergies  Family History: Family History  Problem Relation Age of Onset   Cancer Mother        Lung   Heart disease Mother    Cancer Father        Lung with mets to brain   Cancer Sister        Lung    Social History:  reports that he quit smoking about 5 years ago. His smoking use included cigars. He has been exposed to tobacco smoke. He has  never used smokeless tobacco. He reports that he does not currently use alcohol. He reports that he does not use drugs.   Physical Exam: BP 126/75   Pulse 72   Ht 6' (1.829 m)   Wt 144 lb (65.3 kg)   BMI 19.53 kg/m   Constitutional:  Alert, No acute distress. HEENT: [place] AT Respiratory: Normal respiratory effort, no increased work of breathing. Psychiatric: Normal mood and affect.   Assessment & Plan:    1.  Prostate cancer T1c 4+4 adenocarcinoma NCCN risk stratification: High Recommend PSMA/PET for staging If no evidence of metastatic disease we discussed curative treatment options of RALP and radiation modalities including EBRT + ADT and EBRT/brachytherapy + ADT He will be notified with the PET scan results and further recommendations at that time   [name], MD  [facility] [address], [address] [place], [place] [address] [phone]

## 2024-09-11 ENCOUNTER — Telehealth: Payer: Self-pay | Admitting: Urology

## 2024-09-11 NOTE — Telephone Encounter (Signed)
 Pt called asking about PET scan.  He said he called scheduling and they told him he needed prior auth and it hadn't been started.

## 2024-09-19 ENCOUNTER — Ambulatory Visit: Admission: RE | Admit: 2024-09-19 | Discharge: 2024-09-19 | Attending: Urology | Admitting: Urology

## 2024-09-19 DIAGNOSIS — C61 Malignant neoplasm of prostate: Secondary | ICD-10-CM | POA: Insufficient documentation

## 2024-09-19 DIAGNOSIS — R972 Elevated prostate specific antigen [PSA]: Secondary | ICD-10-CM | POA: Diagnosis present

## 2024-09-19 MED ORDER — FLOTUFOLASTAT F 18 GALLIUM 296-5846 MBQ/ML IV SOLN
8.0000 | Freq: Once | INTRAVENOUS | Status: AC
  Administered 2024-09-19: 14:00:00 8.28 via INTRAVENOUS
  Filled 2024-09-19: qty 8

## 2024-09-20 ENCOUNTER — Ambulatory Visit: Payer: Self-pay | Admitting: Urology

## 2024-09-20 ENCOUNTER — Other Ambulatory Visit: Payer: Self-pay | Admitting: Urology

## 2024-09-20 DIAGNOSIS — C61 Malignant neoplasm of prostate: Secondary | ICD-10-CM

## 2024-10-08 ENCOUNTER — Ambulatory Visit: Payer: Self-pay

## 2024-10-08 NOTE — Telephone Encounter (Signed)
 Happy to see patient, but if Dr. Watt has an opening it may be better for him to evaluate.

## 2024-10-08 NOTE — Telephone Encounter (Signed)
 FYI Only or Action Required?: FYI only for provider: appointment scheduled on 1.7.26.  Patient was last seen in primary care on 05/17/2024 by Gretta Comer POUR, NP.  Called Nurse Triage reporting Shoulder Pain.  Symptoms began about a month ago.  Interventions attempted: OTC medications: tylenol ,ibuprofen and Ice/heat application.  Symptoms are: gradually worsening.  Triage Disposition: See PCP When Office is Open (Within 3 Days)  Patient/caregiver understands and will follow disposition?: Yes     Copied from CRM [id number]. Topic: Clinical - Red Word Triage >> Oct 08, 2024  9:07 AM Don Christian wrote: Red Word that prompted transfer to Nurse Triage: Can't lift shoulder, deep intense pain Reason for Disposition  [1] MODERATE pain (e.g., interferes with normal activities) AND [2] present > 3 days  Answer Assessment - Initial Assessment Questions Pt had pet scan on 12/17 and couldn't raise his arm over his head at that point. The only thing he can think of is he was pulling a dead branch out of a tree awhile ago but doesn't recall any injury. He states he now can't get it to be even with his shoulder. He states it is a dull ache when he moves it or is picking something up with that arm.       1. ONSET: When did the pain start?    3-4 weeks 2. LOCATION: Where is the pain located?     Left shoulder 3. PAIN: How bad is the pain? (Scale 1-10; or mild, moderate, severe)     moderate 4. WORK OR EXERCISE: Has there been any recent work or exercise that involved this part of the body?     unknown 5. CAUSE: What do you think is causing the shoulder pain?     unknown 6. OTHER SYMPTOMS: Do you have any other symptoms? (e.g., neck pain, swelling, rash, fever, numbness, weakness)     Denies. Does have some weakness due to the pain  Protocols used: Shoulder Pain-A-AH

## 2024-10-10 ENCOUNTER — Ambulatory Visit: Admitting: Family Medicine

## 2024-10-10 ENCOUNTER — Encounter: Payer: Self-pay | Admitting: Family Medicine

## 2024-10-10 VITALS — BP 116/70 | HR 80 | Temp 97.8°F | Ht 72.0 in | Wt 145.1 lb

## 2024-10-10 DIAGNOSIS — M7502 Adhesive capsulitis of left shoulder: Secondary | ICD-10-CM | POA: Diagnosis not present

## 2024-10-10 NOTE — Progress Notes (Signed)
 "    Don Gallant T. Dakiyah Heinke, MD, CAQ Sports Medicine [facility] [address] [place], [address]  Phone: [phone]  FAX: [phone]  Don Christian - 69 y.o. male  MRN [id number]  Date of Birth: 1956-01-28  Date: 10/10/2024  PCP: Gretta Comer POUR, NP  Referral: Gretta Comer POUR, NP  Chief Complaint  Patient presents with   Shoulder Pain    Left   Subjective:   Don Christian is a 69 y.o. very pleasant male patient with Body mass index is 19.68 kg/m. who presents with the following:  Discussed the use of AI scribe software for clinical note transcription with the patient, who gave verbal consent to proceed.  L shoulder pain:    History of Present Illness Don Christian is a 69 year old male with prostate cancer who presents with L shoulder pain and limited range of motion.  He has been experiencing shoulder pain and limited range of motion since around September 19, 2024. The pain varies in location depending on movement and has transitioned from a sharp pain to a dull ache. It sometimes disrupts his sleep, particularly depending on his sleeping position. No major injury, fall, or accident is reported, but he recalls reaching up to pull a dead branch from a tree without specific injury. No numbness or tingling is present.  He underwent a PET scan on October 20, 2024, to ensure the prostate cancer had not spread. During the scan, he was unable to raise his arm above his head.  - PET scan as well as CT of the chest reviewed independently, patient does not have significant osteoarthritis of the glenohumeral joint.  He has a history of smoking and emphysema. He frequently works out in the yard, which sometimes results in pulled muscles.    Review of Systems is noted in the HPI, as appropriate  Objective:   BP 116/70   Pulse 80   Temp 97.8 F (36.6 C) (Temporal)   Ht 6' (1.829 m)   Wt 145 lb 2 oz (65.8 kg)   SpO2 99%   BMI 19.68  kg/m   GEN: No acute distress; alert,appropriate. PULM: Breathing comfortably in no respiratory distress PSYCH: Normally interactive.    Shoulder: R and L Inspection: No muscle wasting or winging Ecchymosis/edema: neg  AC joint, scapula, clavicle: NT Cervical spine: NT, full ROM Spurling's: neg ABNORMAL SIDE TESTED: Left UNLESS OTHERWISE NOTED, THE CONTRALATERAL SIDE HAS FULL RANGE OF MOTION. Abduction: 5/5, LIMITED TO 90 DEGREES Flexion: 5/5, LIMITED TO 100 DEGNO ROM  IR, lift-off: 5/5. TESTED AT 90 DEGREES OF ABDUCTION, LIMITED TO 5 DEGREES ER at neutral:  5/5, TESTED AT 90 DEGREES OF ABDUCTION, LIMITED TO 35 DEGREES AC crossover and compression: PAIN Drop Test: neg Empty Can: neg Supraspinatus insertion: NT Bicipital groove: NT ALL OTHER SPECIAL TESTING EQUIVOCAL GIVEN LOSS OF MOTION C5-T1 intact Sensation intact Grip 5/5    Laboratory and Imaging Data:  Assessment and Plan:     ICD-10-CM   1. Adhesive capsulitis of left shoulder  M75.02      Assessment & Plan Adhesive capsulitis of left shoulder Idiopathic adhesive capsulitis with restricted motion and dull ache. Imaging negative for significant arthritis. Early intervention with exercises expected to expedite recovery. - Provided daily range of motion exercise program. - Advised use of household items like a golf club, broom, or hockey stick for exercises. - Encouraged consistent daily stretching to improve mobility. - Scheduled follow-up in two months to assess  progress.  Medication Management during today's office visit: No orders of the defined types were placed in this encounter.  Medications Discontinued During This Encounter  Medication Reason   FLUZONE HIGH-DOSE 0.5 ML injection     Orders placed today for conditions managed today: No orders of the defined types were placed in this encounter.   Disposition: Return in about 2 months (around 12/08/2024) for Dr. Watt, frozen L shoulder.  [employer] speech-to-text software was used for transcription in this dictation.  Possible transcriptional errors can occur using Animal nutritionist.   Signed,  Jacques DASEN. Macie Baum, MD   Outpatient Encounter Medications as of 10/10/2024  Medication Sig   albuterol  (VENTOLIN  HFA) 108 (90 Base) MCG/ACT inhaler INHALE 2 PUFFS BY MOUTH EVERY 6 HOURS AS NEEDED FOR WHEEZING OR FOR SHORTNESS OF BREATH   budesonide -formoterol  (SYMBICORT ) 160-4.5 MCG/ACT inhaler Inhale 2 puffs into the lungs 2 (two) times daily.   buPROPion  (ZYBAN ) 150 MG 12 hr tablet TAKE 1 TABLET BY MOUTH DAILY   DULoxetine  (CYMBALTA ) 20 MG capsule TAKE 1 CAPSULE BY MOUTH DAILY FOR ANXIETY AND DEPRESSION   pravastatin  (PRAVACHOL ) 40 MG tablet Take 1 tablet (40 mg total) by mouth daily. for cholesterol.   tadalafil  (CIALIS ) 10 MG tablet TAKE ONE TABLET BY MOUTH 60 MINUTES PRIOR TO INTERCOURSE AS NEEDED   traZODone  (DESYREL ) 100 MG tablet TAKE 1 TABLET BY MOUTH EVERY NIGHT AT BEDTIME FOR SLEEP   [DISCONTINUED] FLUZONE HIGH-DOSE 0.5 ML injection    No facility-administered encounter medications on file as of 10/10/2024.   "

## 2024-10-17 ENCOUNTER — Ambulatory Visit
Admission: RE | Admit: 2024-10-17 | Discharge: 2024-10-17 | Disposition: A | Discharge status: Home / Self Care | Source: Ambulatory Visit | Attending: Radiation Oncology | Admitting: Radiation Oncology

## 2024-10-17 ENCOUNTER — Encounter: Payer: Self-pay | Admitting: Radiation Oncology

## 2024-10-17 VITALS — BP 130/75 | HR 82 | Temp 98.3°F | Resp 16 | Wt 146.0 lb

## 2024-10-17 DIAGNOSIS — G4700 Insomnia, unspecified: Secondary | ICD-10-CM | POA: Diagnosis not present

## 2024-10-17 DIAGNOSIS — Z7951 Long term (current) use of inhaled steroids: Secondary | ICD-10-CM | POA: Diagnosis not present

## 2024-10-17 DIAGNOSIS — J449 Chronic obstructive pulmonary disease, unspecified: Secondary | ICD-10-CM | POA: Diagnosis not present

## 2024-10-17 DIAGNOSIS — G8929 Other chronic pain: Secondary | ICD-10-CM | POA: Insufficient documentation

## 2024-10-17 DIAGNOSIS — Z981 Arthrodesis status: Secondary | ICD-10-CM | POA: Insufficient documentation

## 2024-10-17 DIAGNOSIS — Z79899 Other long term (current) drug therapy: Secondary | ICD-10-CM | POA: Diagnosis not present

## 2024-10-17 DIAGNOSIS — R351 Nocturia: Secondary | ICD-10-CM | POA: Insufficient documentation

## 2024-10-17 DIAGNOSIS — Z809 Family history of malignant neoplasm, unspecified: Secondary | ICD-10-CM | POA: Insufficient documentation

## 2024-10-17 DIAGNOSIS — C61 Malignant neoplasm of prostate: Secondary | ICD-10-CM | POA: Insufficient documentation

## 2024-10-17 DIAGNOSIS — Z860100 Personal history of colon polyps, unspecified: Secondary | ICD-10-CM | POA: Insufficient documentation

## 2024-10-17 DIAGNOSIS — B029 Zoster without complications: Secondary | ICD-10-CM | POA: Diagnosis not present

## 2024-10-17 DIAGNOSIS — Z87891 Personal history of nicotine dependence: Secondary | ICD-10-CM | POA: Diagnosis not present

## 2024-10-17 NOTE — Consult Note (Signed)
 " NEW PATIENT EVALUATION  Name: Don Christian  MRN: [id number]  Date:   10/17/2024     DOB: 1956/06/02   This 69 y.o. male patient presents to the clinic for initial evaluation of stage clinical 2C (cT1c N0 M0) Gleason 8 (4+4) adenocarcinoma the prostate presented with a PSA in the 6 range.  REFERRING PHYSICIAN: Francisca Redell BROCKS, MD  CHIEF COMPLAINT:  Chief Complaint  Patient presents with   Prostate Cancer    DIAGNOSIS: The encounter diagnosis was Prostate cancer [facility]).   PREVIOUS INVESTIGATIONS:  PSMA PET scan and MRI scan reviewed Clinical notes reviewed Pathology report reviewed  HPI: Patient is a 69 year old male who presents with a slowly rising PSA most recently 5.  7 months prior.  MRI scan was ordered and showed a PI-RADS category 4 lesion of left posterior lateral peripheral zone with involvement of the left anterior peripheral zone.  No suspicious bone involvement was noted no evidence of lymphadenopathy was noted.  Seminal vesicles were unremarkable.  Patient with ongoing have UroNav biopsies showing 4 core biopsies bilateral involvement a mixture of Gleason 8 (4+4) and Gleason 7 (4+3 and 3+4).  4 of 8 cores were involved.  Patient went on to have a PSMA PET scan showing a focus of intense PSMA activity in the posterior left lobe of the prostate consistent with known prostate cancer no evidence of avid pelvic or abdominal lymph nodes or skeletal metastasis was noted.  Patient is fairly asymptomatic has nocturia x 1.  Some decreased stream consistent with his age.  No bowel problems and no bone pain.  He is now referred to radiation oncology for consideration of treatment.  Patient is already received a 24-month depot of Eligard from urology.  PLANNED TREATMENT REGIMEN: Image guided IMRT radiation therapy plus ADT therapy  PAST MEDICAL HISTORY:  has a past medical history of BPH (benign prostatic hyperplasia) (09/16/2014), Cataract, Chronic back pain (09/16/2014), Colon polyps,  COPD (chronic obstructive pulmonary disease) (HCC), DDD (degenerative disc disease), lumbar (09/16/2014), Full dentures, Inguinal hernia (2004), Insomnia (09/16/2014), Personal history of tobacco use, presenting hazards to health (03/10/2016), Prostate disorder, S/P lumbar spinal fusion (05/09/2015), Sleeping difficulties, Tobacco use disorder (09/16/2014), and Varicella zoster (11/19/2015).    PAST SURGICAL HISTORY:  Past Surgical History:  Procedure Laterality Date   ABDOMINAL EXPOSURE N/A 05/09/2015   Procedure: ABDOMINAL EXPOSURE;  Surgeon: Krystal JULIANNA Doing, MD;  Location: [facility]URO [facility]S;  Service: Vascular;  Laterality: N/A;   ANTERIOR LUMBAR FUSION N/A 05/09/2015   Procedure: LUMBAR FOUR-FIVE ANTERIOR LUMBAR FUSION WITH ABDOMINAL EXPOSURE;  Surgeon: Alm GORMAN Molt, MD;  Location: [facility] NEUR[facility];  Service: Neurosurgery;  Laterality: N/A;   COLONOSCOPY W/ BIOPSIES AND POLYPECTOMY     COLONOSCOPY WITH PROPOFOL  N/A 03/18/2017   Procedure: COLONOSCOPY WITH PROPOFOL ;  Surgeon: Therisa Bi, MD;  Location: [facility] ENDOSCOPY;  Service: Endoscopy;  Laterality: N/A;   FOOT SURGERY Right 2021   GANGLION CYST EXCISION     left leg   HERNIA REPAIR Left    inguinal at 14    TOOTH EXTRACTION      FAMILY HISTORY: family history includes Cancer in his father, mother, and sister; Heart disease in his mother.  SOCIAL HISTORY:  reports that he quit smoking about 5 years ago. His smoking use included cigars. He has been exposed to tobacco smoke. He has never used smokeless tobacco. He reports that he does not currently use alcohol. He reports that he does not use drugs.  ALLERGIES: Patient has  no known allergies.  MEDICATIONS:  Current Outpatient Medications  Medication Sig Dispense Refill   albuterol  (VENTOLIN  HFA) 108 (90 Base) MCG/ACT inhaler INHALE 2 PUFFS BY MOUTH EVERY 6 HOURS AS NEEDED FOR WHEEZING OR FOR SHORTNESS OF BREATH 8.5 g 0   budesonide -formoterol  (SYMBICORT ) 160-4.5 MCG/ACT inhaler Inhale 2 puffs into the  lungs 2 (two) times daily. 3 each 3   buPROPion  (ZYBAN ) 150 MG 12 hr tablet TAKE 1 TABLET BY MOUTH DAILY 90 tablet 2   DULoxetine  (CYMBALTA ) 20 MG capsule TAKE 1 CAPSULE BY MOUTH DAILY FOR ANXIETY AND DEPRESSION 90 capsule 2   pravastatin  (PRAVACHOL ) 40 MG tablet Take 1 tablet (40 mg total) by mouth daily. for cholesterol. 90 tablet 2   tadalafil  (CIALIS ) 10 MG tablet TAKE ONE TABLET BY MOUTH 60 MINUTES PRIOR TO INTERCOURSE AS NEEDED 30 tablet 0   traZODone  (DESYREL ) 100 MG tablet TAKE 1 TABLET BY MOUTH EVERY NIGHT AT BEDTIME FOR SLEEP 90 tablet 2   No current facility-administered medications for this encounter.    ECOG PERFORMANCE STATUS:  0 - Asymptomatic  REVIEW OF SYSTEMS: Patient denies any weight loss, fatigue, weakness, fever, chills or night sweats. Patient denies any loss of vision, blurred vision. Patient denies any ringing  of the ears or hearing loss. No irregular heartbeat. Patient denies heart murmur or history of fainting. Patient denies any chest pain or pain radiating to her upper extremities. Patient denies any shortness of breath, difficulty breathing at night, cough or hemoptysis. Patient denies any swelling in the lower legs. Patient denies any nausea vomiting, vomiting of blood, or coffee ground material in the vomitus. Patient denies any stomach pain. Patient states has had normal bowel movements no significant constipation or diarrhea. Patient denies any dysuria, hematuria or significant nocturia. Patient denies any problems walking, swelling in the joints or loss of balance. Patient denies any skin changes, loss of hair or loss of weight. Patient denies any excessive worrying or anxiety or significant depression. Patient denies any problems with insomnia. Patient denies excessive thirst, polyuria, polydipsia. Patient denies any swollen glands, patient denies easy bruising or easy bleeding. Patient denies any recent infections, allergies or URI. Patient s visual fields have not  changed significantly in recent time.   PHYSICAL EXAM: BP 130/75   Pulse 82   Temp 98.3 F (36.8 C) (Tympanic)   Resp 16   Wt 146 lb (66.2 kg)   BMI 19.80 kg/m  Well-developed well-nourished patient in NAD. HEENT reveals PERLA, EOMI, discs not visualized.  Oral cavity is clear. No oral mucosal lesions are identified. Neck is clear without evidence of cervical or supraclavicular adenopathy. Lungs are clear to A&P. Cardiac examination is essentially unremarkable with regular rate and rhythm without murmur rub or thrill. Abdomen is benign with no organomegaly or masses noted. Motor sensory and DTR levels are equal and symmetric in the upper and lower extremities. Cranial nerves II through XII are grossly intact. Proprioception is intact. No peripheral adenopathy or edema is identified. No motor or sensory levels are noted. Crude visual fields are within normal range.  LABORATORY DATA: Pathology reports reviewed    RADIOLOGY RESULTS: PSMA PET scan and MRI scans reviewed compatible with above-stated findings   IMPRESSION: Stage IIc adenocarcinoma the prostate in 69 year old male  PLAN: I have read the [facility] nomogram showing only a approximately 15% incidence of lymph node involvement.  Based on his PSMA PET showing no evidence of pelvic lymph nodes we will opt not to treat his  nodes.  At this time I have recommended image guided IMRT radiation therapy to his prostate.  Patient is not interested in surgical options.  I will plan on delivering 63 Gray over 8 weeks to the avid lesion in his prostate treating the remainder of the gland to 80 Gray in 8 weeks.  I would use IMRT treatment planning and delivery.  I have asked urology to place fiducial markers in his prostate for daily image guided treatment.  Risks and benefits of treatment including increased lower urinary tract symptoms possible diarrhea fatigue all were reviewed in detail with the patient.  Patient comprehends my  treatment plan well.  We will set up simulation once markers are placed.  Patient is already received his Eligard.    Marcey Penton, MD         "

## 2024-10-25 ENCOUNTER — Telehealth: Payer: Self-pay

## 2024-10-25 NOTE — Telephone Encounter (Signed)
 Auth Submission: APPROVED Site of care: Urology Payer: [employer] Medication & CPT/J Code(s) submitted: Eligard Diagnosis Code:  Route of submission (phone, fax, portal): fax Phone # Fax # Auth type: Buy/Bill PB Units/visits requested: 45mg  x 1 dose Reference number: [id number] P Approval from: 10/23/24 to 04/20/25

## 2024-11-02 ENCOUNTER — Encounter: Payer: Self-pay | Admitting: Urology

## 2024-11-02 ENCOUNTER — Ambulatory Visit: Admitting: Urology

## 2024-11-02 VITALS — BP 120/74 | HR 61 | Ht 72.0 in | Wt 146.0 lb

## 2024-11-02 DIAGNOSIS — C61 Malignant neoplasm of prostate: Secondary | ICD-10-CM

## 2024-11-02 DIAGNOSIS — Z2989 Encounter for other specified prophylactic measures: Secondary | ICD-10-CM | POA: Diagnosis not present

## 2024-11-02 MED ORDER — LEVOFLOXACIN 500 MG PO TABS
500.0000 mg | ORAL_TABLET | Freq: Once | ORAL | Status: AC
  Administered 2024-11-02: 500 mg via ORAL

## 2024-11-02 MED ORDER — GENTAMICIN SULFATE 40 MG/ML IJ SOLN
80.0000 mg | Freq: Once | INTRAMUSCULAR | Status: AC
  Administered 2024-11-02: 80 mg via INTRAMUSCULAR

## 2024-11-03 NOTE — Progress Notes (Signed)
" ° °  11/03/24  CC: gold fiducial marker placement  HPI: 69 y.o. male with high risk prostate cancer who presents today for placement of fiducial markers in anticipation of his upcoming IMRT with Dr. Lenn.  Prostate Gold fiducial Marker Placement Procedure   Informed consent was obtained after discussing risks/benefits of the procedure.  A time out was performed to ensure correct patient identity.  Pre-Procedure: - Gentamicin  given prophylactically - PO Levaquin  500 mg also given today  Procedure: - Rectal ultrasound probe was placed without difficulty and the prostate visualized - Prostatic block performed with 10 mL 1% Xylocaine  - 3 fiducial gold seed markers placed, one at right base, one at left base, one at apex of prostate gland under transrectal ultrasound guidance  Post-Procedure: - Patient tolerated the procedure well - He was counseled to seek immediate medical attention if experiences any severe pain, significant bleeding, or fevers  ADT was recommended by radiation oncology.  Patient stated today that he received a leuprolide  injection in radiation oncology however do not see this documented.  Will inquire with Dr. Glenwood office   Glendia Barba, MD  "

## 2024-11-05 ENCOUNTER — Ambulatory Visit

## 2024-11-06 ENCOUNTER — Ambulatory Visit
Admission: RE | Admit: 2024-11-06 | Discharge: 2024-11-06 | Attending: Radiation Oncology | Admitting: Radiation Oncology

## 2024-11-06 ENCOUNTER — Ambulatory Visit

## 2024-11-06 VITALS — BP 121/75 | HR 67 | Ht 72.0 in | Wt 146.0 lb

## 2024-11-06 DIAGNOSIS — C61 Malignant neoplasm of prostate: Secondary | ICD-10-CM | POA: Diagnosis not present

## 2024-11-06 MED ORDER — LEUPROLIDE ACETATE (6 MONTH) 45 MG ~~LOC~~ KIT
45.0000 mg | PACK | Freq: Once | SUBCUTANEOUS | Status: AC
  Administered 2024-11-06: 45 mg via SUBCUTANEOUS

## 2024-11-06 NOTE — Progress Notes (Signed)
 Eligard  SubQ Injection   Due to Prostate Cancer patient is present today for a Eligard  Injection.  Medication: Eligard  6 month Dose: 45 mg  Location: left arm Lot: [id number] Exp: 12/2025  Patient tolerated well, no complications were noted  Performed by: Beauford Browner, CCMA  Per Dr. Twylla patient is to continue therapy for 1 dose . Patient's next follow up was scheduled for 04/29/2025. This appointment was scheduled using wheel and given to patient today along with reminder continue on Vitamin D 800-1000iu and Calcium  1000-1200mg  daily while on Androgen Deprivation Therapy.  PA approval dates: 10/23/2024-04/20/2025 Approved Auth # [id number] P   Previous office note 10/17/2024 Dr. Marcey Penton stating patient previously received Eligard  injection from Urology. Patient did not receive Eligard  injection prior to 11/06/2024. 11/06/2024 first injection given.

## 2024-11-06 NOTE — Progress Notes (Signed)
 "  11/06/2024 12:13 PM   Derold Dorsch 1956/01/26 [id number]  CC: Chief Complaint  Patient presents with   Prostate Cancer    HPI: Khameron Gruenwald is a 69 y.o. male with high risk prostate cancer who presents today for ADT.   The patient presents for Eligard  teaching and initiation of treatment. He is being seen by Dr. Lenn with radiation oncology. He is set to begin image guided IMRT radiation therapy for his prostate cancer on 11/14/24. He will meet with Dr. Lenn every Tuesday following treatment.  He denies dysuria, hematuria, abdominal pain, bowel problems, bone pain, fever, chills, vomiting, or nausea.   PMH: Past Medical History:  Diagnosis Date   BPH (benign prostatic hyperplasia) 09/16/2014   Cataract    left eye   Chronic back pain 09/16/2014   Colon polyps    COPD (chronic obstructive pulmonary disease) (HCC)    mild   DDD (degenerative disc disease), lumbar 09/16/2014   Full dentures    Inguinal hernia 2004   Insomnia 09/16/2014   Personal history of tobacco use, presenting hazards to health 03/10/2016   Prostate disorder    S/P lumbar spinal fusion 05/09/2015   Sleeping difficulties    Tobacco use disorder 09/16/2014   Varicella zoster 11/19/2015    Surgical History: Past Surgical History:  Procedure Laterality Date   ABDOMINAL EXPOSURE N/A 05/09/2015   Procedure: ABDOMINAL EXPOSURE;  Surgeon: Krystal JULIANNA Doing, MD;  Location: [facility];  Service: Vascular;  Laterality: N/A;   ANTERIOR LUMBAR FUSION N/A 05/09/2015   Procedure: LUMBAR FOUR-FIVE ANTERIOR LUMBAR FUSION WITH ABDOMINAL EXPOSURE;  Surgeon: Alm GORMAN Molt, MD;  Location: [facility];  Service: Neurosurgery;  Laterality: N/A;   COLONOSCOPY W/ BIOPSIES AND POLYPECTOMY     COLONOSCOPY WITH PROPOFOL  N/A 03/18/2017   Procedure: COLONOSCOPY WITH PROPOFOL ;  Surgeon: Therisa Bi, MD;  Location: [facility];  Service: Endoscopy;  Laterality: N/A;   FOOT SURGERY Right 2021   GANGLION CYST EXCISION      left leg   HERNIA REPAIR Left    inguinal at 14    TOOTH EXTRACTION      Home Medications:  Allergies as of 11/06/2024   No Known Allergies      Medication List        Accurate as of November 06, 2024 12:13 PM. If you have any questions, ask your nurse or doctor.          albuterol  108 (90 Base) MCG/ACT inhaler Commonly known as: VENTOLIN  HFA INHALE 2 PUFFS BY MOUTH EVERY 6 HOURS AS NEEDED FOR WHEEZING OR FOR SHORTNESS OF BREATH   budesonide -formoterol  160-4.5 MCG/ACT inhaler Commonly known as: SYMBICORT  Inhale 2 puffs into the lungs 2 (two) times daily.   buPROPion  150 MG 12 hr tablet Commonly known as: ZYBAN  TAKE 1 TABLET BY MOUTH DAILY   DULoxetine  20 MG capsule Commonly known as: CYMBALTA  TAKE 1 CAPSULE BY MOUTH DAILY FOR ANXIETY AND DEPRESSION   pravastatin  40 MG tablet Commonly known as: PRAVACHOL  Take 1 tablet (40 mg total) by mouth daily. for cholesterol.   tadalafil  10 MG tablet Commonly known as: CIALIS  TAKE ONE TABLET BY MOUTH 60 MINUTES PRIOR TO INTERCOURSE AS NEEDED   traZODone  100 MG tablet Commonly known as: DESYREL  TAKE 1 TABLET BY MOUTH EVERY NIGHT AT BEDTIME FOR SLEEP        Allergies:  Allergies[1]  Family History: Family History  Problem Relation Age of Onset   Cancer Mother  Lung   Heart disease Mother    Cancer Father        Lung with mets to brain   Cancer Sister        Lung    Social History:   reports that he quit smoking about 6 years ago. His smoking use included cigars. He has been exposed to tobacco smoke. He has never used smokeless tobacco. He reports that he does not currently use alcohol. He reports that he does not use drugs.  Physical Exam: BP 121/75 (BP Location: Left Arm, Patient Position: Sitting, Cuff Size: Normal)   Pulse 67   Ht 6' (1.829 m)   Wt 146 lb (66.2 kg)   SpO2 100%   BMI 19.80 kg/m   Constitutional:  Alert and oriented, no acute distress, nontoxic appearing HEENT: [place],  AT Cardiovascular: No clubbing, cyanosis, or edema Respiratory: Normal respiratory effort, no increased work of breathing Skin: No rashes, bruises or suspicious lesions Neurologic: Grossly intact, no focal deficits, moving all 4 extremities Psychiatric: Normal mood and affect  Laboratory Data: Lab Results  Component Value Date   WBC 6.1 05/17/2024   HGB 14.0 05/17/2024   HCT 43.3 05/17/2024   MCV 90.7 05/17/2024   PLT 188.0 05/17/2024    Lab Results  Component Value Date   CREATININE 0.90 05/17/2024    CrCl cannot be calculated (Patient's most recent lab result is older than the maximum 21 days allowed.).  Results for orders placed or performed in visit on 08/23/24  PROSTATE CORE NEEDLE BIOPSY   Collection Time: 08/23/24 12:00 AM  Result Value Ref Range   PROSTATE CORE NEEDLE BIOPSY Comment (A)     Pertinent Imaging: NM PET (PSMA) SKULL TO MID THIGH 09/19/24: IMPRESSION: 1. Focus of intense PSMA activity in the posterior left lobe of the prostate gland, consistent with known prostate cancer. 2. No PSMA-avid pelvic or abdominal lymph nodes. 3. No PSMA-avid skeletal metastases.  MR PROSTATE W WO CONTRAST 08/13/24: IMPRESSION: 1. ROI 1: PI-RADS category 4 lesion in the left posterolateral peripheral zone (mid gland and apex) with involvement of the left anterior peripheral zone (mid gland), measuring 0.49 cc, demonstrating abnormal low T2 signal, reduced ADC, restricted diffusion, and focal early enhancement. Targeting data sent to the [facility] system. 2. Hazy low T2 signal in the peripheral zone, likely postinflammatory, PI-RADS category 2. 3. Motion artifact reduces diagnostic sensitivity and specificity.  I personally reviewed the images referenced above and note.  Assessment & Plan:  Filemon Breton is a 69 y.o. male with high risk prostate cancer who presents today for Eligard  injection. Risks and benefits discussed. Patient understands and is agreeable to plan.     1. Prostate cancer (HCC) (Primary) - Leuprolide  (6 Month) (ELIGARD ) injection 45 mg - Discussed with patient injection site reactions could include transient burning and stinging, pain, bruising and redness. - Discussed with patient that common side effects include hot flashes, sweats, fatigue, weakness, muscle pain, dizziness, clamminess, testicular shrinkage, decreased erections and enlargement of breasts. - Discussed the side effect of thinning of the bones that may lead to a fracture and it is important that he start calcium  supplementation with vitamin D. - Discussed the increased risks of heart attack, irregular heartbeat, sudden death due to heart problems and stroke. - Discussed that elevated blood sugar and increased risk developing diabetes may occur. - General health maintenance - coordination with primary care for cardiovascular and metabolic monitoring. - Therapy monitoring - requires ongoing surveillance for treatment-related effects (fatigue,  falls, hypertension, bone health).   Return in 6 months with PSA, or sooner for new pain, neurologic symptoms, or urinary changes.  Marry MALVA Sara, PA-C  [facility] [address] [place], [place] [address] [phone]      [1] No Known Allergies  "

## 2024-11-06 NOTE — Progress Notes (Signed)
 Approved [facility]ara #[id number] P Dates: 10/23/24 - 04/20/25

## 2024-11-08 ENCOUNTER — Other Ambulatory Visit: Payer: Self-pay | Admitting: *Deleted

## 2024-11-08 DIAGNOSIS — C61 Malignant neoplasm of prostate: Secondary | ICD-10-CM

## 2024-11-14 ENCOUNTER — Ambulatory Visit

## 2024-11-15 ENCOUNTER — Ambulatory Visit

## 2024-11-16 ENCOUNTER — Ambulatory Visit

## 2024-11-19 ENCOUNTER — Ambulatory Visit

## 2024-11-20 ENCOUNTER — Ambulatory Visit

## 2024-11-21 ENCOUNTER — Inpatient Hospital Stay

## 2024-11-21 ENCOUNTER — Ambulatory Visit

## 2024-11-21 ENCOUNTER — Ambulatory Visit: Payer: No Typology Code available for payment source

## 2024-11-22 ENCOUNTER — Ambulatory Visit

## 2024-11-23 ENCOUNTER — Ambulatory Visit

## 2024-11-26 ENCOUNTER — Ambulatory Visit

## 2024-11-27 ENCOUNTER — Ambulatory Visit

## 2024-11-28 ENCOUNTER — Ambulatory Visit

## 2024-11-29 ENCOUNTER — Ambulatory Visit

## 2024-11-30 ENCOUNTER — Ambulatory Visit

## 2024-12-03 ENCOUNTER — Ambulatory Visit

## 2024-12-04 ENCOUNTER — Ambulatory Visit

## 2024-12-05 ENCOUNTER — Inpatient Hospital Stay

## 2024-12-05 ENCOUNTER — Ambulatory Visit

## 2024-12-06 ENCOUNTER — Ambulatory Visit

## 2024-12-07 ENCOUNTER — Ambulatory Visit

## 2024-12-10 ENCOUNTER — Ambulatory Visit

## 2024-12-11 ENCOUNTER — Ambulatory Visit

## 2024-12-11 ENCOUNTER — Ambulatory Visit: Admitting: Primary Care

## 2024-12-12 ENCOUNTER — Ambulatory Visit

## 2024-12-13 ENCOUNTER — Ambulatory Visit

## 2024-12-14 ENCOUNTER — Ambulatory Visit

## 2024-12-17 ENCOUNTER — Ambulatory Visit: Admitting: Family Medicine

## 2024-12-17 ENCOUNTER — Ambulatory Visit

## 2024-12-18 ENCOUNTER — Ambulatory Visit

## 2024-12-19 ENCOUNTER — Ambulatory Visit

## 2024-12-19 ENCOUNTER — Inpatient Hospital Stay

## 2024-12-20 ENCOUNTER — Ambulatory Visit

## 2024-12-21 ENCOUNTER — Ambulatory Visit

## 2024-12-24 ENCOUNTER — Ambulatory Visit

## 2024-12-25 ENCOUNTER — Ambulatory Visit

## 2024-12-26 ENCOUNTER — Ambulatory Visit

## 2024-12-27 ENCOUNTER — Ambulatory Visit

## 2024-12-28 ENCOUNTER — Ambulatory Visit

## 2024-12-31 ENCOUNTER — Ambulatory Visit

## 2025-01-01 ENCOUNTER — Ambulatory Visit

## 2025-01-02 ENCOUNTER — Ambulatory Visit

## 2025-01-02 ENCOUNTER — Inpatient Hospital Stay

## 2025-01-03 ENCOUNTER — Ambulatory Visit

## 2025-01-04 ENCOUNTER — Ambulatory Visit

## 2025-01-07 ENCOUNTER — Ambulatory Visit

## 2025-01-08 ENCOUNTER — Ambulatory Visit

## 2025-01-09 ENCOUNTER — Ambulatory Visit

## 2025-04-22 ENCOUNTER — Other Ambulatory Visit

## 2025-04-29 ENCOUNTER — Ambulatory Visit
